# Patient Record
Sex: Male | Born: 1945 | Race: White | Hispanic: No | Marital: Married | Smoking: Former smoker
Health system: Southern US, Community
[De-identification: ages and names within clinical notes are randomized; demographics above are authoritative.]

## PROBLEM LIST (undated history)

## (undated) DIAGNOSIS — C801 Malignant (primary) neoplasm, unspecified: Secondary | ICD-10-CM

## (undated) DIAGNOSIS — G4700 Insomnia, unspecified: Secondary | ICD-10-CM

## (undated) DIAGNOSIS — F319 Bipolar disorder, unspecified: Secondary | ICD-10-CM

## (undated) DIAGNOSIS — F329 Major depressive disorder, single episode, unspecified: Secondary | ICD-10-CM

## (undated) DIAGNOSIS — R531 Weakness: Secondary | ICD-10-CM

## (undated) DIAGNOSIS — K5900 Constipation, unspecified: Secondary | ICD-10-CM

## (undated) DIAGNOSIS — K449 Diaphragmatic hernia without obstruction or gangrene: Secondary | ICD-10-CM

## (undated) DIAGNOSIS — G629 Polyneuropathy, unspecified: Secondary | ICD-10-CM

## (undated) DIAGNOSIS — R251 Tremor, unspecified: Secondary | ICD-10-CM

## (undated) DIAGNOSIS — R41841 Cognitive communication deficit: Secondary | ICD-10-CM

## (undated) DIAGNOSIS — F32A Depression, unspecified: Secondary | ICD-10-CM

## (undated) DIAGNOSIS — M75100 Unspecified rotator cuff tear or rupture of unspecified shoulder, not specified as traumatic: Secondary | ICD-10-CM

## (undated) DIAGNOSIS — J449 Chronic obstructive pulmonary disease, unspecified: Secondary | ICD-10-CM

## (undated) DIAGNOSIS — F419 Anxiety disorder, unspecified: Secondary | ICD-10-CM

## (undated) DIAGNOSIS — B1920 Unspecified viral hepatitis C without hepatic coma: Secondary | ICD-10-CM

## (undated) DIAGNOSIS — J45909 Unspecified asthma, uncomplicated: Secondary | ICD-10-CM

## (undated) DIAGNOSIS — E559 Vitamin D deficiency, unspecified: Secondary | ICD-10-CM

## (undated) DIAGNOSIS — Z8619 Personal history of other infectious and parasitic diseases: Secondary | ICD-10-CM

## (undated) DIAGNOSIS — M1990 Unspecified osteoarthritis, unspecified site: Secondary | ICD-10-CM

## (undated) DIAGNOSIS — E785 Hyperlipidemia, unspecified: Secondary | ICD-10-CM

## (undated) DIAGNOSIS — H9190 Unspecified hearing loss, unspecified ear: Secondary | ICD-10-CM

## (undated) DIAGNOSIS — I10 Essential (primary) hypertension: Secondary | ICD-10-CM

## (undated) DIAGNOSIS — E119 Type 2 diabetes mellitus without complications: Secondary | ICD-10-CM

## (undated) DIAGNOSIS — N138 Other obstructive and reflux uropathy: Secondary | ICD-10-CM

## (undated) DIAGNOSIS — N401 Enlarged prostate with lower urinary tract symptoms: Secondary | ICD-10-CM

## (undated) DIAGNOSIS — J189 Pneumonia, unspecified organism: Secondary | ICD-10-CM

## (undated) DIAGNOSIS — Z86711 Personal history of pulmonary embolism: Secondary | ICD-10-CM

## (undated) DIAGNOSIS — K219 Gastro-esophageal reflux disease without esophagitis: Secondary | ICD-10-CM

## (undated) HISTORY — PX: TONGUE SURGERY: SHX810

## (undated) HISTORY — PX: TRANSURETHRAL RESECTION OF PROSTATE: SHX73

## (undated) HISTORY — PX: ORIF FEMUR FRACTURE: SHX2119

---

## 2007-11-05 ENCOUNTER — Ambulatory Visit: Payer: Self-pay | Admitting: Cardiology

## 2011-04-16 DIAGNOSIS — L259 Unspecified contact dermatitis, unspecified cause: Secondary | ICD-10-CM | POA: Diagnosis not present

## 2011-04-16 DIAGNOSIS — J449 Chronic obstructive pulmonary disease, unspecified: Secondary | ICD-10-CM | POA: Diagnosis not present

## 2011-04-16 DIAGNOSIS — R079 Chest pain, unspecified: Secondary | ICD-10-CM | POA: Diagnosis not present

## 2011-04-25 DIAGNOSIS — R0602 Shortness of breath: Secondary | ICD-10-CM | POA: Diagnosis not present

## 2011-04-25 DIAGNOSIS — F411 Generalized anxiety disorder: Secondary | ICD-10-CM | POA: Diagnosis not present

## 2011-04-25 DIAGNOSIS — R5381 Other malaise: Secondary | ICD-10-CM | POA: Diagnosis not present

## 2011-04-27 DIAGNOSIS — R0602 Shortness of breath: Secondary | ICD-10-CM | POA: Diagnosis not present

## 2011-04-27 DIAGNOSIS — F329 Major depressive disorder, single episode, unspecified: Secondary | ICD-10-CM | POA: Diagnosis not present

## 2011-04-27 DIAGNOSIS — J189 Pneumonia, unspecified organism: Secondary | ICD-10-CM | POA: Diagnosis not present

## 2011-04-27 DIAGNOSIS — R5383 Other fatigue: Secondary | ICD-10-CM | POA: Diagnosis not present

## 2011-04-27 DIAGNOSIS — R5381 Other malaise: Secondary | ICD-10-CM | POA: Diagnosis not present

## 2011-06-18 DIAGNOSIS — E782 Mixed hyperlipidemia: Secondary | ICD-10-CM | POA: Diagnosis not present

## 2011-06-18 DIAGNOSIS — J449 Chronic obstructive pulmonary disease, unspecified: Secondary | ICD-10-CM | POA: Diagnosis not present

## 2011-06-18 DIAGNOSIS — L240 Irritant contact dermatitis due to detergents: Secondary | ICD-10-CM | POA: Diagnosis not present

## 2011-06-18 DIAGNOSIS — I824Z9 Acute embolism and thrombosis of unspecified deep veins of unspecified distal lower extremity: Secondary | ICD-10-CM | POA: Diagnosis not present

## 2011-07-07 DIAGNOSIS — L738 Other specified follicular disorders: Secondary | ICD-10-CM | POA: Diagnosis present

## 2011-07-07 DIAGNOSIS — I509 Heart failure, unspecified: Secondary | ICD-10-CM | POA: Diagnosis present

## 2011-07-07 DIAGNOSIS — Z85810 Personal history of malignant neoplasm of tongue: Secondary | ICD-10-CM | POA: Diagnosis not present

## 2011-07-07 DIAGNOSIS — Z9981 Dependence on supplemental oxygen: Secondary | ICD-10-CM | POA: Diagnosis not present

## 2011-07-07 DIAGNOSIS — E119 Type 2 diabetes mellitus without complications: Secondary | ICD-10-CM | POA: Diagnosis present

## 2011-07-07 DIAGNOSIS — N400 Enlarged prostate without lower urinary tract symptoms: Secondary | ICD-10-CM | POA: Diagnosis not present

## 2011-07-07 DIAGNOSIS — Z86711 Personal history of pulmonary embolism: Secondary | ICD-10-CM | POA: Diagnosis not present

## 2011-07-07 DIAGNOSIS — I10 Essential (primary) hypertension: Secondary | ICD-10-CM | POA: Diagnosis not present

## 2011-07-07 DIAGNOSIS — Z87891 Personal history of nicotine dependence: Secondary | ICD-10-CM | POA: Diagnosis not present

## 2011-07-07 DIAGNOSIS — E785 Hyperlipidemia, unspecified: Secondary | ICD-10-CM | POA: Diagnosis present

## 2011-07-07 DIAGNOSIS — J984 Other disorders of lung: Secondary | ICD-10-CM | POA: Diagnosis not present

## 2011-07-07 DIAGNOSIS — I2782 Chronic pulmonary embolism: Secondary | ICD-10-CM | POA: Diagnosis not present

## 2011-07-07 DIAGNOSIS — Z9119 Patient's noncompliance with other medical treatment and regimen: Secondary | ICD-10-CM | POA: Diagnosis not present

## 2011-07-07 DIAGNOSIS — K449 Diaphragmatic hernia without obstruction or gangrene: Secondary | ICD-10-CM | POA: Diagnosis present

## 2011-07-07 DIAGNOSIS — J441 Chronic obstructive pulmonary disease with (acute) exacerbation: Secondary | ICD-10-CM | POA: Diagnosis not present

## 2011-07-07 DIAGNOSIS — R0602 Shortness of breath: Secondary | ICD-10-CM | POA: Diagnosis not present

## 2011-07-07 DIAGNOSIS — B370 Candidal stomatitis: Secondary | ICD-10-CM | POA: Diagnosis not present

## 2011-07-07 DIAGNOSIS — Z79899 Other long term (current) drug therapy: Secondary | ICD-10-CM | POA: Diagnosis not present

## 2011-07-07 DIAGNOSIS — F411 Generalized anxiety disorder: Secondary | ICD-10-CM | POA: Diagnosis not present

## 2011-07-07 DIAGNOSIS — R5381 Other malaise: Secondary | ICD-10-CM | POA: Diagnosis present

## 2011-07-07 DIAGNOSIS — R918 Other nonspecific abnormal finding of lung field: Secondary | ICD-10-CM | POA: Diagnosis not present

## 2011-07-07 DIAGNOSIS — F319 Bipolar disorder, unspecified: Secondary | ICD-10-CM | POA: Diagnosis present

## 2011-07-14 DIAGNOSIS — F411 Generalized anxiety disorder: Secondary | ICD-10-CM | POA: Diagnosis not present

## 2011-07-14 DIAGNOSIS — J441 Chronic obstructive pulmonary disease with (acute) exacerbation: Secondary | ICD-10-CM | POA: Diagnosis not present

## 2011-07-14 DIAGNOSIS — J984 Other disorders of lung: Secondary | ICD-10-CM | POA: Diagnosis not present

## 2011-07-14 DIAGNOSIS — R0602 Shortness of breath: Secondary | ICD-10-CM | POA: Diagnosis not present

## 2011-07-16 DIAGNOSIS — J449 Chronic obstructive pulmonary disease, unspecified: Secondary | ICD-10-CM | POA: Diagnosis not present

## 2011-07-17 DIAGNOSIS — I10 Essential (primary) hypertension: Secondary | ICD-10-CM | POA: Diagnosis not present

## 2011-07-17 DIAGNOSIS — S51809A Unspecified open wound of unspecified forearm, initial encounter: Secondary | ICD-10-CM | POA: Diagnosis not present

## 2011-07-17 DIAGNOSIS — E119 Type 2 diabetes mellitus without complications: Secondary | ICD-10-CM | POA: Diagnosis not present

## 2011-07-17 DIAGNOSIS — I2699 Other pulmonary embolism without acute cor pulmonale: Secondary | ICD-10-CM | POA: Diagnosis not present

## 2011-07-17 DIAGNOSIS — F319 Bipolar disorder, unspecified: Secondary | ICD-10-CM | POA: Diagnosis not present

## 2011-07-17 DIAGNOSIS — J441 Chronic obstructive pulmonary disease with (acute) exacerbation: Secondary | ICD-10-CM | POA: Diagnosis not present

## 2011-07-20 DIAGNOSIS — I2699 Other pulmonary embolism without acute cor pulmonale: Secondary | ICD-10-CM | POA: Diagnosis not present

## 2011-07-20 DIAGNOSIS — F319 Bipolar disorder, unspecified: Secondary | ICD-10-CM | POA: Diagnosis not present

## 2011-07-20 DIAGNOSIS — I10 Essential (primary) hypertension: Secondary | ICD-10-CM | POA: Diagnosis not present

## 2011-07-20 DIAGNOSIS — S51809A Unspecified open wound of unspecified forearm, initial encounter: Secondary | ICD-10-CM | POA: Diagnosis not present

## 2011-07-20 DIAGNOSIS — J441 Chronic obstructive pulmonary disease with (acute) exacerbation: Secondary | ICD-10-CM | POA: Diagnosis not present

## 2011-07-20 DIAGNOSIS — E119 Type 2 diabetes mellitus without complications: Secondary | ICD-10-CM | POA: Diagnosis not present

## 2011-07-22 DIAGNOSIS — I2699 Other pulmonary embolism without acute cor pulmonale: Secondary | ICD-10-CM | POA: Diagnosis not present

## 2011-07-22 DIAGNOSIS — I10 Essential (primary) hypertension: Secondary | ICD-10-CM | POA: Diagnosis not present

## 2011-07-22 DIAGNOSIS — S51809A Unspecified open wound of unspecified forearm, initial encounter: Secondary | ICD-10-CM | POA: Diagnosis not present

## 2011-07-22 DIAGNOSIS — J441 Chronic obstructive pulmonary disease with (acute) exacerbation: Secondary | ICD-10-CM | POA: Diagnosis not present

## 2011-07-22 DIAGNOSIS — E119 Type 2 diabetes mellitus without complications: Secondary | ICD-10-CM | POA: Diagnosis not present

## 2011-07-22 DIAGNOSIS — F319 Bipolar disorder, unspecified: Secondary | ICD-10-CM | POA: Diagnosis not present

## 2011-07-25 DIAGNOSIS — I2699 Other pulmonary embolism without acute cor pulmonale: Secondary | ICD-10-CM | POA: Diagnosis not present

## 2011-07-25 DIAGNOSIS — I10 Essential (primary) hypertension: Secondary | ICD-10-CM | POA: Diagnosis not present

## 2011-07-25 DIAGNOSIS — F319 Bipolar disorder, unspecified: Secondary | ICD-10-CM | POA: Diagnosis not present

## 2011-07-25 DIAGNOSIS — S51809A Unspecified open wound of unspecified forearm, initial encounter: Secondary | ICD-10-CM | POA: Diagnosis not present

## 2011-07-25 DIAGNOSIS — J441 Chronic obstructive pulmonary disease with (acute) exacerbation: Secondary | ICD-10-CM | POA: Diagnosis not present

## 2011-07-25 DIAGNOSIS — E119 Type 2 diabetes mellitus without complications: Secondary | ICD-10-CM | POA: Diagnosis not present

## 2011-07-29 DIAGNOSIS — I2699 Other pulmonary embolism without acute cor pulmonale: Secondary | ICD-10-CM | POA: Diagnosis not present

## 2011-07-29 DIAGNOSIS — F319 Bipolar disorder, unspecified: Secondary | ICD-10-CM | POA: Diagnosis not present

## 2011-07-29 DIAGNOSIS — S51809A Unspecified open wound of unspecified forearm, initial encounter: Secondary | ICD-10-CM | POA: Diagnosis not present

## 2011-07-29 DIAGNOSIS — E119 Type 2 diabetes mellitus without complications: Secondary | ICD-10-CM | POA: Diagnosis not present

## 2011-07-29 DIAGNOSIS — J441 Chronic obstructive pulmonary disease with (acute) exacerbation: Secondary | ICD-10-CM | POA: Diagnosis not present

## 2011-07-29 DIAGNOSIS — I10 Essential (primary) hypertension: Secondary | ICD-10-CM | POA: Diagnosis not present

## 2011-08-01 DIAGNOSIS — E119 Type 2 diabetes mellitus without complications: Secondary | ICD-10-CM | POA: Diagnosis not present

## 2011-08-01 DIAGNOSIS — F319 Bipolar disorder, unspecified: Secondary | ICD-10-CM | POA: Diagnosis not present

## 2011-08-01 DIAGNOSIS — I10 Essential (primary) hypertension: Secondary | ICD-10-CM | POA: Diagnosis not present

## 2011-08-01 DIAGNOSIS — J441 Chronic obstructive pulmonary disease with (acute) exacerbation: Secondary | ICD-10-CM | POA: Diagnosis not present

## 2011-08-01 DIAGNOSIS — S51809A Unspecified open wound of unspecified forearm, initial encounter: Secondary | ICD-10-CM | POA: Diagnosis not present

## 2011-08-01 DIAGNOSIS — I2699 Other pulmonary embolism without acute cor pulmonale: Secondary | ICD-10-CM | POA: Diagnosis not present

## 2011-08-05 DIAGNOSIS — S51809A Unspecified open wound of unspecified forearm, initial encounter: Secondary | ICD-10-CM | POA: Diagnosis not present

## 2011-08-05 DIAGNOSIS — F319 Bipolar disorder, unspecified: Secondary | ICD-10-CM | POA: Diagnosis not present

## 2011-08-05 DIAGNOSIS — I2699 Other pulmonary embolism without acute cor pulmonale: Secondary | ICD-10-CM | POA: Diagnosis not present

## 2011-08-05 DIAGNOSIS — I10 Essential (primary) hypertension: Secondary | ICD-10-CM | POA: Diagnosis not present

## 2011-08-05 DIAGNOSIS — J441 Chronic obstructive pulmonary disease with (acute) exacerbation: Secondary | ICD-10-CM | POA: Diagnosis not present

## 2011-08-05 DIAGNOSIS — E119 Type 2 diabetes mellitus without complications: Secondary | ICD-10-CM | POA: Diagnosis not present

## 2011-08-07 DIAGNOSIS — F319 Bipolar disorder, unspecified: Secondary | ICD-10-CM | POA: Diagnosis not present

## 2011-08-07 DIAGNOSIS — E119 Type 2 diabetes mellitus without complications: Secondary | ICD-10-CM | POA: Diagnosis not present

## 2011-08-07 DIAGNOSIS — I10 Essential (primary) hypertension: Secondary | ICD-10-CM | POA: Diagnosis not present

## 2011-08-07 DIAGNOSIS — I2699 Other pulmonary embolism without acute cor pulmonale: Secondary | ICD-10-CM | POA: Diagnosis not present

## 2011-08-07 DIAGNOSIS — S51809A Unspecified open wound of unspecified forearm, initial encounter: Secondary | ICD-10-CM | POA: Diagnosis not present

## 2011-08-07 DIAGNOSIS — J441 Chronic obstructive pulmonary disease with (acute) exacerbation: Secondary | ICD-10-CM | POA: Diagnosis not present

## 2011-08-12 DIAGNOSIS — S51809A Unspecified open wound of unspecified forearm, initial encounter: Secondary | ICD-10-CM | POA: Diagnosis not present

## 2011-08-12 DIAGNOSIS — J441 Chronic obstructive pulmonary disease with (acute) exacerbation: Secondary | ICD-10-CM | POA: Diagnosis not present

## 2011-08-12 DIAGNOSIS — F319 Bipolar disorder, unspecified: Secondary | ICD-10-CM | POA: Diagnosis not present

## 2011-08-12 DIAGNOSIS — I2699 Other pulmonary embolism without acute cor pulmonale: Secondary | ICD-10-CM | POA: Diagnosis not present

## 2011-08-12 DIAGNOSIS — E119 Type 2 diabetes mellitus without complications: Secondary | ICD-10-CM | POA: Diagnosis not present

## 2011-08-12 DIAGNOSIS — I10 Essential (primary) hypertension: Secondary | ICD-10-CM | POA: Diagnosis not present

## 2011-08-14 DIAGNOSIS — F319 Bipolar disorder, unspecified: Secondary | ICD-10-CM | POA: Diagnosis not present

## 2011-08-14 DIAGNOSIS — I10 Essential (primary) hypertension: Secondary | ICD-10-CM | POA: Diagnosis not present

## 2011-08-14 DIAGNOSIS — E119 Type 2 diabetes mellitus without complications: Secondary | ICD-10-CM | POA: Diagnosis not present

## 2011-08-14 DIAGNOSIS — J441 Chronic obstructive pulmonary disease with (acute) exacerbation: Secondary | ICD-10-CM | POA: Diagnosis not present

## 2011-08-14 DIAGNOSIS — S51809A Unspecified open wound of unspecified forearm, initial encounter: Secondary | ICD-10-CM | POA: Diagnosis not present

## 2011-08-14 DIAGNOSIS — I2699 Other pulmonary embolism without acute cor pulmonale: Secondary | ICD-10-CM | POA: Diagnosis not present

## 2011-08-20 DIAGNOSIS — I2699 Other pulmonary embolism without acute cor pulmonale: Secondary | ICD-10-CM | POA: Diagnosis not present

## 2011-08-20 DIAGNOSIS — E119 Type 2 diabetes mellitus without complications: Secondary | ICD-10-CM | POA: Diagnosis not present

## 2011-08-20 DIAGNOSIS — F319 Bipolar disorder, unspecified: Secondary | ICD-10-CM | POA: Diagnosis not present

## 2011-08-20 DIAGNOSIS — S51809A Unspecified open wound of unspecified forearm, initial encounter: Secondary | ICD-10-CM | POA: Diagnosis not present

## 2011-08-20 DIAGNOSIS — J441 Chronic obstructive pulmonary disease with (acute) exacerbation: Secondary | ICD-10-CM | POA: Diagnosis not present

## 2011-08-20 DIAGNOSIS — I10 Essential (primary) hypertension: Secondary | ICD-10-CM | POA: Diagnosis not present

## 2011-08-21 DIAGNOSIS — F319 Bipolar disorder, unspecified: Secondary | ICD-10-CM | POA: Diagnosis not present

## 2011-08-21 DIAGNOSIS — I10 Essential (primary) hypertension: Secondary | ICD-10-CM | POA: Diagnosis not present

## 2011-08-21 DIAGNOSIS — E119 Type 2 diabetes mellitus without complications: Secondary | ICD-10-CM | POA: Diagnosis not present

## 2011-08-21 DIAGNOSIS — S51809A Unspecified open wound of unspecified forearm, initial encounter: Secondary | ICD-10-CM | POA: Diagnosis not present

## 2011-08-21 DIAGNOSIS — J441 Chronic obstructive pulmonary disease with (acute) exacerbation: Secondary | ICD-10-CM | POA: Diagnosis not present

## 2011-08-21 DIAGNOSIS — I2699 Other pulmonary embolism without acute cor pulmonale: Secondary | ICD-10-CM | POA: Diagnosis not present

## 2011-08-23 DIAGNOSIS — F319 Bipolar disorder, unspecified: Secondary | ICD-10-CM | POA: Diagnosis not present

## 2011-08-23 DIAGNOSIS — I2699 Other pulmonary embolism without acute cor pulmonale: Secondary | ICD-10-CM | POA: Diagnosis not present

## 2011-08-23 DIAGNOSIS — I10 Essential (primary) hypertension: Secondary | ICD-10-CM | POA: Diagnosis not present

## 2011-08-23 DIAGNOSIS — S51809A Unspecified open wound of unspecified forearm, initial encounter: Secondary | ICD-10-CM | POA: Diagnosis not present

## 2011-08-23 DIAGNOSIS — E119 Type 2 diabetes mellitus without complications: Secondary | ICD-10-CM | POA: Diagnosis not present

## 2011-08-23 DIAGNOSIS — J441 Chronic obstructive pulmonary disease with (acute) exacerbation: Secondary | ICD-10-CM | POA: Diagnosis not present

## 2011-08-26 DIAGNOSIS — J441 Chronic obstructive pulmonary disease with (acute) exacerbation: Secondary | ICD-10-CM | POA: Diagnosis not present

## 2011-08-26 DIAGNOSIS — F319 Bipolar disorder, unspecified: Secondary | ICD-10-CM | POA: Diagnosis not present

## 2011-08-26 DIAGNOSIS — E119 Type 2 diabetes mellitus without complications: Secondary | ICD-10-CM | POA: Diagnosis not present

## 2011-08-26 DIAGNOSIS — I10 Essential (primary) hypertension: Secondary | ICD-10-CM | POA: Diagnosis not present

## 2011-08-26 DIAGNOSIS — S51809A Unspecified open wound of unspecified forearm, initial encounter: Secondary | ICD-10-CM | POA: Diagnosis not present

## 2011-08-26 DIAGNOSIS — I2699 Other pulmonary embolism without acute cor pulmonale: Secondary | ICD-10-CM | POA: Diagnosis not present

## 2011-08-29 DIAGNOSIS — F319 Bipolar disorder, unspecified: Secondary | ICD-10-CM | POA: Diagnosis not present

## 2011-08-29 DIAGNOSIS — I2699 Other pulmonary embolism without acute cor pulmonale: Secondary | ICD-10-CM | POA: Diagnosis not present

## 2011-08-29 DIAGNOSIS — I10 Essential (primary) hypertension: Secondary | ICD-10-CM | POA: Diagnosis not present

## 2011-08-29 DIAGNOSIS — E119 Type 2 diabetes mellitus without complications: Secondary | ICD-10-CM | POA: Diagnosis not present

## 2011-08-29 DIAGNOSIS — S51809A Unspecified open wound of unspecified forearm, initial encounter: Secondary | ICD-10-CM | POA: Diagnosis not present

## 2011-08-29 DIAGNOSIS — J441 Chronic obstructive pulmonary disease with (acute) exacerbation: Secondary | ICD-10-CM | POA: Diagnosis not present

## 2011-08-31 DIAGNOSIS — K449 Diaphragmatic hernia without obstruction or gangrene: Secondary | ICD-10-CM | POA: Diagnosis not present

## 2011-08-31 DIAGNOSIS — I509 Heart failure, unspecified: Secondary | ICD-10-CM | POA: Diagnosis present

## 2011-08-31 DIAGNOSIS — I2789 Other specified pulmonary heart diseases: Secondary | ICD-10-CM | POA: Diagnosis not present

## 2011-08-31 DIAGNOSIS — R0789 Other chest pain: Secondary | ICD-10-CM | POA: Diagnosis not present

## 2011-08-31 DIAGNOSIS — Z79899 Other long term (current) drug therapy: Secondary | ICD-10-CM | POA: Diagnosis not present

## 2011-08-31 DIAGNOSIS — Z87891 Personal history of nicotine dependence: Secondary | ICD-10-CM | POA: Diagnosis not present

## 2011-08-31 DIAGNOSIS — E119 Type 2 diabetes mellitus without complications: Secondary | ICD-10-CM | POA: Diagnosis present

## 2011-08-31 DIAGNOSIS — N400 Enlarged prostate without lower urinary tract symptoms: Secondary | ICD-10-CM | POA: Diagnosis present

## 2011-08-31 DIAGNOSIS — J449 Chronic obstructive pulmonary disease, unspecified: Secondary | ICD-10-CM | POA: Diagnosis not present

## 2011-08-31 DIAGNOSIS — Z91013 Allergy to seafood: Secondary | ICD-10-CM | POA: Diagnosis not present

## 2011-08-31 DIAGNOSIS — R0602 Shortness of breath: Secondary | ICD-10-CM | POA: Diagnosis not present

## 2011-08-31 DIAGNOSIS — J441 Chronic obstructive pulmonary disease with (acute) exacerbation: Secondary | ICD-10-CM | POA: Diagnosis not present

## 2011-08-31 DIAGNOSIS — F411 Generalized anxiety disorder: Secondary | ICD-10-CM | POA: Diagnosis not present

## 2011-08-31 DIAGNOSIS — I10 Essential (primary) hypertension: Secondary | ICD-10-CM | POA: Diagnosis present

## 2011-08-31 DIAGNOSIS — L678 Other hair color and hair shaft abnormalities: Secondary | ICD-10-CM | POA: Diagnosis present

## 2011-08-31 DIAGNOSIS — B353 Tinea pedis: Secondary | ICD-10-CM | POA: Diagnosis not present

## 2011-08-31 DIAGNOSIS — Z888 Allergy status to other drugs, medicaments and biological substances status: Secondary | ICD-10-CM | POA: Diagnosis not present

## 2011-08-31 DIAGNOSIS — L738 Other specified follicular disorders: Secondary | ICD-10-CM | POA: Diagnosis not present

## 2011-08-31 DIAGNOSIS — J4489 Other specified chronic obstructive pulmonary disease: Secondary | ICD-10-CM | POA: Diagnosis not present

## 2011-08-31 DIAGNOSIS — J984 Other disorders of lung: Secondary | ICD-10-CM | POA: Diagnosis not present

## 2011-08-31 DIAGNOSIS — M79609 Pain in unspecified limb: Secondary | ICD-10-CM | POA: Diagnosis not present

## 2011-08-31 DIAGNOSIS — F319 Bipolar disorder, unspecified: Secondary | ICD-10-CM | POA: Diagnosis present

## 2011-08-31 DIAGNOSIS — Z86711 Personal history of pulmonary embolism: Secondary | ICD-10-CM | POA: Diagnosis not present

## 2011-08-31 DIAGNOSIS — E785 Hyperlipidemia, unspecified: Secondary | ICD-10-CM | POA: Diagnosis present

## 2011-09-05 DIAGNOSIS — J441 Chronic obstructive pulmonary disease with (acute) exacerbation: Secondary | ICD-10-CM | POA: Diagnosis not present

## 2011-09-05 DIAGNOSIS — I10 Essential (primary) hypertension: Secondary | ICD-10-CM | POA: Diagnosis not present

## 2011-09-05 DIAGNOSIS — S51809A Unspecified open wound of unspecified forearm, initial encounter: Secondary | ICD-10-CM | POA: Diagnosis not present

## 2011-09-05 DIAGNOSIS — I2699 Other pulmonary embolism without acute cor pulmonale: Secondary | ICD-10-CM | POA: Diagnosis not present

## 2011-09-05 DIAGNOSIS — F319 Bipolar disorder, unspecified: Secondary | ICD-10-CM | POA: Diagnosis not present

## 2011-09-05 DIAGNOSIS — E119 Type 2 diabetes mellitus without complications: Secondary | ICD-10-CM | POA: Diagnosis not present

## 2011-09-07 DIAGNOSIS — I2699 Other pulmonary embolism without acute cor pulmonale: Secondary | ICD-10-CM | POA: Diagnosis not present

## 2011-09-07 DIAGNOSIS — S51809A Unspecified open wound of unspecified forearm, initial encounter: Secondary | ICD-10-CM | POA: Diagnosis not present

## 2011-09-07 DIAGNOSIS — J441 Chronic obstructive pulmonary disease with (acute) exacerbation: Secondary | ICD-10-CM | POA: Diagnosis not present

## 2011-09-07 DIAGNOSIS — I10 Essential (primary) hypertension: Secondary | ICD-10-CM | POA: Diagnosis not present

## 2011-09-07 DIAGNOSIS — E119 Type 2 diabetes mellitus without complications: Secondary | ICD-10-CM | POA: Diagnosis not present

## 2011-09-07 DIAGNOSIS — F319 Bipolar disorder, unspecified: Secondary | ICD-10-CM | POA: Diagnosis not present

## 2011-09-11 DIAGNOSIS — E119 Type 2 diabetes mellitus without complications: Secondary | ICD-10-CM | POA: Diagnosis not present

## 2011-09-11 DIAGNOSIS — I2699 Other pulmonary embolism without acute cor pulmonale: Secondary | ICD-10-CM | POA: Diagnosis not present

## 2011-09-11 DIAGNOSIS — F319 Bipolar disorder, unspecified: Secondary | ICD-10-CM | POA: Diagnosis not present

## 2011-09-11 DIAGNOSIS — I10 Essential (primary) hypertension: Secondary | ICD-10-CM | POA: Diagnosis not present

## 2011-09-11 DIAGNOSIS — J441 Chronic obstructive pulmonary disease with (acute) exacerbation: Secondary | ICD-10-CM | POA: Diagnosis not present

## 2011-09-11 DIAGNOSIS — S51809A Unspecified open wound of unspecified forearm, initial encounter: Secondary | ICD-10-CM | POA: Diagnosis not present

## 2011-09-14 DIAGNOSIS — I2699 Other pulmonary embolism without acute cor pulmonale: Secondary | ICD-10-CM | POA: Diagnosis not present

## 2011-09-14 DIAGNOSIS — J441 Chronic obstructive pulmonary disease with (acute) exacerbation: Secondary | ICD-10-CM | POA: Diagnosis not present

## 2011-09-14 DIAGNOSIS — I10 Essential (primary) hypertension: Secondary | ICD-10-CM | POA: Diagnosis not present

## 2011-09-14 DIAGNOSIS — F319 Bipolar disorder, unspecified: Secondary | ICD-10-CM | POA: Diagnosis not present

## 2011-09-14 DIAGNOSIS — S51809A Unspecified open wound of unspecified forearm, initial encounter: Secondary | ICD-10-CM | POA: Diagnosis not present

## 2011-09-14 DIAGNOSIS — E119 Type 2 diabetes mellitus without complications: Secondary | ICD-10-CM | POA: Diagnosis not present

## 2011-09-15 DIAGNOSIS — I2699 Other pulmonary embolism without acute cor pulmonale: Secondary | ICD-10-CM | POA: Diagnosis not present

## 2011-09-15 DIAGNOSIS — J441 Chronic obstructive pulmonary disease with (acute) exacerbation: Secondary | ICD-10-CM | POA: Diagnosis not present

## 2011-09-15 DIAGNOSIS — I10 Essential (primary) hypertension: Secondary | ICD-10-CM | POA: Diagnosis not present

## 2011-09-15 DIAGNOSIS — F319 Bipolar disorder, unspecified: Secondary | ICD-10-CM | POA: Diagnosis not present

## 2011-09-15 DIAGNOSIS — E119 Type 2 diabetes mellitus without complications: Secondary | ICD-10-CM | POA: Diagnosis not present

## 2011-09-15 DIAGNOSIS — Z9981 Dependence on supplemental oxygen: Secondary | ICD-10-CM | POA: Diagnosis not present

## 2011-09-17 DIAGNOSIS — E119 Type 2 diabetes mellitus without complications: Secondary | ICD-10-CM | POA: Diagnosis not present

## 2011-09-17 DIAGNOSIS — I2699 Other pulmonary embolism without acute cor pulmonale: Secondary | ICD-10-CM | POA: Diagnosis not present

## 2011-09-17 DIAGNOSIS — I10 Essential (primary) hypertension: Secondary | ICD-10-CM | POA: Diagnosis not present

## 2011-09-17 DIAGNOSIS — F319 Bipolar disorder, unspecified: Secondary | ICD-10-CM | POA: Diagnosis not present

## 2011-09-17 DIAGNOSIS — Z9981 Dependence on supplemental oxygen: Secondary | ICD-10-CM | POA: Diagnosis not present

## 2011-09-17 DIAGNOSIS — J441 Chronic obstructive pulmonary disease with (acute) exacerbation: Secondary | ICD-10-CM | POA: Diagnosis not present

## 2011-09-19 DIAGNOSIS — J449 Chronic obstructive pulmonary disease, unspecified: Secondary | ICD-10-CM | POA: Diagnosis not present

## 2011-09-24 DIAGNOSIS — J441 Chronic obstructive pulmonary disease with (acute) exacerbation: Secondary | ICD-10-CM | POA: Diagnosis not present

## 2011-09-24 DIAGNOSIS — I2699 Other pulmonary embolism without acute cor pulmonale: Secondary | ICD-10-CM | POA: Diagnosis not present

## 2011-09-24 DIAGNOSIS — E119 Type 2 diabetes mellitus without complications: Secondary | ICD-10-CM | POA: Diagnosis not present

## 2011-09-24 DIAGNOSIS — Z9981 Dependence on supplemental oxygen: Secondary | ICD-10-CM | POA: Diagnosis not present

## 2011-09-24 DIAGNOSIS — I10 Essential (primary) hypertension: Secondary | ICD-10-CM | POA: Diagnosis not present

## 2011-09-24 DIAGNOSIS — F319 Bipolar disorder, unspecified: Secondary | ICD-10-CM | POA: Diagnosis not present

## 2011-09-26 DIAGNOSIS — I2699 Other pulmonary embolism without acute cor pulmonale: Secondary | ICD-10-CM | POA: Diagnosis not present

## 2011-09-26 DIAGNOSIS — F319 Bipolar disorder, unspecified: Secondary | ICD-10-CM | POA: Diagnosis not present

## 2011-09-26 DIAGNOSIS — J441 Chronic obstructive pulmonary disease with (acute) exacerbation: Secondary | ICD-10-CM | POA: Diagnosis not present

## 2011-09-26 DIAGNOSIS — Z9981 Dependence on supplemental oxygen: Secondary | ICD-10-CM | POA: Diagnosis not present

## 2011-09-26 DIAGNOSIS — I10 Essential (primary) hypertension: Secondary | ICD-10-CM | POA: Diagnosis not present

## 2011-09-26 DIAGNOSIS — E119 Type 2 diabetes mellitus without complications: Secondary | ICD-10-CM | POA: Diagnosis not present

## 2011-10-03 DIAGNOSIS — I2699 Other pulmonary embolism without acute cor pulmonale: Secondary | ICD-10-CM | POA: Diagnosis not present

## 2011-10-03 DIAGNOSIS — E119 Type 2 diabetes mellitus without complications: Secondary | ICD-10-CM | POA: Diagnosis not present

## 2011-10-03 DIAGNOSIS — Z9981 Dependence on supplemental oxygen: Secondary | ICD-10-CM | POA: Diagnosis not present

## 2011-10-03 DIAGNOSIS — I10 Essential (primary) hypertension: Secondary | ICD-10-CM | POA: Diagnosis not present

## 2011-10-03 DIAGNOSIS — J441 Chronic obstructive pulmonary disease with (acute) exacerbation: Secondary | ICD-10-CM | POA: Diagnosis not present

## 2011-10-03 DIAGNOSIS — F319 Bipolar disorder, unspecified: Secondary | ICD-10-CM | POA: Diagnosis not present

## 2011-10-08 DIAGNOSIS — I2699 Other pulmonary embolism without acute cor pulmonale: Secondary | ICD-10-CM | POA: Diagnosis not present

## 2011-10-08 DIAGNOSIS — J441 Chronic obstructive pulmonary disease with (acute) exacerbation: Secondary | ICD-10-CM | POA: Diagnosis not present

## 2011-10-08 DIAGNOSIS — Z9981 Dependence on supplemental oxygen: Secondary | ICD-10-CM | POA: Diagnosis not present

## 2011-10-08 DIAGNOSIS — I10 Essential (primary) hypertension: Secondary | ICD-10-CM | POA: Diagnosis not present

## 2011-10-08 DIAGNOSIS — E119 Type 2 diabetes mellitus without complications: Secondary | ICD-10-CM | POA: Diagnosis not present

## 2011-10-08 DIAGNOSIS — F319 Bipolar disorder, unspecified: Secondary | ICD-10-CM | POA: Diagnosis not present

## 2011-10-09 DIAGNOSIS — L259 Unspecified contact dermatitis, unspecified cause: Secondary | ICD-10-CM | POA: Diagnosis not present

## 2011-10-10 DIAGNOSIS — Z87891 Personal history of nicotine dependence: Secondary | ICD-10-CM | POA: Diagnosis not present

## 2011-10-10 DIAGNOSIS — E119 Type 2 diabetes mellitus without complications: Secondary | ICD-10-CM | POA: Diagnosis not present

## 2011-10-10 DIAGNOSIS — J984 Other disorders of lung: Secondary | ICD-10-CM | POA: Diagnosis not present

## 2011-10-10 DIAGNOSIS — I10 Essential (primary) hypertension: Secondary | ICD-10-CM | POA: Diagnosis not present

## 2011-10-10 DIAGNOSIS — J449 Chronic obstructive pulmonary disease, unspecified: Secondary | ICD-10-CM | POA: Diagnosis not present

## 2011-10-10 DIAGNOSIS — Z79899 Other long term (current) drug therapy: Secondary | ICD-10-CM | POA: Diagnosis not present

## 2011-10-10 DIAGNOSIS — I509 Heart failure, unspecified: Secondary | ICD-10-CM | POA: Diagnosis not present

## 2011-10-10 DIAGNOSIS — R0602 Shortness of breath: Secondary | ICD-10-CM | POA: Diagnosis not present

## 2011-10-15 DIAGNOSIS — E119 Type 2 diabetes mellitus without complications: Secondary | ICD-10-CM | POA: Diagnosis not present

## 2011-10-15 DIAGNOSIS — I2699 Other pulmonary embolism without acute cor pulmonale: Secondary | ICD-10-CM | POA: Diagnosis not present

## 2011-10-15 DIAGNOSIS — I10 Essential (primary) hypertension: Secondary | ICD-10-CM | POA: Diagnosis not present

## 2011-10-15 DIAGNOSIS — F319 Bipolar disorder, unspecified: Secondary | ICD-10-CM | POA: Diagnosis not present

## 2011-10-15 DIAGNOSIS — Z9981 Dependence on supplemental oxygen: Secondary | ICD-10-CM | POA: Diagnosis not present

## 2011-10-15 DIAGNOSIS — J441 Chronic obstructive pulmonary disease with (acute) exacerbation: Secondary | ICD-10-CM | POA: Diagnosis not present

## 2011-10-22 DIAGNOSIS — Z79899 Other long term (current) drug therapy: Secondary | ICD-10-CM | POA: Diagnosis not present

## 2011-10-22 DIAGNOSIS — E782 Mixed hyperlipidemia: Secondary | ICD-10-CM | POA: Diagnosis not present

## 2011-10-31 DIAGNOSIS — R0602 Shortness of breath: Secondary | ICD-10-CM | POA: Diagnosis not present

## 2011-10-31 DIAGNOSIS — K449 Diaphragmatic hernia without obstruction or gangrene: Secondary | ICD-10-CM | POA: Diagnosis not present

## 2011-10-31 DIAGNOSIS — J441 Chronic obstructive pulmonary disease with (acute) exacerbation: Secondary | ICD-10-CM | POA: Diagnosis not present

## 2011-10-31 DIAGNOSIS — F319 Bipolar disorder, unspecified: Secondary | ICD-10-CM | POA: Diagnosis not present

## 2011-10-31 DIAGNOSIS — J4489 Other specified chronic obstructive pulmonary disease: Secondary | ICD-10-CM | POA: Diagnosis not present

## 2011-10-31 DIAGNOSIS — R918 Other nonspecific abnormal finding of lung field: Secondary | ICD-10-CM | POA: Diagnosis not present

## 2011-10-31 DIAGNOSIS — J449 Chronic obstructive pulmonary disease, unspecified: Secondary | ICD-10-CM

## 2011-10-31 DIAGNOSIS — Z86711 Personal history of pulmonary embolism: Secondary | ICD-10-CM | POA: Diagnosis not present

## 2011-10-31 DIAGNOSIS — F29 Unspecified psychosis not due to a substance or known physiological condition: Secondary | ICD-10-CM | POA: Diagnosis not present

## 2011-10-31 DIAGNOSIS — F411 Generalized anxiety disorder: Secondary | ICD-10-CM | POA: Diagnosis not present

## 2011-11-01 DIAGNOSIS — K5900 Constipation, unspecified: Secondary | ICD-10-CM | POA: Diagnosis present

## 2011-11-01 DIAGNOSIS — J441 Chronic obstructive pulmonary disease with (acute) exacerbation: Secondary | ICD-10-CM | POA: Diagnosis present

## 2011-11-01 DIAGNOSIS — J449 Chronic obstructive pulmonary disease, unspecified: Secondary | ICD-10-CM | POA: Diagnosis not present

## 2011-11-01 DIAGNOSIS — Z87891 Personal history of nicotine dependence: Secondary | ICD-10-CM | POA: Diagnosis not present

## 2011-11-01 DIAGNOSIS — Z91013 Allergy to seafood: Secondary | ICD-10-CM | POA: Diagnosis not present

## 2011-11-01 DIAGNOSIS — R0602 Shortness of breath: Secondary | ICD-10-CM | POA: Diagnosis not present

## 2011-11-01 DIAGNOSIS — I509 Heart failure, unspecified: Secondary | ICD-10-CM | POA: Diagnosis present

## 2011-11-01 DIAGNOSIS — Z794 Long term (current) use of insulin: Secondary | ICD-10-CM | POA: Diagnosis not present

## 2011-11-01 DIAGNOSIS — Z86711 Personal history of pulmonary embolism: Secondary | ICD-10-CM | POA: Diagnosis not present

## 2011-11-01 DIAGNOSIS — Z888 Allergy status to other drugs, medicaments and biological substances status: Secondary | ICD-10-CM | POA: Diagnosis not present

## 2011-11-01 DIAGNOSIS — F319 Bipolar disorder, unspecified: Secondary | ICD-10-CM | POA: Diagnosis present

## 2011-11-01 DIAGNOSIS — E119 Type 2 diabetes mellitus without complications: Secondary | ICD-10-CM | POA: Diagnosis present

## 2011-11-01 DIAGNOSIS — R252 Cramp and spasm: Secondary | ICD-10-CM | POA: Diagnosis present

## 2011-11-01 DIAGNOSIS — R918 Other nonspecific abnormal finding of lung field: Secondary | ICD-10-CM | POA: Diagnosis not present

## 2011-11-01 DIAGNOSIS — E785 Hyperlipidemia, unspecified: Secondary | ICD-10-CM | POA: Diagnosis not present

## 2011-11-01 DIAGNOSIS — N400 Enlarged prostate without lower urinary tract symptoms: Secondary | ICD-10-CM | POA: Diagnosis present

## 2011-11-01 DIAGNOSIS — F411 Generalized anxiety disorder: Secondary | ICD-10-CM | POA: Diagnosis present

## 2011-11-01 DIAGNOSIS — Z79899 Other long term (current) drug therapy: Secondary | ICD-10-CM | POA: Diagnosis not present

## 2011-11-01 DIAGNOSIS — K449 Diaphragmatic hernia without obstruction or gangrene: Secondary | ICD-10-CM | POA: Diagnosis present

## 2011-11-07 DIAGNOSIS — J441 Chronic obstructive pulmonary disease with (acute) exacerbation: Secondary | ICD-10-CM | POA: Diagnosis not present

## 2011-11-07 DIAGNOSIS — Z9981 Dependence on supplemental oxygen: Secondary | ICD-10-CM | POA: Diagnosis not present

## 2011-11-07 DIAGNOSIS — I10 Essential (primary) hypertension: Secondary | ICD-10-CM | POA: Diagnosis not present

## 2011-11-07 DIAGNOSIS — F319 Bipolar disorder, unspecified: Secondary | ICD-10-CM | POA: Diagnosis not present

## 2011-11-07 DIAGNOSIS — I2699 Other pulmonary embolism without acute cor pulmonale: Secondary | ICD-10-CM | POA: Diagnosis not present

## 2011-11-07 DIAGNOSIS — E119 Type 2 diabetes mellitus without complications: Secondary | ICD-10-CM | POA: Diagnosis not present

## 2011-11-12 DIAGNOSIS — F319 Bipolar disorder, unspecified: Secondary | ICD-10-CM | POA: Diagnosis not present

## 2011-11-12 DIAGNOSIS — J441 Chronic obstructive pulmonary disease with (acute) exacerbation: Secondary | ICD-10-CM | POA: Diagnosis not present

## 2011-11-12 DIAGNOSIS — I2699 Other pulmonary embolism without acute cor pulmonale: Secondary | ICD-10-CM | POA: Diagnosis not present

## 2011-11-12 DIAGNOSIS — E119 Type 2 diabetes mellitus without complications: Secondary | ICD-10-CM | POA: Diagnosis not present

## 2011-11-12 DIAGNOSIS — I10 Essential (primary) hypertension: Secondary | ICD-10-CM | POA: Diagnosis not present

## 2011-11-12 DIAGNOSIS — Z9981 Dependence on supplemental oxygen: Secondary | ICD-10-CM | POA: Diagnosis not present

## 2011-11-18 DIAGNOSIS — F0630 Mood disorder due to known physiological condition, unspecified: Secondary | ICD-10-CM | POA: Diagnosis not present

## 2011-11-19 DIAGNOSIS — J449 Chronic obstructive pulmonary disease, unspecified: Secondary | ICD-10-CM | POA: Diagnosis not present

## 2011-12-04 DIAGNOSIS — L259 Unspecified contact dermatitis, unspecified cause: Secondary | ICD-10-CM | POA: Diagnosis not present

## 2011-12-04 DIAGNOSIS — L723 Sebaceous cyst: Secondary | ICD-10-CM | POA: Diagnosis not present

## 2011-12-14 DIAGNOSIS — Z7901 Long term (current) use of anticoagulants: Secondary | ICD-10-CM | POA: Diagnosis not present

## 2011-12-14 DIAGNOSIS — Z888 Allergy status to other drugs, medicaments and biological substances status: Secondary | ICD-10-CM | POA: Diagnosis not present

## 2011-12-14 DIAGNOSIS — R0789 Other chest pain: Secondary | ICD-10-CM | POA: Diagnosis not present

## 2011-12-14 DIAGNOSIS — I10 Essential (primary) hypertension: Secondary | ICD-10-CM | POA: Diagnosis not present

## 2011-12-14 DIAGNOSIS — R0602 Shortness of breath: Secondary | ICD-10-CM | POA: Diagnosis not present

## 2011-12-14 DIAGNOSIS — Z79899 Other long term (current) drug therapy: Secondary | ICD-10-CM | POA: Diagnosis not present

## 2011-12-14 DIAGNOSIS — E785 Hyperlipidemia, unspecified: Secondary | ICD-10-CM | POA: Diagnosis not present

## 2011-12-14 DIAGNOSIS — F319 Bipolar disorder, unspecified: Secondary | ICD-10-CM | POA: Diagnosis not present

## 2011-12-14 DIAGNOSIS — R002 Palpitations: Secondary | ICD-10-CM | POA: Diagnosis not present

## 2011-12-14 DIAGNOSIS — Z91013 Allergy to seafood: Secondary | ICD-10-CM | POA: Diagnosis not present

## 2011-12-14 DIAGNOSIS — N400 Enlarged prostate without lower urinary tract symptoms: Secondary | ICD-10-CM | POA: Diagnosis not present

## 2011-12-14 DIAGNOSIS — I2782 Chronic pulmonary embolism: Secondary | ICD-10-CM | POA: Diagnosis not present

## 2011-12-14 DIAGNOSIS — Z87891 Personal history of nicotine dependence: Secondary | ICD-10-CM | POA: Diagnosis not present

## 2011-12-14 DIAGNOSIS — R079 Chest pain, unspecified: Secondary | ICD-10-CM | POA: Diagnosis not present

## 2011-12-14 DIAGNOSIS — I509 Heart failure, unspecified: Secondary | ICD-10-CM | POA: Diagnosis not present

## 2011-12-14 DIAGNOSIS — K449 Diaphragmatic hernia without obstruction or gangrene: Secondary | ICD-10-CM | POA: Diagnosis not present

## 2011-12-14 DIAGNOSIS — F411 Generalized anxiety disorder: Secondary | ICD-10-CM | POA: Diagnosis not present

## 2011-12-14 DIAGNOSIS — J449 Chronic obstructive pulmonary disease, unspecified: Secondary | ICD-10-CM | POA: Diagnosis not present

## 2011-12-14 DIAGNOSIS — E119 Type 2 diabetes mellitus without complications: Secondary | ICD-10-CM | POA: Diagnosis not present

## 2011-12-15 DIAGNOSIS — K449 Diaphragmatic hernia without obstruction or gangrene: Secondary | ICD-10-CM | POA: Diagnosis not present

## 2011-12-15 DIAGNOSIS — F411 Generalized anxiety disorder: Secondary | ICD-10-CM | POA: Diagnosis not present

## 2011-12-15 DIAGNOSIS — R079 Chest pain, unspecified: Secondary | ICD-10-CM | POA: Diagnosis not present

## 2011-12-15 DIAGNOSIS — F319 Bipolar disorder, unspecified: Secondary | ICD-10-CM | POA: Diagnosis not present

## 2011-12-15 DIAGNOSIS — J449 Chronic obstructive pulmonary disease, unspecified: Secondary | ICD-10-CM | POA: Diagnosis not present

## 2011-12-15 DIAGNOSIS — R002 Palpitations: Secondary | ICD-10-CM | POA: Diagnosis not present

## 2011-12-16 DIAGNOSIS — R079 Chest pain, unspecified: Secondary | ICD-10-CM

## 2011-12-16 DIAGNOSIS — K449 Diaphragmatic hernia without obstruction or gangrene: Secondary | ICD-10-CM | POA: Diagnosis not present

## 2011-12-16 DIAGNOSIS — F411 Generalized anxiety disorder: Secondary | ICD-10-CM | POA: Diagnosis not present

## 2011-12-16 DIAGNOSIS — F319 Bipolar disorder, unspecified: Secondary | ICD-10-CM | POA: Diagnosis not present

## 2011-12-16 DIAGNOSIS — J449 Chronic obstructive pulmonary disease, unspecified: Secondary | ICD-10-CM | POA: Diagnosis not present

## 2011-12-18 DIAGNOSIS — L259 Unspecified contact dermatitis, unspecified cause: Secondary | ICD-10-CM | POA: Diagnosis not present

## 2011-12-18 DIAGNOSIS — Z1159 Encounter for screening for other viral diseases: Secondary | ICD-10-CM | POA: Diagnosis not present

## 2011-12-18 DIAGNOSIS — L408 Other psoriasis: Secondary | ICD-10-CM | POA: Diagnosis not present

## 2011-12-19 DIAGNOSIS — R079 Chest pain, unspecified: Secondary | ICD-10-CM | POA: Diagnosis not present

## 2011-12-23 DIAGNOSIS — R079 Chest pain, unspecified: Secondary | ICD-10-CM

## 2011-12-24 DIAGNOSIS — I209 Angina pectoris, unspecified: Secondary | ICD-10-CM | POA: Diagnosis not present

## 2012-01-03 DIAGNOSIS — K449 Diaphragmatic hernia without obstruction or gangrene: Secondary | ICD-10-CM | POA: Diagnosis not present

## 2012-01-03 DIAGNOSIS — N400 Enlarged prostate without lower urinary tract symptoms: Secondary | ICD-10-CM | POA: Diagnosis not present

## 2012-01-03 DIAGNOSIS — F341 Dysthymic disorder: Secondary | ICD-10-CM | POA: Diagnosis not present

## 2012-01-03 DIAGNOSIS — B1920 Unspecified viral hepatitis C without hepatic coma: Secondary | ICD-10-CM | POA: Diagnosis not present

## 2012-01-03 DIAGNOSIS — J441 Chronic obstructive pulmonary disease with (acute) exacerbation: Secondary | ICD-10-CM | POA: Diagnosis not present

## 2012-01-03 DIAGNOSIS — Z888 Allergy status to other drugs, medicaments and biological substances status: Secondary | ICD-10-CM | POA: Diagnosis not present

## 2012-01-03 DIAGNOSIS — Z87891 Personal history of nicotine dependence: Secondary | ICD-10-CM | POA: Diagnosis not present

## 2012-01-03 DIAGNOSIS — I2782 Chronic pulmonary embolism: Secondary | ICD-10-CM | POA: Diagnosis not present

## 2012-01-03 DIAGNOSIS — Z7901 Long term (current) use of anticoagulants: Secondary | ICD-10-CM | POA: Diagnosis not present

## 2012-01-03 DIAGNOSIS — E119 Type 2 diabetes mellitus without complications: Secondary | ICD-10-CM | POA: Diagnosis not present

## 2012-01-03 DIAGNOSIS — Z91013 Allergy to seafood: Secondary | ICD-10-CM | POA: Diagnosis not present

## 2012-01-03 DIAGNOSIS — Z23 Encounter for immunization: Secondary | ICD-10-CM | POA: Diagnosis not present

## 2012-01-03 DIAGNOSIS — Z79899 Other long term (current) drug therapy: Secondary | ICD-10-CM | POA: Diagnosis not present

## 2012-01-03 DIAGNOSIS — E785 Hyperlipidemia, unspecified: Secondary | ICD-10-CM | POA: Diagnosis not present

## 2012-01-03 DIAGNOSIS — R0602 Shortness of breath: Secondary | ICD-10-CM

## 2012-01-03 DIAGNOSIS — R918 Other nonspecific abnormal finding of lung field: Secondary | ICD-10-CM | POA: Diagnosis not present

## 2012-01-04 DIAGNOSIS — R0602 Shortness of breath: Secondary | ICD-10-CM | POA: Diagnosis not present

## 2012-01-04 DIAGNOSIS — J441 Chronic obstructive pulmonary disease with (acute) exacerbation: Secondary | ICD-10-CM | POA: Diagnosis not present

## 2012-01-04 DIAGNOSIS — Z23 Encounter for immunization: Secondary | ICD-10-CM | POA: Diagnosis not present

## 2012-01-04 DIAGNOSIS — B1920 Unspecified viral hepatitis C without hepatic coma: Secondary | ICD-10-CM | POA: Diagnosis not present

## 2012-01-05 DIAGNOSIS — B1920 Unspecified viral hepatitis C without hepatic coma: Secondary | ICD-10-CM | POA: Diagnosis not present

## 2012-01-05 DIAGNOSIS — Z23 Encounter for immunization: Secondary | ICD-10-CM | POA: Diagnosis not present

## 2012-01-05 DIAGNOSIS — J441 Chronic obstructive pulmonary disease with (acute) exacerbation: Secondary | ICD-10-CM | POA: Diagnosis not present

## 2012-01-05 DIAGNOSIS — R0602 Shortness of breath: Secondary | ICD-10-CM | POA: Diagnosis not present

## 2012-01-14 DIAGNOSIS — Z23 Encounter for immunization: Secondary | ICD-10-CM | POA: Diagnosis not present

## 2012-01-16 DIAGNOSIS — N400 Enlarged prostate without lower urinary tract symptoms: Secondary | ICD-10-CM | POA: Diagnosis not present

## 2012-01-21 DIAGNOSIS — J449 Chronic obstructive pulmonary disease, unspecified: Secondary | ICD-10-CM | POA: Diagnosis not present

## 2012-01-22 DIAGNOSIS — N400 Enlarged prostate without lower urinary tract symptoms: Secondary | ICD-10-CM | POA: Diagnosis not present

## 2012-02-11 DIAGNOSIS — F0630 Mood disorder due to known physiological condition, unspecified: Secondary | ICD-10-CM | POA: Diagnosis not present

## 2012-02-11 DIAGNOSIS — F4320 Adjustment disorder, unspecified: Secondary | ICD-10-CM | POA: Diagnosis not present

## 2012-02-25 DIAGNOSIS — B182 Chronic viral hepatitis C: Secondary | ICD-10-CM | POA: Diagnosis not present

## 2012-03-05 DIAGNOSIS — K2990 Gastroduodenitis, unspecified, without bleeding: Secondary | ICD-10-CM | POA: Diagnosis not present

## 2012-03-05 DIAGNOSIS — K2970 Gastritis, unspecified, without bleeding: Secondary | ICD-10-CM | POA: Diagnosis not present

## 2012-03-05 DIAGNOSIS — R110 Nausea: Secondary | ICD-10-CM | POA: Diagnosis not present

## 2012-03-15 DIAGNOSIS — Z86711 Personal history of pulmonary embolism: Secondary | ICD-10-CM | POA: Diagnosis not present

## 2012-03-15 DIAGNOSIS — J438 Other emphysema: Secondary | ICD-10-CM | POA: Diagnosis not present

## 2012-03-15 DIAGNOSIS — R0602 Shortness of breath: Secondary | ICD-10-CM | POA: Diagnosis not present

## 2012-03-15 DIAGNOSIS — J441 Chronic obstructive pulmonary disease with (acute) exacerbation: Secondary | ICD-10-CM | POA: Diagnosis not present

## 2012-03-15 DIAGNOSIS — F411 Generalized anxiety disorder: Secondary | ICD-10-CM | POA: Diagnosis not present

## 2012-03-15 DIAGNOSIS — Z7901 Long term (current) use of anticoagulants: Secondary | ICD-10-CM | POA: Diagnosis not present

## 2012-03-15 DIAGNOSIS — I10 Essential (primary) hypertension: Secondary | ICD-10-CM | POA: Diagnosis not present

## 2012-03-15 DIAGNOSIS — Z79899 Other long term (current) drug therapy: Secondary | ICD-10-CM | POA: Diagnosis not present

## 2012-03-15 DIAGNOSIS — E119 Type 2 diabetes mellitus without complications: Secondary | ICD-10-CM | POA: Diagnosis not present

## 2012-04-14 DIAGNOSIS — E782 Mixed hyperlipidemia: Secondary | ICD-10-CM | POA: Diagnosis not present

## 2012-04-14 DIAGNOSIS — J449 Chronic obstructive pulmonary disease, unspecified: Secondary | ICD-10-CM | POA: Diagnosis not present

## 2012-04-19 DIAGNOSIS — E119 Type 2 diabetes mellitus without complications: Secondary | ICD-10-CM | POA: Diagnosis not present

## 2012-04-19 DIAGNOSIS — J441 Chronic obstructive pulmonary disease with (acute) exacerbation: Secondary | ICD-10-CM | POA: Diagnosis not present

## 2012-04-19 DIAGNOSIS — J45901 Unspecified asthma with (acute) exacerbation: Secondary | ICD-10-CM | POA: Diagnosis not present

## 2012-04-19 DIAGNOSIS — N400 Enlarged prostate without lower urinary tract symptoms: Secondary | ICD-10-CM | POA: Diagnosis not present

## 2012-04-19 DIAGNOSIS — B1920 Unspecified viral hepatitis C without hepatic coma: Secondary | ICD-10-CM | POA: Diagnosis not present

## 2012-04-19 DIAGNOSIS — Z79899 Other long term (current) drug therapy: Secondary | ICD-10-CM | POA: Diagnosis not present

## 2012-04-19 DIAGNOSIS — L089 Local infection of the skin and subcutaneous tissue, unspecified: Secondary | ICD-10-CM | POA: Diagnosis not present

## 2012-04-19 DIAGNOSIS — I10 Essential (primary) hypertension: Secondary | ICD-10-CM | POA: Diagnosis not present

## 2012-04-19 DIAGNOSIS — K449 Diaphragmatic hernia without obstruction or gangrene: Secondary | ICD-10-CM | POA: Diagnosis not present

## 2012-04-19 DIAGNOSIS — B958 Unspecified staphylococcus as the cause of diseases classified elsewhere: Secondary | ICD-10-CM | POA: Diagnosis not present

## 2012-04-19 DIAGNOSIS — F319 Bipolar disorder, unspecified: Secondary | ICD-10-CM | POA: Diagnosis not present

## 2012-04-19 DIAGNOSIS — E785 Hyperlipidemia, unspecified: Secondary | ICD-10-CM | POA: Diagnosis not present

## 2012-04-19 DIAGNOSIS — J449 Chronic obstructive pulmonary disease, unspecified: Secondary | ICD-10-CM | POA: Diagnosis not present

## 2012-04-19 DIAGNOSIS — Z87891 Personal history of nicotine dependence: Secondary | ICD-10-CM | POA: Diagnosis not present

## 2012-04-19 DIAGNOSIS — R0602 Shortness of breath: Secondary | ICD-10-CM | POA: Diagnosis not present

## 2012-04-19 DIAGNOSIS — F411 Generalized anxiety disorder: Secondary | ICD-10-CM | POA: Diagnosis not present

## 2012-04-19 DIAGNOSIS — Z86711 Personal history of pulmonary embolism: Secondary | ICD-10-CM | POA: Diagnosis not present

## 2012-04-19 DIAGNOSIS — Z91013 Allergy to seafood: Secondary | ICD-10-CM | POA: Diagnosis not present

## 2012-04-19 DIAGNOSIS — Z888 Allergy status to other drugs, medicaments and biological substances status: Secondary | ICD-10-CM | POA: Diagnosis not present

## 2012-04-20 DIAGNOSIS — F411 Generalized anxiety disorder: Secondary | ICD-10-CM | POA: Diagnosis not present

## 2012-04-20 DIAGNOSIS — J441 Chronic obstructive pulmonary disease with (acute) exacerbation: Secondary | ICD-10-CM | POA: Diagnosis not present

## 2012-04-20 DIAGNOSIS — Z86711 Personal history of pulmonary embolism: Secondary | ICD-10-CM | POA: Diagnosis not present

## 2012-04-20 DIAGNOSIS — E119 Type 2 diabetes mellitus without complications: Secondary | ICD-10-CM | POA: Diagnosis not present

## 2012-04-20 DIAGNOSIS — R0602 Shortness of breath: Secondary | ICD-10-CM | POA: Diagnosis not present

## 2012-04-21 DIAGNOSIS — R0602 Shortness of breath: Secondary | ICD-10-CM | POA: Diagnosis not present

## 2012-04-21 DIAGNOSIS — Z86711 Personal history of pulmonary embolism: Secondary | ICD-10-CM | POA: Diagnosis not present

## 2012-04-21 DIAGNOSIS — E119 Type 2 diabetes mellitus without complications: Secondary | ICD-10-CM | POA: Diagnosis not present

## 2012-04-21 DIAGNOSIS — F411 Generalized anxiety disorder: Secondary | ICD-10-CM | POA: Diagnosis not present

## 2012-04-21 DIAGNOSIS — J441 Chronic obstructive pulmonary disease with (acute) exacerbation: Secondary | ICD-10-CM | POA: Diagnosis not present

## 2012-04-22 DIAGNOSIS — F411 Generalized anxiety disorder: Secondary | ICD-10-CM | POA: Diagnosis not present

## 2012-04-22 DIAGNOSIS — Z86711 Personal history of pulmonary embolism: Secondary | ICD-10-CM | POA: Diagnosis not present

## 2012-04-22 DIAGNOSIS — J441 Chronic obstructive pulmonary disease with (acute) exacerbation: Secondary | ICD-10-CM | POA: Diagnosis not present

## 2012-04-22 DIAGNOSIS — E119 Type 2 diabetes mellitus without complications: Secondary | ICD-10-CM | POA: Diagnosis not present

## 2012-04-22 DIAGNOSIS — R0602 Shortness of breath: Secondary | ICD-10-CM | POA: Diagnosis not present

## 2012-04-25 DIAGNOSIS — J449 Chronic obstructive pulmonary disease, unspecified: Secondary | ICD-10-CM | POA: Diagnosis not present

## 2012-04-25 DIAGNOSIS — E119 Type 2 diabetes mellitus without complications: Secondary | ICD-10-CM | POA: Diagnosis not present

## 2012-04-25 DIAGNOSIS — Z87891 Personal history of nicotine dependence: Secondary | ICD-10-CM | POA: Diagnosis not present

## 2012-04-25 DIAGNOSIS — R05 Cough: Secondary | ICD-10-CM | POA: Diagnosis not present

## 2012-04-25 DIAGNOSIS — Z79899 Other long term (current) drug therapy: Secondary | ICD-10-CM | POA: Diagnosis not present

## 2012-04-25 DIAGNOSIS — I10 Essential (primary) hypertension: Secondary | ICD-10-CM | POA: Diagnosis not present

## 2012-04-25 DIAGNOSIS — J189 Pneumonia, unspecified organism: Secondary | ICD-10-CM | POA: Diagnosis not present

## 2012-04-25 DIAGNOSIS — Z86711 Personal history of pulmonary embolism: Secondary | ICD-10-CM | POA: Diagnosis not present

## 2012-04-25 DIAGNOSIS — R0602 Shortness of breath: Secondary | ICD-10-CM | POA: Diagnosis not present

## 2012-05-12 DIAGNOSIS — J449 Chronic obstructive pulmonary disease, unspecified: Secondary | ICD-10-CM | POA: Diagnosis not present

## 2012-06-01 DIAGNOSIS — F0630 Mood disorder due to known physiological condition, unspecified: Secondary | ICD-10-CM | POA: Diagnosis not present

## 2012-06-02 DIAGNOSIS — R39198 Other difficulties with micturition: Secondary | ICD-10-CM | POA: Diagnosis not present

## 2012-06-02 DIAGNOSIS — R3911 Hesitancy of micturition: Secondary | ICD-10-CM | POA: Diagnosis not present

## 2012-06-02 DIAGNOSIS — R3915 Urgency of urination: Secondary | ICD-10-CM | POA: Diagnosis not present

## 2012-06-02 DIAGNOSIS — R351 Nocturia: Secondary | ICD-10-CM | POA: Diagnosis not present

## 2012-06-04 DIAGNOSIS — R3919 Other difficulties with micturition: Secondary | ICD-10-CM | POA: Diagnosis not present

## 2012-06-04 DIAGNOSIS — E7800 Pure hypercholesterolemia, unspecified: Secondary | ICD-10-CM | POA: Diagnosis not present

## 2012-06-04 DIAGNOSIS — R3915 Urgency of urination: Secondary | ICD-10-CM | POA: Diagnosis not present

## 2012-06-04 DIAGNOSIS — I10 Essential (primary) hypertension: Secondary | ICD-10-CM | POA: Diagnosis not present

## 2012-06-04 DIAGNOSIS — IMO0002 Reserved for concepts with insufficient information to code with codable children: Secondary | ICD-10-CM | POA: Diagnosis not present

## 2012-06-04 DIAGNOSIS — R3911 Hesitancy of micturition: Secondary | ICD-10-CM | POA: Diagnosis not present

## 2012-06-04 DIAGNOSIS — Z9079 Acquired absence of other genital organ(s): Secondary | ICD-10-CM | POA: Diagnosis not present

## 2012-06-04 DIAGNOSIS — R32 Unspecified urinary incontinence: Secondary | ICD-10-CM | POA: Diagnosis not present

## 2012-06-04 DIAGNOSIS — R351 Nocturia: Secondary | ICD-10-CM | POA: Diagnosis not present

## 2012-06-04 DIAGNOSIS — Z79899 Other long term (current) drug therapy: Secondary | ICD-10-CM | POA: Diagnosis not present

## 2012-06-04 DIAGNOSIS — K219 Gastro-esophageal reflux disease without esophagitis: Secondary | ICD-10-CM | POA: Diagnosis not present

## 2012-06-04 DIAGNOSIS — N320 Bladder-neck obstruction: Secondary | ICD-10-CM | POA: Diagnosis not present

## 2012-06-10 DIAGNOSIS — Z87891 Personal history of nicotine dependence: Secondary | ICD-10-CM | POA: Diagnosis not present

## 2012-06-10 DIAGNOSIS — J449 Chronic obstructive pulmonary disease, unspecified: Secondary | ICD-10-CM | POA: Diagnosis not present

## 2012-06-10 DIAGNOSIS — M5030 Other cervical disc degeneration, unspecified cervical region: Secondary | ICD-10-CM | POA: Diagnosis not present

## 2012-06-10 DIAGNOSIS — K2970 Gastritis, unspecified, without bleeding: Secondary | ICD-10-CM | POA: Diagnosis not present

## 2012-06-10 DIAGNOSIS — I509 Heart failure, unspecified: Secondary | ICD-10-CM | POA: Diagnosis not present

## 2012-06-10 DIAGNOSIS — M47812 Spondylosis without myelopathy or radiculopathy, cervical region: Secondary | ICD-10-CM | POA: Diagnosis not present

## 2012-06-10 DIAGNOSIS — R10819 Abdominal tenderness, unspecified site: Secondary | ICD-10-CM | POA: Diagnosis not present

## 2012-06-10 DIAGNOSIS — K5900 Constipation, unspecified: Secondary | ICD-10-CM | POA: Diagnosis not present

## 2012-06-10 DIAGNOSIS — Z79899 Other long term (current) drug therapy: Secondary | ICD-10-CM | POA: Diagnosis not present

## 2012-06-10 DIAGNOSIS — M5090 Cervical disc disorder, unspecified, unspecified cervical region: Secondary | ICD-10-CM | POA: Diagnosis not present

## 2012-06-10 DIAGNOSIS — E119 Type 2 diabetes mellitus without complications: Secondary | ICD-10-CM | POA: Diagnosis not present

## 2012-06-10 DIAGNOSIS — I10 Essential (primary) hypertension: Secondary | ICD-10-CM | POA: Diagnosis not present

## 2012-06-15 DIAGNOSIS — R3915 Urgency of urination: Secondary | ICD-10-CM | POA: Diagnosis not present

## 2012-06-15 DIAGNOSIS — B029 Zoster without complications: Secondary | ICD-10-CM | POA: Diagnosis not present

## 2012-06-15 DIAGNOSIS — N320 Bladder-neck obstruction: Secondary | ICD-10-CM | POA: Diagnosis not present

## 2012-06-15 DIAGNOSIS — R3911 Hesitancy of micturition: Secondary | ICD-10-CM | POA: Diagnosis not present

## 2012-06-15 DIAGNOSIS — R351 Nocturia: Secondary | ICD-10-CM | POA: Diagnosis not present

## 2012-07-12 DIAGNOSIS — I252 Old myocardial infarction: Secondary | ICD-10-CM | POA: Diagnosis not present

## 2012-07-12 DIAGNOSIS — J209 Acute bronchitis, unspecified: Secondary | ICD-10-CM | POA: Diagnosis not present

## 2012-07-12 DIAGNOSIS — I10 Essential (primary) hypertension: Secondary | ICD-10-CM | POA: Diagnosis not present

## 2012-07-12 DIAGNOSIS — E119 Type 2 diabetes mellitus without complications: Secondary | ICD-10-CM | POA: Diagnosis not present

## 2012-07-12 DIAGNOSIS — Z87891 Personal history of nicotine dependence: Secondary | ICD-10-CM | POA: Diagnosis not present

## 2012-07-12 DIAGNOSIS — G8929 Other chronic pain: Secondary | ICD-10-CM | POA: Diagnosis not present

## 2012-07-12 DIAGNOSIS — Z86711 Personal history of pulmonary embolism: Secondary | ICD-10-CM | POA: Diagnosis not present

## 2012-07-12 DIAGNOSIS — M549 Dorsalgia, unspecified: Secondary | ICD-10-CM | POA: Diagnosis not present

## 2012-07-12 DIAGNOSIS — Z79899 Other long term (current) drug therapy: Secondary | ICD-10-CM | POA: Diagnosis not present

## 2012-07-12 DIAGNOSIS — I509 Heart failure, unspecified: Secondary | ICD-10-CM | POA: Diagnosis not present

## 2012-07-12 DIAGNOSIS — B029 Zoster without complications: Secondary | ICD-10-CM | POA: Diagnosis not present

## 2012-07-12 DIAGNOSIS — J449 Chronic obstructive pulmonary disease, unspecified: Secondary | ICD-10-CM | POA: Diagnosis not present

## 2012-07-12 DIAGNOSIS — R0602 Shortness of breath: Secondary | ICD-10-CM | POA: Diagnosis not present

## 2012-07-15 DIAGNOSIS — Z8249 Family history of ischemic heart disease and other diseases of the circulatory system: Secondary | ICD-10-CM | POA: Diagnosis not present

## 2012-07-15 DIAGNOSIS — E871 Hypo-osmolality and hyponatremia: Secondary | ICD-10-CM | POA: Diagnosis not present

## 2012-07-15 DIAGNOSIS — I498 Other specified cardiac arrhythmias: Secondary | ICD-10-CM | POA: Diagnosis not present

## 2012-07-15 DIAGNOSIS — R0602 Shortness of breath: Secondary | ICD-10-CM | POA: Diagnosis not present

## 2012-07-15 DIAGNOSIS — K449 Diaphragmatic hernia without obstruction or gangrene: Secondary | ICD-10-CM | POA: Diagnosis not present

## 2012-07-15 DIAGNOSIS — M129 Arthropathy, unspecified: Secondary | ICD-10-CM | POA: Diagnosis not present

## 2012-07-15 DIAGNOSIS — J189 Pneumonia, unspecified organism: Secondary | ICD-10-CM | POA: Diagnosis not present

## 2012-07-15 DIAGNOSIS — Z91013 Allergy to seafood: Secondary | ICD-10-CM | POA: Diagnosis not present

## 2012-07-15 DIAGNOSIS — R0789 Other chest pain: Secondary | ICD-10-CM | POA: Diagnosis not present

## 2012-07-15 DIAGNOSIS — Z85810 Personal history of malignant neoplasm of tongue: Secondary | ICD-10-CM | POA: Diagnosis not present

## 2012-07-15 DIAGNOSIS — B182 Chronic viral hepatitis C: Secondary | ICD-10-CM | POA: Diagnosis not present

## 2012-07-15 DIAGNOSIS — L089 Local infection of the skin and subcutaneous tissue, unspecified: Secondary | ICD-10-CM | POA: Diagnosis not present

## 2012-07-15 DIAGNOSIS — F3130 Bipolar disorder, current episode depressed, mild or moderate severity, unspecified: Secondary | ICD-10-CM | POA: Diagnosis not present

## 2012-07-15 DIAGNOSIS — R079 Chest pain, unspecified: Secondary | ICD-10-CM | POA: Diagnosis not present

## 2012-07-15 DIAGNOSIS — I2782 Chronic pulmonary embolism: Secondary | ICD-10-CM | POA: Diagnosis not present

## 2012-07-15 DIAGNOSIS — R6889 Other general symptoms and signs: Secondary | ICD-10-CM | POA: Diagnosis not present

## 2012-07-15 DIAGNOSIS — E785 Hyperlipidemia, unspecified: Secondary | ICD-10-CM | POA: Diagnosis not present

## 2012-07-15 DIAGNOSIS — J45901 Unspecified asthma with (acute) exacerbation: Secondary | ICD-10-CM | POA: Diagnosis not present

## 2012-07-15 DIAGNOSIS — M79609 Pain in unspecified limb: Secondary | ICD-10-CM | POA: Diagnosis not present

## 2012-07-15 DIAGNOSIS — B958 Unspecified staphylococcus as the cause of diseases classified elsewhere: Secondary | ICD-10-CM | POA: Diagnosis not present

## 2012-07-15 DIAGNOSIS — Z888 Allergy status to other drugs, medicaments and biological substances status: Secondary | ICD-10-CM | POA: Diagnosis not present

## 2012-07-15 DIAGNOSIS — E119 Type 2 diabetes mellitus without complications: Secondary | ICD-10-CM | POA: Diagnosis not present

## 2012-07-15 DIAGNOSIS — I509 Heart failure, unspecified: Secondary | ICD-10-CM | POA: Diagnosis not present

## 2012-07-15 DIAGNOSIS — I10 Essential (primary) hypertension: Secondary | ICD-10-CM | POA: Diagnosis not present

## 2012-07-15 DIAGNOSIS — F411 Generalized anxiety disorder: Secondary | ICD-10-CM | POA: Diagnosis not present

## 2012-07-15 DIAGNOSIS — N400 Enlarged prostate without lower urinary tract symptoms: Secondary | ICD-10-CM | POA: Diagnosis not present

## 2012-07-15 DIAGNOSIS — Z7901 Long term (current) use of anticoagulants: Secondary | ICD-10-CM | POA: Diagnosis not present

## 2012-07-15 DIAGNOSIS — Z79899 Other long term (current) drug therapy: Secondary | ICD-10-CM | POA: Diagnosis not present

## 2012-07-16 DIAGNOSIS — R0602 Shortness of breath: Secondary | ICD-10-CM | POA: Diagnosis not present

## 2012-07-16 DIAGNOSIS — J45901 Unspecified asthma with (acute) exacerbation: Secondary | ICD-10-CM | POA: Diagnosis not present

## 2012-07-16 DIAGNOSIS — R0789 Other chest pain: Secondary | ICD-10-CM | POA: Diagnosis not present

## 2012-07-16 DIAGNOSIS — M79609 Pain in unspecified limb: Secondary | ICD-10-CM | POA: Diagnosis not present

## 2012-07-16 DIAGNOSIS — F411 Generalized anxiety disorder: Secondary | ICD-10-CM | POA: Diagnosis not present

## 2012-07-17 DIAGNOSIS — R0602 Shortness of breath: Secondary | ICD-10-CM | POA: Diagnosis not present

## 2012-07-17 DIAGNOSIS — J441 Chronic obstructive pulmonary disease with (acute) exacerbation: Secondary | ICD-10-CM | POA: Diagnosis not present

## 2012-07-17 DIAGNOSIS — F411 Generalized anxiety disorder: Secondary | ICD-10-CM | POA: Diagnosis not present

## 2012-07-17 DIAGNOSIS — R0789 Other chest pain: Secondary | ICD-10-CM | POA: Diagnosis not present

## 2012-07-17 DIAGNOSIS — M79609 Pain in unspecified limb: Secondary | ICD-10-CM | POA: Diagnosis not present

## 2012-07-18 DIAGNOSIS — I252 Old myocardial infarction: Secondary | ICD-10-CM | POA: Diagnosis not present

## 2012-07-18 DIAGNOSIS — Z87891 Personal history of nicotine dependence: Secondary | ICD-10-CM | POA: Diagnosis not present

## 2012-07-18 DIAGNOSIS — R109 Unspecified abdominal pain: Secondary | ICD-10-CM | POA: Diagnosis not present

## 2012-07-18 DIAGNOSIS — R05 Cough: Secondary | ICD-10-CM | POA: Diagnosis not present

## 2012-07-18 DIAGNOSIS — Z79899 Other long term (current) drug therapy: Secondary | ICD-10-CM | POA: Diagnosis not present

## 2012-07-18 DIAGNOSIS — B1920 Unspecified viral hepatitis C without hepatic coma: Secondary | ICD-10-CM | POA: Diagnosis not present

## 2012-07-18 DIAGNOSIS — I10 Essential (primary) hypertension: Secondary | ICD-10-CM | POA: Diagnosis not present

## 2012-07-18 DIAGNOSIS — R279 Unspecified lack of coordination: Secondary | ICD-10-CM | POA: Diagnosis not present

## 2012-07-18 DIAGNOSIS — J449 Chronic obstructive pulmonary disease, unspecified: Secondary | ICD-10-CM | POA: Diagnosis not present

## 2012-07-18 DIAGNOSIS — F411 Generalized anxiety disorder: Secondary | ICD-10-CM | POA: Diagnosis not present

## 2012-07-18 DIAGNOSIS — E119 Type 2 diabetes mellitus without complications: Secondary | ICD-10-CM | POA: Diagnosis not present

## 2012-07-18 DIAGNOSIS — I509 Heart failure, unspecified: Secondary | ICD-10-CM | POA: Diagnosis not present

## 2012-08-13 DIAGNOSIS — M47812 Spondylosis without myelopathy or radiculopathy, cervical region: Secondary | ICD-10-CM | POA: Diagnosis not present

## 2012-08-17 DIAGNOSIS — H60399 Other infective otitis externa, unspecified ear: Secondary | ICD-10-CM | POA: Diagnosis not present

## 2012-08-17 DIAGNOSIS — J0100 Acute maxillary sinusitis, unspecified: Secondary | ICD-10-CM | POA: Diagnosis not present

## 2012-09-28 DIAGNOSIS — F0630 Mood disorder due to known physiological condition, unspecified: Secondary | ICD-10-CM | POA: Diagnosis not present

## 2012-09-29 DIAGNOSIS — M5030 Other cervical disc degeneration, unspecified cervical region: Secondary | ICD-10-CM | POA: Diagnosis not present

## 2012-09-29 DIAGNOSIS — M47812 Spondylosis without myelopathy or radiculopathy, cervical region: Secondary | ICD-10-CM | POA: Diagnosis not present

## 2012-10-01 DIAGNOSIS — I509 Heart failure, unspecified: Secondary | ICD-10-CM | POA: Diagnosis not present

## 2012-10-01 DIAGNOSIS — Z87891 Personal history of nicotine dependence: Secondary | ICD-10-CM | POA: Diagnosis not present

## 2012-10-01 DIAGNOSIS — F411 Generalized anxiety disorder: Secondary | ICD-10-CM | POA: Diagnosis not present

## 2012-10-01 DIAGNOSIS — F329 Major depressive disorder, single episode, unspecified: Secondary | ICD-10-CM | POA: Diagnosis not present

## 2012-10-01 DIAGNOSIS — Z79899 Other long term (current) drug therapy: Secondary | ICD-10-CM | POA: Diagnosis not present

## 2012-10-01 DIAGNOSIS — M5030 Other cervical disc degeneration, unspecified cervical region: Secondary | ICD-10-CM | POA: Diagnosis not present

## 2012-10-01 DIAGNOSIS — I10 Essential (primary) hypertension: Secondary | ICD-10-CM | POA: Diagnosis not present

## 2012-10-01 DIAGNOSIS — E119 Type 2 diabetes mellitus without complications: Secondary | ICD-10-CM | POA: Diagnosis not present

## 2012-10-01 DIAGNOSIS — Z85810 Personal history of malignant neoplasm of tongue: Secondary | ICD-10-CM | POA: Diagnosis not present

## 2012-10-01 DIAGNOSIS — Z888 Allergy status to other drugs, medicaments and biological substances status: Secondary | ICD-10-CM | POA: Diagnosis not present

## 2012-10-01 DIAGNOSIS — Z91013 Allergy to seafood: Secondary | ICD-10-CM | POA: Diagnosis not present

## 2012-10-01 DIAGNOSIS — M47812 Spondylosis without myelopathy or radiculopathy, cervical region: Secondary | ICD-10-CM | POA: Diagnosis not present

## 2012-10-01 DIAGNOSIS — J449 Chronic obstructive pulmonary disease, unspecified: Secondary | ICD-10-CM | POA: Diagnosis not present

## 2012-10-01 DIAGNOSIS — E785 Hyperlipidemia, unspecified: Secondary | ICD-10-CM | POA: Diagnosis not present

## 2012-10-16 DIAGNOSIS — H60399 Other infective otitis externa, unspecified ear: Secondary | ICD-10-CM | POA: Diagnosis not present

## 2012-10-22 DIAGNOSIS — M47812 Spondylosis without myelopathy or radiculopathy, cervical region: Secondary | ICD-10-CM | POA: Diagnosis not present

## 2012-10-22 DIAGNOSIS — M5030 Other cervical disc degeneration, unspecified cervical region: Secondary | ICD-10-CM | POA: Diagnosis not present

## 2012-10-30 DIAGNOSIS — Z888 Allergy status to other drugs, medicaments and biological substances status: Secondary | ICD-10-CM | POA: Diagnosis not present

## 2012-10-30 DIAGNOSIS — E119 Type 2 diabetes mellitus without complications: Secondary | ICD-10-CM | POA: Diagnosis not present

## 2012-10-30 DIAGNOSIS — I509 Heart failure, unspecified: Secondary | ICD-10-CM | POA: Diagnosis not present

## 2012-10-30 DIAGNOSIS — Z79899 Other long term (current) drug therapy: Secondary | ICD-10-CM | POA: Diagnosis not present

## 2012-10-30 DIAGNOSIS — E785 Hyperlipidemia, unspecified: Secondary | ICD-10-CM | POA: Diagnosis not present

## 2012-10-30 DIAGNOSIS — M47812 Spondylosis without myelopathy or radiculopathy, cervical region: Secondary | ICD-10-CM | POA: Diagnosis not present

## 2012-10-30 DIAGNOSIS — J449 Chronic obstructive pulmonary disease, unspecified: Secondary | ICD-10-CM | POA: Diagnosis not present

## 2012-10-30 DIAGNOSIS — F329 Major depressive disorder, single episode, unspecified: Secondary | ICD-10-CM | POA: Diagnosis not present

## 2012-10-30 DIAGNOSIS — F411 Generalized anxiety disorder: Secondary | ICD-10-CM | POA: Diagnosis not present

## 2012-10-30 DIAGNOSIS — Z91013 Allergy to seafood: Secondary | ICD-10-CM | POA: Diagnosis not present

## 2012-10-30 DIAGNOSIS — J309 Allergic rhinitis, unspecified: Secondary | ICD-10-CM | POA: Diagnosis not present

## 2012-10-30 DIAGNOSIS — Z85810 Personal history of malignant neoplasm of tongue: Secondary | ICD-10-CM | POA: Diagnosis not present

## 2012-10-30 DIAGNOSIS — I10 Essential (primary) hypertension: Secondary | ICD-10-CM | POA: Diagnosis not present

## 2012-10-30 DIAGNOSIS — M5030 Other cervical disc degeneration, unspecified cervical region: Secondary | ICD-10-CM | POA: Diagnosis not present

## 2012-10-30 DIAGNOSIS — Z87891 Personal history of nicotine dependence: Secondary | ICD-10-CM | POA: Diagnosis not present

## 2012-11-26 DIAGNOSIS — IMO0001 Reserved for inherently not codable concepts without codable children: Secondary | ICD-10-CM | POA: Diagnosis not present

## 2012-11-26 DIAGNOSIS — M5030 Other cervical disc degeneration, unspecified cervical region: Secondary | ICD-10-CM | POA: Diagnosis not present

## 2012-11-26 DIAGNOSIS — M47812 Spondylosis without myelopathy or radiculopathy, cervical region: Secondary | ICD-10-CM | POA: Diagnosis not present

## 2012-12-17 DIAGNOSIS — H60399 Other infective otitis externa, unspecified ear: Secondary | ICD-10-CM | POA: Diagnosis not present

## 2012-12-17 DIAGNOSIS — E782 Mixed hyperlipidemia: Secondary | ICD-10-CM | POA: Diagnosis not present

## 2013-01-18 DIAGNOSIS — F064 Anxiety disorder due to known physiological condition: Secondary | ICD-10-CM | POA: Diagnosis not present

## 2013-01-19 DIAGNOSIS — Z85810 Personal history of malignant neoplasm of tongue: Secondary | ICD-10-CM | POA: Diagnosis not present

## 2013-01-19 DIAGNOSIS — Z87891 Personal history of nicotine dependence: Secondary | ICD-10-CM | POA: Diagnosis not present

## 2013-01-19 DIAGNOSIS — Z888 Allergy status to other drugs, medicaments and biological substances status: Secondary | ICD-10-CM | POA: Diagnosis not present

## 2013-01-19 DIAGNOSIS — Z9981 Dependence on supplemental oxygen: Secondary | ICD-10-CM | POA: Diagnosis not present

## 2013-01-19 DIAGNOSIS — R197 Diarrhea, unspecified: Secondary | ICD-10-CM | POA: Diagnosis not present

## 2013-01-19 DIAGNOSIS — R42 Dizziness and giddiness: Secondary | ICD-10-CM | POA: Diagnosis not present

## 2013-01-19 DIAGNOSIS — R110 Nausea: Secondary | ICD-10-CM | POA: Diagnosis not present

## 2013-01-19 DIAGNOSIS — Z79899 Other long term (current) drug therapy: Secondary | ICD-10-CM | POA: Diagnosis not present

## 2013-01-19 DIAGNOSIS — F411 Generalized anxiety disorder: Secondary | ICD-10-CM | POA: Diagnosis not present

## 2013-01-19 DIAGNOSIS — I252 Old myocardial infarction: Secondary | ICD-10-CM | POA: Diagnosis not present

## 2013-01-19 DIAGNOSIS — IMO0002 Reserved for concepts with insufficient information to code with codable children: Secondary | ICD-10-CM | POA: Diagnosis not present

## 2013-01-19 DIAGNOSIS — R6889 Other general symptoms and signs: Secondary | ICD-10-CM | POA: Diagnosis not present

## 2013-01-19 DIAGNOSIS — Z86711 Personal history of pulmonary embolism: Secondary | ICD-10-CM | POA: Diagnosis not present

## 2013-01-19 DIAGNOSIS — J329 Chronic sinusitis, unspecified: Secondary | ICD-10-CM | POA: Diagnosis not present

## 2013-01-19 DIAGNOSIS — J984 Other disorders of lung: Secondary | ICD-10-CM | POA: Diagnosis not present

## 2013-01-19 DIAGNOSIS — J441 Chronic obstructive pulmonary disease with (acute) exacerbation: Secondary | ICD-10-CM | POA: Diagnosis not present

## 2013-01-19 DIAGNOSIS — E119 Type 2 diabetes mellitus without complications: Secondary | ICD-10-CM | POA: Diagnosis not present

## 2013-01-19 DIAGNOSIS — I509 Heart failure, unspecified: Secondary | ICD-10-CM | POA: Diagnosis not present

## 2013-01-19 DIAGNOSIS — R0602 Shortness of breath: Secondary | ICD-10-CM | POA: Diagnosis not present

## 2013-01-20 DIAGNOSIS — R0602 Shortness of breath: Secondary | ICD-10-CM | POA: Diagnosis not present

## 2013-01-23 DIAGNOSIS — R079 Chest pain, unspecified: Secondary | ICD-10-CM | POA: Diagnosis not present

## 2013-01-23 DIAGNOSIS — B1920 Unspecified viral hepatitis C without hepatic coma: Secondary | ICD-10-CM | POA: Diagnosis not present

## 2013-01-23 DIAGNOSIS — IMO0002 Reserved for concepts with insufficient information to code with codable children: Secondary | ICD-10-CM | POA: Diagnosis not present

## 2013-01-23 DIAGNOSIS — Z85810 Personal history of malignant neoplasm of tongue: Secondary | ICD-10-CM | POA: Diagnosis not present

## 2013-01-23 DIAGNOSIS — R1013 Epigastric pain: Secondary | ICD-10-CM | POA: Diagnosis not present

## 2013-01-23 DIAGNOSIS — E119 Type 2 diabetes mellitus without complications: Secondary | ICD-10-CM | POA: Diagnosis not present

## 2013-01-23 DIAGNOSIS — Z79899 Other long term (current) drug therapy: Secondary | ICD-10-CM | POA: Diagnosis not present

## 2013-01-23 DIAGNOSIS — I4891 Unspecified atrial fibrillation: Secondary | ICD-10-CM | POA: Diagnosis not present

## 2013-01-23 DIAGNOSIS — R109 Unspecified abdominal pain: Secondary | ICD-10-CM | POA: Diagnosis not present

## 2013-01-23 DIAGNOSIS — K5730 Diverticulosis of large intestine without perforation or abscess without bleeding: Secondary | ICD-10-CM | POA: Diagnosis not present

## 2013-01-23 DIAGNOSIS — I10 Essential (primary) hypertension: Secondary | ICD-10-CM | POA: Diagnosis not present

## 2013-01-23 DIAGNOSIS — Z86711 Personal history of pulmonary embolism: Secondary | ICD-10-CM | POA: Diagnosis not present

## 2013-01-23 DIAGNOSIS — J449 Chronic obstructive pulmonary disease, unspecified: Secondary | ICD-10-CM | POA: Diagnosis not present

## 2013-01-23 DIAGNOSIS — I252 Old myocardial infarction: Secondary | ICD-10-CM | POA: Diagnosis not present

## 2013-01-23 DIAGNOSIS — I509 Heart failure, unspecified: Secondary | ICD-10-CM | POA: Diagnosis not present

## 2013-01-23 DIAGNOSIS — Z888 Allergy status to other drugs, medicaments and biological substances status: Secondary | ICD-10-CM | POA: Diagnosis not present

## 2013-01-25 DIAGNOSIS — R079 Chest pain, unspecified: Secondary | ICD-10-CM | POA: Diagnosis not present

## 2013-01-29 DIAGNOSIS — J449 Chronic obstructive pulmonary disease, unspecified: Secondary | ICD-10-CM | POA: Diagnosis not present

## 2013-01-29 DIAGNOSIS — H60399 Other infective otitis externa, unspecified ear: Secondary | ICD-10-CM | POA: Diagnosis not present

## 2013-03-24 DIAGNOSIS — R351 Nocturia: Secondary | ICD-10-CM | POA: Diagnosis not present

## 2013-03-24 DIAGNOSIS — R3915 Urgency of urination: Secondary | ICD-10-CM | POA: Diagnosis not present

## 2013-03-24 DIAGNOSIS — Z23 Encounter for immunization: Secondary | ICD-10-CM | POA: Diagnosis not present

## 2013-04-12 DIAGNOSIS — F064 Anxiety disorder due to known physiological condition: Secondary | ICD-10-CM | POA: Diagnosis not present

## 2013-04-22 DIAGNOSIS — Z79899 Other long term (current) drug therapy: Secondary | ICD-10-CM | POA: Diagnosis not present

## 2013-04-22 DIAGNOSIS — R3915 Urgency of urination: Secondary | ICD-10-CM | POA: Diagnosis not present

## 2013-04-22 DIAGNOSIS — N320 Bladder-neck obstruction: Secondary | ICD-10-CM | POA: Diagnosis not present

## 2013-04-22 DIAGNOSIS — J449 Chronic obstructive pulmonary disease, unspecified: Secondary | ICD-10-CM | POA: Diagnosis not present

## 2013-04-22 DIAGNOSIS — R3911 Hesitancy of micturition: Secondary | ICD-10-CM | POA: Diagnosis not present

## 2013-04-22 DIAGNOSIS — R351 Nocturia: Secondary | ICD-10-CM | POA: Diagnosis not present

## 2013-04-22 DIAGNOSIS — IMO0002 Reserved for concepts with insufficient information to code with codable children: Secondary | ICD-10-CM | POA: Diagnosis not present

## 2013-04-22 DIAGNOSIS — E7800 Pure hypercholesterolemia, unspecified: Secondary | ICD-10-CM | POA: Diagnosis not present

## 2013-04-22 DIAGNOSIS — Z86711 Personal history of pulmonary embolism: Secondary | ICD-10-CM | POA: Diagnosis not present

## 2013-04-22 DIAGNOSIS — K219 Gastro-esophageal reflux disease without esophagitis: Secondary | ICD-10-CM | POA: Diagnosis not present

## 2013-04-22 DIAGNOSIS — N368 Other specified disorders of urethra: Secondary | ICD-10-CM | POA: Diagnosis not present

## 2013-04-22 DIAGNOSIS — E119 Type 2 diabetes mellitus without complications: Secondary | ICD-10-CM | POA: Diagnosis not present

## 2013-04-22 DIAGNOSIS — I10 Essential (primary) hypertension: Secondary | ICD-10-CM | POA: Diagnosis not present

## 2013-04-22 DIAGNOSIS — F411 Generalized anxiety disorder: Secondary | ICD-10-CM | POA: Diagnosis not present

## 2013-04-22 DIAGNOSIS — R39198 Other difficulties with micturition: Secondary | ICD-10-CM | POA: Diagnosis not present

## 2013-05-11 DIAGNOSIS — K730 Chronic persistent hepatitis, not elsewhere classified: Secondary | ICD-10-CM | POA: Diagnosis not present

## 2013-05-12 DIAGNOSIS — E782 Mixed hyperlipidemia: Secondary | ICD-10-CM | POA: Diagnosis not present

## 2013-05-12 DIAGNOSIS — IMO0001 Reserved for inherently not codable concepts without codable children: Secondary | ICD-10-CM | POA: Diagnosis not present

## 2013-05-17 DIAGNOSIS — K759 Inflammatory liver disease, unspecified: Secondary | ICD-10-CM | POA: Diagnosis not present

## 2013-05-17 DIAGNOSIS — B1920 Unspecified viral hepatitis C without hepatic coma: Secondary | ICD-10-CM | POA: Diagnosis not present

## 2013-05-18 DIAGNOSIS — R3915 Urgency of urination: Secondary | ICD-10-CM | POA: Diagnosis not present

## 2013-05-18 DIAGNOSIS — R39198 Other difficulties with micturition: Secondary | ICD-10-CM | POA: Diagnosis not present

## 2013-05-18 DIAGNOSIS — N320 Bladder-neck obstruction: Secondary | ICD-10-CM | POA: Diagnosis not present

## 2013-05-18 DIAGNOSIS — R3911 Hesitancy of micturition: Secondary | ICD-10-CM | POA: Diagnosis not present

## 2013-05-20 DIAGNOSIS — K5900 Constipation, unspecified: Secondary | ICD-10-CM | POA: Diagnosis not present

## 2013-05-20 DIAGNOSIS — B1920 Unspecified viral hepatitis C without hepatic coma: Secondary | ICD-10-CM | POA: Diagnosis not present

## 2013-05-20 DIAGNOSIS — Z01818 Encounter for other preprocedural examination: Secondary | ICD-10-CM | POA: Diagnosis not present

## 2013-05-20 DIAGNOSIS — R109 Unspecified abdominal pain: Secondary | ICD-10-CM | POA: Diagnosis not present

## 2013-07-12 DIAGNOSIS — J449 Chronic obstructive pulmonary disease, unspecified: Secondary | ICD-10-CM | POA: Diagnosis not present

## 2013-08-03 DIAGNOSIS — F064 Anxiety disorder due to known physiological condition: Secondary | ICD-10-CM | POA: Diagnosis not present

## 2013-10-05 DIAGNOSIS — K449 Diaphragmatic hernia without obstruction or gangrene: Secondary | ICD-10-CM | POA: Diagnosis not present

## 2013-10-05 DIAGNOSIS — Z86711 Personal history of pulmonary embolism: Secondary | ICD-10-CM | POA: Diagnosis not present

## 2013-10-05 DIAGNOSIS — R0902 Hypoxemia: Secondary | ICD-10-CM | POA: Diagnosis not present

## 2013-10-05 DIAGNOSIS — E119 Type 2 diabetes mellitus without complications: Secondary | ICD-10-CM | POA: Diagnosis not present

## 2013-10-05 DIAGNOSIS — Z87891 Personal history of nicotine dependence: Secondary | ICD-10-CM | POA: Diagnosis not present

## 2013-10-05 DIAGNOSIS — Z888 Allergy status to other drugs, medicaments and biological substances status: Secondary | ICD-10-CM | POA: Diagnosis not present

## 2013-10-05 DIAGNOSIS — E785 Hyperlipidemia, unspecified: Secondary | ICD-10-CM | POA: Diagnosis not present

## 2013-10-05 DIAGNOSIS — N400 Enlarged prostate without lower urinary tract symptoms: Secondary | ICD-10-CM | POA: Diagnosis not present

## 2013-10-05 DIAGNOSIS — R062 Wheezing: Secondary | ICD-10-CM | POA: Diagnosis not present

## 2013-10-05 DIAGNOSIS — I10 Essential (primary) hypertension: Secondary | ICD-10-CM | POA: Diagnosis not present

## 2013-10-05 DIAGNOSIS — B182 Chronic viral hepatitis C: Secondary | ICD-10-CM | POA: Diagnosis not present

## 2013-10-05 DIAGNOSIS — Z79899 Other long term (current) drug therapy: Secondary | ICD-10-CM | POA: Diagnosis not present

## 2013-10-05 DIAGNOSIS — J441 Chronic obstructive pulmonary disease with (acute) exacerbation: Secondary | ICD-10-CM | POA: Diagnosis not present

## 2013-10-05 DIAGNOSIS — Z91013 Allergy to seafood: Secondary | ICD-10-CM | POA: Diagnosis not present

## 2013-10-05 DIAGNOSIS — I498 Other specified cardiac arrhythmias: Secondary | ICD-10-CM | POA: Diagnosis not present

## 2013-10-06 DIAGNOSIS — N400 Enlarged prostate without lower urinary tract symptoms: Secondary | ICD-10-CM | POA: Diagnosis not present

## 2013-10-06 DIAGNOSIS — J441 Chronic obstructive pulmonary disease with (acute) exacerbation: Secondary | ICD-10-CM | POA: Diagnosis not present

## 2013-10-06 DIAGNOSIS — Z86711 Personal history of pulmonary embolism: Secondary | ICD-10-CM | POA: Diagnosis not present

## 2013-10-06 DIAGNOSIS — R062 Wheezing: Secondary | ICD-10-CM | POA: Diagnosis not present

## 2013-10-06 DIAGNOSIS — B182 Chronic viral hepatitis C: Secondary | ICD-10-CM | POA: Diagnosis not present

## 2013-10-06 DIAGNOSIS — K449 Diaphragmatic hernia without obstruction or gangrene: Secondary | ICD-10-CM | POA: Diagnosis not present

## 2013-10-06 DIAGNOSIS — E119 Type 2 diabetes mellitus without complications: Secondary | ICD-10-CM | POA: Diagnosis not present

## 2013-10-06 DIAGNOSIS — J449 Chronic obstructive pulmonary disease, unspecified: Secondary | ICD-10-CM | POA: Diagnosis not present

## 2013-10-07 DIAGNOSIS — N400 Enlarged prostate without lower urinary tract symptoms: Secondary | ICD-10-CM | POA: Diagnosis not present

## 2013-10-07 DIAGNOSIS — B182 Chronic viral hepatitis C: Secondary | ICD-10-CM | POA: Diagnosis not present

## 2013-10-07 DIAGNOSIS — K449 Diaphragmatic hernia without obstruction or gangrene: Secondary | ICD-10-CM | POA: Diagnosis not present

## 2013-10-07 DIAGNOSIS — J441 Chronic obstructive pulmonary disease with (acute) exacerbation: Secondary | ICD-10-CM | POA: Diagnosis not present

## 2013-10-07 DIAGNOSIS — Z86711 Personal history of pulmonary embolism: Secondary | ICD-10-CM | POA: Diagnosis not present

## 2013-10-07 DIAGNOSIS — I219 Acute myocardial infarction, unspecified: Secondary | ICD-10-CM | POA: Diagnosis not present

## 2013-10-07 DIAGNOSIS — E119 Type 2 diabetes mellitus without complications: Secondary | ICD-10-CM | POA: Diagnosis not present

## 2013-10-08 DIAGNOSIS — N400 Enlarged prostate without lower urinary tract symptoms: Secondary | ICD-10-CM | POA: Diagnosis not present

## 2013-10-08 DIAGNOSIS — J441 Chronic obstructive pulmonary disease with (acute) exacerbation: Secondary | ICD-10-CM | POA: Diagnosis not present

## 2013-10-08 DIAGNOSIS — R062 Wheezing: Secondary | ICD-10-CM | POA: Diagnosis not present

## 2013-10-08 DIAGNOSIS — B182 Chronic viral hepatitis C: Secondary | ICD-10-CM | POA: Diagnosis not present

## 2013-10-08 DIAGNOSIS — K449 Diaphragmatic hernia without obstruction or gangrene: Secondary | ICD-10-CM | POA: Diagnosis not present

## 2013-10-08 DIAGNOSIS — Z86711 Personal history of pulmonary embolism: Secondary | ICD-10-CM | POA: Diagnosis not present

## 2013-10-08 DIAGNOSIS — E119 Type 2 diabetes mellitus without complications: Secondary | ICD-10-CM | POA: Diagnosis not present

## 2013-11-03 DIAGNOSIS — F064 Anxiety disorder due to known physiological condition: Secondary | ICD-10-CM | POA: Diagnosis not present

## 2013-11-05 DIAGNOSIS — J441 Chronic obstructive pulmonary disease with (acute) exacerbation: Secondary | ICD-10-CM | POA: Diagnosis not present

## 2013-11-05 DIAGNOSIS — E119 Type 2 diabetes mellitus without complications: Secondary | ICD-10-CM | POA: Diagnosis not present

## 2013-11-05 DIAGNOSIS — Z8489 Family history of other specified conditions: Secondary | ICD-10-CM | POA: Diagnosis not present

## 2013-11-05 DIAGNOSIS — F341 Dysthymic disorder: Secondary | ICD-10-CM | POA: Diagnosis not present

## 2013-11-05 DIAGNOSIS — I10 Essential (primary) hypertension: Secondary | ICD-10-CM | POA: Diagnosis not present

## 2013-11-05 DIAGNOSIS — K449 Diaphragmatic hernia without obstruction or gangrene: Secondary | ICD-10-CM | POA: Diagnosis not present

## 2013-11-05 DIAGNOSIS — N400 Enlarged prostate without lower urinary tract symptoms: Secondary | ICD-10-CM | POA: Diagnosis not present

## 2013-11-05 DIAGNOSIS — R0602 Shortness of breath: Secondary | ICD-10-CM | POA: Diagnosis not present

## 2013-11-05 DIAGNOSIS — Z79899 Other long term (current) drug therapy: Secondary | ICD-10-CM | POA: Diagnosis not present

## 2013-11-05 DIAGNOSIS — Z86711 Personal history of pulmonary embolism: Secondary | ICD-10-CM | POA: Diagnosis not present

## 2013-11-05 DIAGNOSIS — B182 Chronic viral hepatitis C: Secondary | ICD-10-CM | POA: Diagnosis not present

## 2013-11-05 DIAGNOSIS — Z9109 Other allergy status, other than to drugs and biological substances: Secondary | ICD-10-CM | POA: Diagnosis not present

## 2013-11-05 DIAGNOSIS — IMO0002 Reserved for concepts with insufficient information to code with codable children: Secondary | ICD-10-CM | POA: Diagnosis not present

## 2013-11-05 DIAGNOSIS — E785 Hyperlipidemia, unspecified: Secondary | ICD-10-CM | POA: Diagnosis not present

## 2013-11-05 DIAGNOSIS — J438 Other emphysema: Secondary | ICD-10-CM | POA: Diagnosis not present

## 2013-11-05 DIAGNOSIS — Z8249 Family history of ischemic heart disease and other diseases of the circulatory system: Secondary | ICD-10-CM | POA: Diagnosis not present

## 2013-11-05 DIAGNOSIS — Z87891 Personal history of nicotine dependence: Secondary | ICD-10-CM | POA: Diagnosis not present

## 2013-11-06 DIAGNOSIS — Z86711 Personal history of pulmonary embolism: Secondary | ICD-10-CM | POA: Diagnosis not present

## 2013-11-06 DIAGNOSIS — R0602 Shortness of breath: Secondary | ICD-10-CM | POA: Diagnosis not present

## 2013-11-06 DIAGNOSIS — E119 Type 2 diabetes mellitus without complications: Secondary | ICD-10-CM | POA: Diagnosis not present

## 2013-11-06 DIAGNOSIS — J441 Chronic obstructive pulmonary disease with (acute) exacerbation: Secondary | ICD-10-CM | POA: Diagnosis not present

## 2013-11-06 DIAGNOSIS — K449 Diaphragmatic hernia without obstruction or gangrene: Secondary | ICD-10-CM | POA: Diagnosis not present

## 2013-12-15 DIAGNOSIS — L0231 Cutaneous abscess of buttock: Secondary | ICD-10-CM | POA: Diagnosis not present

## 2013-12-15 DIAGNOSIS — L03317 Cellulitis of buttock: Secondary | ICD-10-CM | POA: Diagnosis not present

## 2013-12-16 DIAGNOSIS — R0609 Other forms of dyspnea: Secondary | ICD-10-CM | POA: Diagnosis not present

## 2013-12-16 DIAGNOSIS — R6889 Other general symptoms and signs: Secondary | ICD-10-CM | POA: Diagnosis not present

## 2013-12-16 DIAGNOSIS — E7800 Pure hypercholesterolemia, unspecified: Secondary | ICD-10-CM | POA: Diagnosis present

## 2013-12-16 DIAGNOSIS — N289 Disorder of kidney and ureter, unspecified: Secondary | ICD-10-CM | POA: Diagnosis not present

## 2013-12-16 DIAGNOSIS — L0231 Cutaneous abscess of buttock: Secondary | ICD-10-CM | POA: Diagnosis not present

## 2013-12-16 DIAGNOSIS — I10 Essential (primary) hypertension: Secondary | ICD-10-CM | POA: Diagnosis present

## 2013-12-16 DIAGNOSIS — F319 Bipolar disorder, unspecified: Secondary | ICD-10-CM | POA: Diagnosis present

## 2013-12-16 DIAGNOSIS — N400 Enlarged prostate without lower urinary tract symptoms: Secondary | ICD-10-CM | POA: Diagnosis present

## 2013-12-16 DIAGNOSIS — G8929 Other chronic pain: Secondary | ICD-10-CM | POA: Diagnosis present

## 2013-12-16 DIAGNOSIS — B1920 Unspecified viral hepatitis C without hepatic coma: Secondary | ICD-10-CM | POA: Diagnosis present

## 2013-12-16 DIAGNOSIS — J9600 Acute respiratory failure, unspecified whether with hypoxia or hypercapnia: Secondary | ICD-10-CM | POA: Diagnosis not present

## 2013-12-16 DIAGNOSIS — A419 Sepsis, unspecified organism: Secondary | ICD-10-CM | POA: Diagnosis not present

## 2013-12-16 DIAGNOSIS — I2510 Atherosclerotic heart disease of native coronary artery without angina pectoris: Secondary | ICD-10-CM | POA: Diagnosis present

## 2013-12-16 DIAGNOSIS — Z87891 Personal history of nicotine dependence: Secondary | ICD-10-CM | POA: Diagnosis not present

## 2013-12-16 DIAGNOSIS — E43 Unspecified severe protein-calorie malnutrition: Secondary | ICD-10-CM | POA: Diagnosis present

## 2013-12-16 DIAGNOSIS — K449 Diaphragmatic hernia without obstruction or gangrene: Secondary | ICD-10-CM | POA: Diagnosis present

## 2013-12-16 DIAGNOSIS — A498 Other bacterial infections of unspecified site: Secondary | ICD-10-CM | POA: Diagnosis present

## 2013-12-16 DIAGNOSIS — E871 Hypo-osmolality and hyponatremia: Secondary | ICD-10-CM | POA: Diagnosis present

## 2013-12-16 DIAGNOSIS — R0602 Shortness of breath: Secondary | ICD-10-CM | POA: Diagnosis not present

## 2013-12-16 DIAGNOSIS — M545 Low back pain, unspecified: Secondary | ICD-10-CM | POA: Diagnosis present

## 2013-12-16 DIAGNOSIS — A429 Actinomycosis, unspecified: Secondary | ICD-10-CM | POA: Diagnosis not present

## 2013-12-16 DIAGNOSIS — J441 Chronic obstructive pulmonary disease with (acute) exacerbation: Secondary | ICD-10-CM | POA: Diagnosis not present

## 2013-12-16 DIAGNOSIS — I509 Heart failure, unspecified: Secondary | ICD-10-CM | POA: Diagnosis present

## 2013-12-16 DIAGNOSIS — J90 Pleural effusion, not elsewhere classified: Secondary | ICD-10-CM | POA: Diagnosis not present

## 2013-12-16 DIAGNOSIS — E785 Hyperlipidemia, unspecified: Secondary | ICD-10-CM | POA: Diagnosis present

## 2013-12-16 DIAGNOSIS — I4891 Unspecified atrial fibrillation: Secondary | ICD-10-CM | POA: Diagnosis present

## 2013-12-16 DIAGNOSIS — IMO0002 Reserved for concepts with insufficient information to code with codable children: Secondary | ICD-10-CM | POA: Diagnosis not present

## 2013-12-16 DIAGNOSIS — A4102 Sepsis due to Methicillin resistant Staphylococcus aureus: Secondary | ICD-10-CM | POA: Diagnosis not present

## 2013-12-16 DIAGNOSIS — E119 Type 2 diabetes mellitus without complications: Secondary | ICD-10-CM | POA: Diagnosis present

## 2013-12-16 DIAGNOSIS — L03317 Cellulitis of buttock: Secondary | ICD-10-CM | POA: Diagnosis present

## 2013-12-16 DIAGNOSIS — N390 Urinary tract infection, site not specified: Secondary | ICD-10-CM | POA: Diagnosis present

## 2013-12-16 DIAGNOSIS — E41 Nutritional marasmus: Secondary | ICD-10-CM | POA: Diagnosis not present

## 2013-12-17 DIAGNOSIS — E41 Nutritional marasmus: Secondary | ICD-10-CM | POA: Diagnosis not present

## 2013-12-17 DIAGNOSIS — A4102 Sepsis due to Methicillin resistant Staphylococcus aureus: Secondary | ICD-10-CM | POA: Diagnosis not present

## 2013-12-17 DIAGNOSIS — A429 Actinomycosis, unspecified: Secondary | ICD-10-CM | POA: Diagnosis not present

## 2013-12-17 DIAGNOSIS — L0231 Cutaneous abscess of buttock: Secondary | ICD-10-CM | POA: Diagnosis not present

## 2013-12-17 DIAGNOSIS — J9600 Acute respiratory failure, unspecified whether with hypoxia or hypercapnia: Secondary | ICD-10-CM | POA: Diagnosis not present

## 2013-12-20 DIAGNOSIS — L0231 Cutaneous abscess of buttock: Secondary | ICD-10-CM | POA: Diagnosis not present

## 2013-12-22 DIAGNOSIS — J90 Pleural effusion, not elsewhere classified: Secondary | ICD-10-CM | POA: Diagnosis not present

## 2013-12-23 DIAGNOSIS — E41 Nutritional marasmus: Secondary | ICD-10-CM | POA: Diagnosis not present

## 2013-12-23 DIAGNOSIS — A429 Actinomycosis, unspecified: Secondary | ICD-10-CM | POA: Diagnosis not present

## 2013-12-23 DIAGNOSIS — J9600 Acute respiratory failure, unspecified whether with hypoxia or hypercapnia: Secondary | ICD-10-CM | POA: Diagnosis not present

## 2013-12-23 DIAGNOSIS — L0231 Cutaneous abscess of buttock: Secondary | ICD-10-CM | POA: Diagnosis not present

## 2013-12-23 DIAGNOSIS — A4102 Sepsis due to Methicillin resistant Staphylococcus aureus: Secondary | ICD-10-CM | POA: Diagnosis not present

## 2013-12-24 DIAGNOSIS — E43 Unspecified severe protein-calorie malnutrition: Secondary | ICD-10-CM | POA: Diagnosis not present

## 2013-12-24 DIAGNOSIS — A498 Other bacterial infections of unspecified site: Secondary | ICD-10-CM | POA: Diagnosis not present

## 2013-12-24 DIAGNOSIS — A4902 Methicillin resistant Staphylococcus aureus infection, unspecified site: Secondary | ICD-10-CM | POA: Diagnosis not present

## 2013-12-24 DIAGNOSIS — F319 Bipolar disorder, unspecified: Secondary | ICD-10-CM | POA: Diagnosis not present

## 2013-12-24 DIAGNOSIS — I2510 Atherosclerotic heart disease of native coronary artery without angina pectoris: Secondary | ICD-10-CM | POA: Diagnosis not present

## 2013-12-24 DIAGNOSIS — I4891 Unspecified atrial fibrillation: Secondary | ICD-10-CM | POA: Diagnosis not present

## 2013-12-24 DIAGNOSIS — B9562 Methicillin resistant Staphylococcus aureus infection as the cause of diseases classified elsewhere: Secondary | ICD-10-CM | POA: Diagnosis not present

## 2013-12-24 DIAGNOSIS — E119 Type 2 diabetes mellitus without complications: Secondary | ICD-10-CM | POA: Diagnosis not present

## 2013-12-24 DIAGNOSIS — J449 Chronic obstructive pulmonary disease, unspecified: Secondary | ICD-10-CM | POA: Diagnosis not present

## 2013-12-24 DIAGNOSIS — L0231 Cutaneous abscess of buttock: Secondary | ICD-10-CM | POA: Diagnosis not present

## 2013-12-24 DIAGNOSIS — N390 Urinary tract infection, site not specified: Secondary | ICD-10-CM | POA: Diagnosis not present

## 2013-12-24 DIAGNOSIS — B9620 Unspecified Escherichia coli [E. coli] as the cause of diseases classified elsewhere: Secondary | ICD-10-CM | POA: Diagnosis not present

## 2013-12-24 DIAGNOSIS — L03317 Cellulitis of buttock: Secondary | ICD-10-CM | POA: Diagnosis not present

## 2013-12-25 DIAGNOSIS — L0231 Cutaneous abscess of buttock: Secondary | ICD-10-CM | POA: Diagnosis not present

## 2013-12-25 DIAGNOSIS — B9562 Methicillin resistant Staphylococcus aureus infection as the cause of diseases classified elsewhere: Secondary | ICD-10-CM | POA: Diagnosis not present

## 2013-12-25 DIAGNOSIS — N390 Urinary tract infection, site not specified: Secondary | ICD-10-CM | POA: Diagnosis not present

## 2013-12-25 DIAGNOSIS — I4891 Unspecified atrial fibrillation: Secondary | ICD-10-CM | POA: Diagnosis not present

## 2013-12-25 DIAGNOSIS — E119 Type 2 diabetes mellitus without complications: Secondary | ICD-10-CM | POA: Diagnosis not present

## 2013-12-25 DIAGNOSIS — B9620 Unspecified Escherichia coli [E. coli] as the cause of diseases classified elsewhere: Secondary | ICD-10-CM | POA: Diagnosis not present

## 2013-12-27 DIAGNOSIS — N390 Urinary tract infection, site not specified: Secondary | ICD-10-CM | POA: Diagnosis not present

## 2013-12-27 DIAGNOSIS — B9562 Methicillin resistant Staphylococcus aureus infection as the cause of diseases classified elsewhere: Secondary | ICD-10-CM | POA: Diagnosis not present

## 2013-12-27 DIAGNOSIS — E119 Type 2 diabetes mellitus without complications: Secondary | ICD-10-CM | POA: Diagnosis not present

## 2013-12-27 DIAGNOSIS — L0231 Cutaneous abscess of buttock: Secondary | ICD-10-CM | POA: Diagnosis not present

## 2013-12-27 DIAGNOSIS — B9620 Unspecified Escherichia coli [E. coli] as the cause of diseases classified elsewhere: Secondary | ICD-10-CM | POA: Diagnosis not present

## 2013-12-27 DIAGNOSIS — I4891 Unspecified atrial fibrillation: Secondary | ICD-10-CM | POA: Diagnosis not present

## 2013-12-28 DIAGNOSIS — I4891 Unspecified atrial fibrillation: Secondary | ICD-10-CM | POA: Diagnosis not present

## 2013-12-28 DIAGNOSIS — B9562 Methicillin resistant Staphylococcus aureus infection as the cause of diseases classified elsewhere: Secondary | ICD-10-CM | POA: Diagnosis not present

## 2013-12-28 DIAGNOSIS — E119 Type 2 diabetes mellitus without complications: Secondary | ICD-10-CM | POA: Diagnosis not present

## 2013-12-28 DIAGNOSIS — B9620 Unspecified Escherichia coli [E. coli] as the cause of diseases classified elsewhere: Secondary | ICD-10-CM | POA: Diagnosis not present

## 2013-12-28 DIAGNOSIS — N390 Urinary tract infection, site not specified: Secondary | ICD-10-CM | POA: Diagnosis not present

## 2013-12-28 DIAGNOSIS — L0231 Cutaneous abscess of buttock: Secondary | ICD-10-CM | POA: Diagnosis not present

## 2013-12-29 DIAGNOSIS — E119 Type 2 diabetes mellitus without complications: Secondary | ICD-10-CM | POA: Diagnosis not present

## 2013-12-29 DIAGNOSIS — L0231 Cutaneous abscess of buttock: Secondary | ICD-10-CM | POA: Diagnosis not present

## 2013-12-29 DIAGNOSIS — B9620 Unspecified Escherichia coli [E. coli] as the cause of diseases classified elsewhere: Secondary | ICD-10-CM | POA: Diagnosis not present

## 2013-12-29 DIAGNOSIS — I4891 Unspecified atrial fibrillation: Secondary | ICD-10-CM | POA: Diagnosis not present

## 2013-12-29 DIAGNOSIS — N390 Urinary tract infection, site not specified: Secondary | ICD-10-CM | POA: Diagnosis not present

## 2013-12-29 DIAGNOSIS — B9562 Methicillin resistant Staphylococcus aureus infection as the cause of diseases classified elsewhere: Secondary | ICD-10-CM | POA: Diagnosis not present

## 2013-12-30 DIAGNOSIS — N390 Urinary tract infection, site not specified: Secondary | ICD-10-CM | POA: Diagnosis not present

## 2013-12-30 DIAGNOSIS — E119 Type 2 diabetes mellitus without complications: Secondary | ICD-10-CM | POA: Diagnosis not present

## 2013-12-30 DIAGNOSIS — I4891 Unspecified atrial fibrillation: Secondary | ICD-10-CM | POA: Diagnosis not present

## 2013-12-30 DIAGNOSIS — L0231 Cutaneous abscess of buttock: Secondary | ICD-10-CM | POA: Diagnosis not present

## 2013-12-30 DIAGNOSIS — B9620 Unspecified Escherichia coli [E. coli] as the cause of diseases classified elsewhere: Secondary | ICD-10-CM | POA: Diagnosis not present

## 2013-12-30 DIAGNOSIS — L03317 Cellulitis of buttock: Secondary | ICD-10-CM | POA: Diagnosis not present

## 2013-12-30 DIAGNOSIS — R197 Diarrhea, unspecified: Secondary | ICD-10-CM | POA: Diagnosis not present

## 2013-12-30 DIAGNOSIS — B9562 Methicillin resistant Staphylococcus aureus infection as the cause of diseases classified elsewhere: Secondary | ICD-10-CM | POA: Diagnosis not present

## 2013-12-31 DIAGNOSIS — B9562 Methicillin resistant Staphylococcus aureus infection as the cause of diseases classified elsewhere: Secondary | ICD-10-CM | POA: Diagnosis not present

## 2013-12-31 DIAGNOSIS — I4891 Unspecified atrial fibrillation: Secondary | ICD-10-CM | POA: Diagnosis not present

## 2013-12-31 DIAGNOSIS — L0231 Cutaneous abscess of buttock: Secondary | ICD-10-CM | POA: Diagnosis not present

## 2013-12-31 DIAGNOSIS — E119 Type 2 diabetes mellitus without complications: Secondary | ICD-10-CM | POA: Diagnosis not present

## 2013-12-31 DIAGNOSIS — N390 Urinary tract infection, site not specified: Secondary | ICD-10-CM | POA: Diagnosis not present

## 2013-12-31 DIAGNOSIS — B9620 Unspecified Escherichia coli [E. coli] as the cause of diseases classified elsewhere: Secondary | ICD-10-CM | POA: Diagnosis not present

## 2014-01-04 DIAGNOSIS — B9620 Unspecified Escherichia coli [E. coli] as the cause of diseases classified elsewhere: Secondary | ICD-10-CM | POA: Diagnosis not present

## 2014-01-04 DIAGNOSIS — E119 Type 2 diabetes mellitus without complications: Secondary | ICD-10-CM | POA: Diagnosis not present

## 2014-01-04 DIAGNOSIS — N390 Urinary tract infection, site not specified: Secondary | ICD-10-CM | POA: Diagnosis not present

## 2014-01-04 DIAGNOSIS — L0231 Cutaneous abscess of buttock: Secondary | ICD-10-CM | POA: Diagnosis not present

## 2014-01-04 DIAGNOSIS — I4891 Unspecified atrial fibrillation: Secondary | ICD-10-CM | POA: Diagnosis not present

## 2014-01-04 DIAGNOSIS — B9562 Methicillin resistant Staphylococcus aureus infection as the cause of diseases classified elsewhere: Secondary | ICD-10-CM | POA: Diagnosis not present

## 2014-01-05 DIAGNOSIS — N390 Urinary tract infection, site not specified: Secondary | ICD-10-CM | POA: Diagnosis not present

## 2014-01-05 DIAGNOSIS — E43 Unspecified severe protein-calorie malnutrition: Secondary | ICD-10-CM | POA: Diagnosis not present

## 2014-01-05 DIAGNOSIS — I4891 Unspecified atrial fibrillation: Secondary | ICD-10-CM | POA: Diagnosis not present

## 2014-01-05 DIAGNOSIS — J449 Chronic obstructive pulmonary disease, unspecified: Secondary | ICD-10-CM | POA: Diagnosis not present

## 2014-01-05 DIAGNOSIS — E119 Type 2 diabetes mellitus without complications: Secondary | ICD-10-CM | POA: Diagnosis not present

## 2014-01-05 DIAGNOSIS — B9620 Unspecified Escherichia coli [E. coli] as the cause of diseases classified elsewhere: Secondary | ICD-10-CM | POA: Diagnosis not present

## 2014-01-05 DIAGNOSIS — B9562 Methicillin resistant Staphylococcus aureus infection as the cause of diseases classified elsewhere: Secondary | ICD-10-CM | POA: Diagnosis not present

## 2014-01-05 DIAGNOSIS — L0231 Cutaneous abscess of buttock: Secondary | ICD-10-CM | POA: Diagnosis not present

## 2014-01-07 DIAGNOSIS — N390 Urinary tract infection, site not specified: Secondary | ICD-10-CM | POA: Diagnosis not present

## 2014-01-07 DIAGNOSIS — E119 Type 2 diabetes mellitus without complications: Secondary | ICD-10-CM | POA: Diagnosis not present

## 2014-01-07 DIAGNOSIS — B9620 Unspecified Escherichia coli [E. coli] as the cause of diseases classified elsewhere: Secondary | ICD-10-CM | POA: Diagnosis not present

## 2014-01-07 DIAGNOSIS — B9562 Methicillin resistant Staphylococcus aureus infection as the cause of diseases classified elsewhere: Secondary | ICD-10-CM | POA: Diagnosis not present

## 2014-01-07 DIAGNOSIS — L0231 Cutaneous abscess of buttock: Secondary | ICD-10-CM | POA: Diagnosis not present

## 2014-01-07 DIAGNOSIS — I4891 Unspecified atrial fibrillation: Secondary | ICD-10-CM | POA: Diagnosis not present

## 2014-01-10 DIAGNOSIS — L0231 Cutaneous abscess of buttock: Secondary | ICD-10-CM | POA: Diagnosis not present

## 2014-01-12 DIAGNOSIS — N390 Urinary tract infection, site not specified: Secondary | ICD-10-CM | POA: Diagnosis not present

## 2014-01-12 DIAGNOSIS — E119 Type 2 diabetes mellitus without complications: Secondary | ICD-10-CM | POA: Diagnosis not present

## 2014-01-12 DIAGNOSIS — I4891 Unspecified atrial fibrillation: Secondary | ICD-10-CM | POA: Diagnosis not present

## 2014-01-12 DIAGNOSIS — B9562 Methicillin resistant Staphylococcus aureus infection as the cause of diseases classified elsewhere: Secondary | ICD-10-CM | POA: Diagnosis not present

## 2014-01-12 DIAGNOSIS — L0231 Cutaneous abscess of buttock: Secondary | ICD-10-CM | POA: Diagnosis not present

## 2014-01-12 DIAGNOSIS — B9620 Unspecified Escherichia coli [E. coli] as the cause of diseases classified elsewhere: Secondary | ICD-10-CM | POA: Diagnosis not present

## 2014-01-14 DIAGNOSIS — I4891 Unspecified atrial fibrillation: Secondary | ICD-10-CM | POA: Diagnosis not present

## 2014-01-14 DIAGNOSIS — E119 Type 2 diabetes mellitus without complications: Secondary | ICD-10-CM | POA: Diagnosis not present

## 2014-01-14 DIAGNOSIS — N390 Urinary tract infection, site not specified: Secondary | ICD-10-CM | POA: Diagnosis not present

## 2014-01-14 DIAGNOSIS — B9620 Unspecified Escherichia coli [E. coli] as the cause of diseases classified elsewhere: Secondary | ICD-10-CM | POA: Diagnosis not present

## 2014-01-14 DIAGNOSIS — B9562 Methicillin resistant Staphylococcus aureus infection as the cause of diseases classified elsewhere: Secondary | ICD-10-CM | POA: Diagnosis not present

## 2014-01-14 DIAGNOSIS — L0231 Cutaneous abscess of buttock: Secondary | ICD-10-CM | POA: Diagnosis not present

## 2014-01-17 DIAGNOSIS — I4891 Unspecified atrial fibrillation: Secondary | ICD-10-CM | POA: Diagnosis not present

## 2014-01-17 DIAGNOSIS — L0231 Cutaneous abscess of buttock: Secondary | ICD-10-CM | POA: Diagnosis not present

## 2014-01-17 DIAGNOSIS — B9620 Unspecified Escherichia coli [E. coli] as the cause of diseases classified elsewhere: Secondary | ICD-10-CM | POA: Diagnosis not present

## 2014-01-17 DIAGNOSIS — B9562 Methicillin resistant Staphylococcus aureus infection as the cause of diseases classified elsewhere: Secondary | ICD-10-CM | POA: Diagnosis not present

## 2014-01-17 DIAGNOSIS — N390 Urinary tract infection, site not specified: Secondary | ICD-10-CM | POA: Diagnosis not present

## 2014-01-17 DIAGNOSIS — E119 Type 2 diabetes mellitus without complications: Secondary | ICD-10-CM | POA: Diagnosis not present

## 2014-01-18 DIAGNOSIS — F064 Anxiety disorder due to known physiological condition: Secondary | ICD-10-CM | POA: Diagnosis not present

## 2014-01-19 DIAGNOSIS — B9562 Methicillin resistant Staphylococcus aureus infection as the cause of diseases classified elsewhere: Secondary | ICD-10-CM | POA: Diagnosis not present

## 2014-01-19 DIAGNOSIS — L0231 Cutaneous abscess of buttock: Secondary | ICD-10-CM | POA: Diagnosis not present

## 2014-01-19 DIAGNOSIS — I4891 Unspecified atrial fibrillation: Secondary | ICD-10-CM | POA: Diagnosis not present

## 2014-01-19 DIAGNOSIS — B9620 Unspecified Escherichia coli [E. coli] as the cause of diseases classified elsewhere: Secondary | ICD-10-CM | POA: Diagnosis not present

## 2014-01-19 DIAGNOSIS — N390 Urinary tract infection, site not specified: Secondary | ICD-10-CM | POA: Diagnosis not present

## 2014-01-19 DIAGNOSIS — E119 Type 2 diabetes mellitus without complications: Secondary | ICD-10-CM | POA: Diagnosis not present

## 2014-01-21 DIAGNOSIS — N390 Urinary tract infection, site not specified: Secondary | ICD-10-CM | POA: Diagnosis not present

## 2014-01-21 DIAGNOSIS — B9562 Methicillin resistant Staphylococcus aureus infection as the cause of diseases classified elsewhere: Secondary | ICD-10-CM | POA: Diagnosis not present

## 2014-01-21 DIAGNOSIS — I4891 Unspecified atrial fibrillation: Secondary | ICD-10-CM | POA: Diagnosis not present

## 2014-01-21 DIAGNOSIS — L0231 Cutaneous abscess of buttock: Secondary | ICD-10-CM | POA: Diagnosis not present

## 2014-01-21 DIAGNOSIS — E119 Type 2 diabetes mellitus without complications: Secondary | ICD-10-CM | POA: Diagnosis not present

## 2014-01-21 DIAGNOSIS — B9620 Unspecified Escherichia coli [E. coli] as the cause of diseases classified elsewhere: Secondary | ICD-10-CM | POA: Diagnosis not present

## 2014-01-24 DIAGNOSIS — B9620 Unspecified Escherichia coli [E. coli] as the cause of diseases classified elsewhere: Secondary | ICD-10-CM | POA: Diagnosis not present

## 2014-01-24 DIAGNOSIS — N390 Urinary tract infection, site not specified: Secondary | ICD-10-CM | POA: Diagnosis not present

## 2014-01-24 DIAGNOSIS — B9562 Methicillin resistant Staphylococcus aureus infection as the cause of diseases classified elsewhere: Secondary | ICD-10-CM | POA: Diagnosis not present

## 2014-01-24 DIAGNOSIS — I4891 Unspecified atrial fibrillation: Secondary | ICD-10-CM | POA: Diagnosis not present

## 2014-01-24 DIAGNOSIS — L0231 Cutaneous abscess of buttock: Secondary | ICD-10-CM | POA: Diagnosis not present

## 2014-01-24 DIAGNOSIS — E119 Type 2 diabetes mellitus without complications: Secondary | ICD-10-CM | POA: Diagnosis not present

## 2014-01-26 DIAGNOSIS — E119 Type 2 diabetes mellitus without complications: Secondary | ICD-10-CM | POA: Diagnosis not present

## 2014-01-26 DIAGNOSIS — N390 Urinary tract infection, site not specified: Secondary | ICD-10-CM | POA: Diagnosis not present

## 2014-01-26 DIAGNOSIS — I4891 Unspecified atrial fibrillation: Secondary | ICD-10-CM | POA: Diagnosis not present

## 2014-01-26 DIAGNOSIS — B9620 Unspecified Escherichia coli [E. coli] as the cause of diseases classified elsewhere: Secondary | ICD-10-CM | POA: Diagnosis not present

## 2014-01-26 DIAGNOSIS — L0231 Cutaneous abscess of buttock: Secondary | ICD-10-CM | POA: Diagnosis not present

## 2014-01-26 DIAGNOSIS — B9562 Methicillin resistant Staphylococcus aureus infection as the cause of diseases classified elsewhere: Secondary | ICD-10-CM | POA: Diagnosis not present

## 2014-01-28 DIAGNOSIS — B9620 Unspecified Escherichia coli [E. coli] as the cause of diseases classified elsewhere: Secondary | ICD-10-CM | POA: Diagnosis not present

## 2014-01-28 DIAGNOSIS — L0231 Cutaneous abscess of buttock: Secondary | ICD-10-CM | POA: Diagnosis not present

## 2014-01-28 DIAGNOSIS — I4891 Unspecified atrial fibrillation: Secondary | ICD-10-CM | POA: Diagnosis not present

## 2014-01-28 DIAGNOSIS — E119 Type 2 diabetes mellitus without complications: Secondary | ICD-10-CM | POA: Diagnosis not present

## 2014-01-28 DIAGNOSIS — B9562 Methicillin resistant Staphylococcus aureus infection as the cause of diseases classified elsewhere: Secondary | ICD-10-CM | POA: Diagnosis not present

## 2014-01-28 DIAGNOSIS — N390 Urinary tract infection, site not specified: Secondary | ICD-10-CM | POA: Diagnosis not present

## 2014-01-31 DIAGNOSIS — E119 Type 2 diabetes mellitus without complications: Secondary | ICD-10-CM | POA: Diagnosis not present

## 2014-01-31 DIAGNOSIS — B9562 Methicillin resistant Staphylococcus aureus infection as the cause of diseases classified elsewhere: Secondary | ICD-10-CM | POA: Diagnosis not present

## 2014-01-31 DIAGNOSIS — L0231 Cutaneous abscess of buttock: Secondary | ICD-10-CM | POA: Diagnosis not present

## 2014-01-31 DIAGNOSIS — B9620 Unspecified Escherichia coli [E. coli] as the cause of diseases classified elsewhere: Secondary | ICD-10-CM | POA: Diagnosis not present

## 2014-01-31 DIAGNOSIS — N390 Urinary tract infection, site not specified: Secondary | ICD-10-CM | POA: Diagnosis not present

## 2014-01-31 DIAGNOSIS — I4891 Unspecified atrial fibrillation: Secondary | ICD-10-CM | POA: Diagnosis not present

## 2014-02-02 DIAGNOSIS — L0231 Cutaneous abscess of buttock: Secondary | ICD-10-CM | POA: Diagnosis not present

## 2014-02-02 DIAGNOSIS — N390 Urinary tract infection, site not specified: Secondary | ICD-10-CM | POA: Diagnosis not present

## 2014-02-02 DIAGNOSIS — I4891 Unspecified atrial fibrillation: Secondary | ICD-10-CM | POA: Diagnosis not present

## 2014-02-02 DIAGNOSIS — E119 Type 2 diabetes mellitus without complications: Secondary | ICD-10-CM | POA: Diagnosis not present

## 2014-02-02 DIAGNOSIS — B9562 Methicillin resistant Staphylococcus aureus infection as the cause of diseases classified elsewhere: Secondary | ICD-10-CM | POA: Diagnosis not present

## 2014-02-02 DIAGNOSIS — B9620 Unspecified Escherichia coli [E. coli] as the cause of diseases classified elsewhere: Secondary | ICD-10-CM | POA: Diagnosis not present

## 2014-02-04 DIAGNOSIS — B9562 Methicillin resistant Staphylococcus aureus infection as the cause of diseases classified elsewhere: Secondary | ICD-10-CM | POA: Diagnosis not present

## 2014-02-04 DIAGNOSIS — N390 Urinary tract infection, site not specified: Secondary | ICD-10-CM | POA: Diagnosis not present

## 2014-02-04 DIAGNOSIS — B9620 Unspecified Escherichia coli [E. coli] as the cause of diseases classified elsewhere: Secondary | ICD-10-CM | POA: Diagnosis not present

## 2014-02-04 DIAGNOSIS — E119 Type 2 diabetes mellitus without complications: Secondary | ICD-10-CM | POA: Diagnosis not present

## 2014-02-04 DIAGNOSIS — L0231 Cutaneous abscess of buttock: Secondary | ICD-10-CM | POA: Diagnosis not present

## 2014-02-04 DIAGNOSIS — I4891 Unspecified atrial fibrillation: Secondary | ICD-10-CM | POA: Diagnosis not present

## 2014-02-07 DIAGNOSIS — L0231 Cutaneous abscess of buttock: Secondary | ICD-10-CM | POA: Diagnosis not present

## 2014-02-07 DIAGNOSIS — I4891 Unspecified atrial fibrillation: Secondary | ICD-10-CM | POA: Diagnosis not present

## 2014-02-07 DIAGNOSIS — B9562 Methicillin resistant Staphylococcus aureus infection as the cause of diseases classified elsewhere: Secondary | ICD-10-CM | POA: Diagnosis not present

## 2014-02-07 DIAGNOSIS — E119 Type 2 diabetes mellitus without complications: Secondary | ICD-10-CM | POA: Diagnosis not present

## 2014-02-07 DIAGNOSIS — B9620 Unspecified Escherichia coli [E. coli] as the cause of diseases classified elsewhere: Secondary | ICD-10-CM | POA: Diagnosis not present

## 2014-02-07 DIAGNOSIS — N390 Urinary tract infection, site not specified: Secondary | ICD-10-CM | POA: Diagnosis not present

## 2014-02-09 DIAGNOSIS — N390 Urinary tract infection, site not specified: Secondary | ICD-10-CM | POA: Diagnosis not present

## 2014-02-09 DIAGNOSIS — B9562 Methicillin resistant Staphylococcus aureus infection as the cause of diseases classified elsewhere: Secondary | ICD-10-CM | POA: Diagnosis not present

## 2014-02-09 DIAGNOSIS — E119 Type 2 diabetes mellitus without complications: Secondary | ICD-10-CM | POA: Diagnosis not present

## 2014-02-09 DIAGNOSIS — L0231 Cutaneous abscess of buttock: Secondary | ICD-10-CM | POA: Diagnosis not present

## 2014-02-09 DIAGNOSIS — I4891 Unspecified atrial fibrillation: Secondary | ICD-10-CM | POA: Diagnosis not present

## 2014-02-09 DIAGNOSIS — B9620 Unspecified Escherichia coli [E. coli] as the cause of diseases classified elsewhere: Secondary | ICD-10-CM | POA: Diagnosis not present

## 2014-02-11 DIAGNOSIS — E119 Type 2 diabetes mellitus without complications: Secondary | ICD-10-CM | POA: Diagnosis not present

## 2014-02-11 DIAGNOSIS — L0231 Cutaneous abscess of buttock: Secondary | ICD-10-CM | POA: Diagnosis not present

## 2014-02-11 DIAGNOSIS — B9620 Unspecified Escherichia coli [E. coli] as the cause of diseases classified elsewhere: Secondary | ICD-10-CM | POA: Diagnosis not present

## 2014-02-11 DIAGNOSIS — B9562 Methicillin resistant Staphylococcus aureus infection as the cause of diseases classified elsewhere: Secondary | ICD-10-CM | POA: Diagnosis not present

## 2014-02-11 DIAGNOSIS — N390 Urinary tract infection, site not specified: Secondary | ICD-10-CM | POA: Diagnosis not present

## 2014-02-11 DIAGNOSIS — I4891 Unspecified atrial fibrillation: Secondary | ICD-10-CM | POA: Diagnosis not present

## 2014-02-14 DIAGNOSIS — I4891 Unspecified atrial fibrillation: Secondary | ICD-10-CM | POA: Diagnosis not present

## 2014-02-14 DIAGNOSIS — L0231 Cutaneous abscess of buttock: Secondary | ICD-10-CM | POA: Diagnosis not present

## 2014-02-14 DIAGNOSIS — B9562 Methicillin resistant Staphylococcus aureus infection as the cause of diseases classified elsewhere: Secondary | ICD-10-CM | POA: Diagnosis not present

## 2014-02-14 DIAGNOSIS — E119 Type 2 diabetes mellitus without complications: Secondary | ICD-10-CM | POA: Diagnosis not present

## 2014-02-14 DIAGNOSIS — N390 Urinary tract infection, site not specified: Secondary | ICD-10-CM | POA: Diagnosis not present

## 2014-02-14 DIAGNOSIS — B9620 Unspecified Escherichia coli [E. coli] as the cause of diseases classified elsewhere: Secondary | ICD-10-CM | POA: Diagnosis not present

## 2014-02-16 DIAGNOSIS — N390 Urinary tract infection, site not specified: Secondary | ICD-10-CM | POA: Diagnosis not present

## 2014-02-16 DIAGNOSIS — B9620 Unspecified Escherichia coli [E. coli] as the cause of diseases classified elsewhere: Secondary | ICD-10-CM | POA: Diagnosis not present

## 2014-02-16 DIAGNOSIS — I4891 Unspecified atrial fibrillation: Secondary | ICD-10-CM | POA: Diagnosis not present

## 2014-02-16 DIAGNOSIS — B9562 Methicillin resistant Staphylococcus aureus infection as the cause of diseases classified elsewhere: Secondary | ICD-10-CM | POA: Diagnosis not present

## 2014-02-16 DIAGNOSIS — E119 Type 2 diabetes mellitus without complications: Secondary | ICD-10-CM | POA: Diagnosis not present

## 2014-02-16 DIAGNOSIS — L0231 Cutaneous abscess of buttock: Secondary | ICD-10-CM | POA: Diagnosis not present

## 2014-02-18 DIAGNOSIS — B9562 Methicillin resistant Staphylococcus aureus infection as the cause of diseases classified elsewhere: Secondary | ICD-10-CM | POA: Diagnosis not present

## 2014-02-18 DIAGNOSIS — B9620 Unspecified Escherichia coli [E. coli] as the cause of diseases classified elsewhere: Secondary | ICD-10-CM | POA: Diagnosis not present

## 2014-02-18 DIAGNOSIS — E119 Type 2 diabetes mellitus without complications: Secondary | ICD-10-CM | POA: Diagnosis not present

## 2014-02-18 DIAGNOSIS — I4891 Unspecified atrial fibrillation: Secondary | ICD-10-CM | POA: Diagnosis not present

## 2014-02-18 DIAGNOSIS — L0231 Cutaneous abscess of buttock: Secondary | ICD-10-CM | POA: Diagnosis not present

## 2014-02-18 DIAGNOSIS — N390 Urinary tract infection, site not specified: Secondary | ICD-10-CM | POA: Diagnosis not present

## 2014-03-01 DIAGNOSIS — I10 Essential (primary) hypertension: Secondary | ICD-10-CM | POA: Diagnosis not present

## 2014-03-01 DIAGNOSIS — E782 Mixed hyperlipidemia: Secondary | ICD-10-CM | POA: Diagnosis not present

## 2014-03-01 DIAGNOSIS — E119 Type 2 diabetes mellitus without complications: Secondary | ICD-10-CM | POA: Diagnosis not present

## 2014-03-01 DIAGNOSIS — J449 Chronic obstructive pulmonary disease, unspecified: Secondary | ICD-10-CM | POA: Diagnosis not present

## 2014-05-02 DIAGNOSIS — J449 Chronic obstructive pulmonary disease, unspecified: Secondary | ICD-10-CM | POA: Diagnosis not present

## 2014-05-03 DIAGNOSIS — F064 Anxiety disorder due to known physiological condition: Secondary | ICD-10-CM | POA: Diagnosis not present

## 2014-06-30 DIAGNOSIS — I10 Essential (primary) hypertension: Secondary | ICD-10-CM | POA: Diagnosis not present

## 2014-06-30 DIAGNOSIS — Z125 Encounter for screening for malignant neoplasm of prostate: Secondary | ICD-10-CM | POA: Diagnosis not present

## 2014-06-30 DIAGNOSIS — J449 Chronic obstructive pulmonary disease, unspecified: Secondary | ICD-10-CM | POA: Diagnosis not present

## 2014-06-30 DIAGNOSIS — Z0000 Encounter for general adult medical examination without abnormal findings: Secondary | ICD-10-CM | POA: Diagnosis not present

## 2014-06-30 DIAGNOSIS — E119 Type 2 diabetes mellitus without complications: Secondary | ICD-10-CM | POA: Diagnosis not present

## 2014-07-25 DIAGNOSIS — F064 Anxiety disorder due to known physiological condition: Secondary | ICD-10-CM | POA: Diagnosis not present

## 2014-08-08 DIAGNOSIS — Z794 Long term (current) use of insulin: Secondary | ICD-10-CM | POA: Diagnosis not present

## 2014-08-08 DIAGNOSIS — Z713 Dietary counseling and surveillance: Secondary | ICD-10-CM | POA: Diagnosis present

## 2014-08-08 DIAGNOSIS — Z79899 Other long term (current) drug therapy: Secondary | ICD-10-CM | POA: Diagnosis not present

## 2014-08-08 DIAGNOSIS — Z888 Allergy status to other drugs, medicaments and biological substances status: Secondary | ICD-10-CM | POA: Diagnosis not present

## 2014-08-08 DIAGNOSIS — Z889 Allergy status to unspecified drugs, medicaments and biological substances status: Secondary | ICD-10-CM | POA: Diagnosis not present

## 2014-08-08 DIAGNOSIS — Z91013 Allergy to seafood: Secondary | ICD-10-CM | POA: Diagnosis not present

## 2014-08-08 DIAGNOSIS — J441 Chronic obstructive pulmonary disease with (acute) exacerbation: Secondary | ICD-10-CM | POA: Diagnosis not present

## 2014-08-08 DIAGNOSIS — G8929 Other chronic pain: Secondary | ICD-10-CM | POA: Diagnosis present

## 2014-08-08 DIAGNOSIS — Z7951 Long term (current) use of inhaled steroids: Secondary | ICD-10-CM | POA: Diagnosis not present

## 2014-08-08 DIAGNOSIS — J9602 Acute respiratory failure with hypercapnia: Secondary | ICD-10-CM | POA: Diagnosis not present

## 2014-08-08 DIAGNOSIS — Z7901 Long term (current) use of anticoagulants: Secondary | ICD-10-CM | POA: Diagnosis not present

## 2014-08-08 DIAGNOSIS — B1920 Unspecified viral hepatitis C without hepatic coma: Secondary | ICD-10-CM | POA: Diagnosis present

## 2014-08-08 DIAGNOSIS — J449 Chronic obstructive pulmonary disease, unspecified: Secondary | ICD-10-CM | POA: Diagnosis not present

## 2014-08-08 DIAGNOSIS — F17201 Nicotine dependence, unspecified, in remission: Secondary | ICD-10-CM | POA: Diagnosis present

## 2014-08-08 DIAGNOSIS — Z91041 Radiographic dye allergy status: Secondary | ICD-10-CM | POA: Diagnosis not present

## 2014-08-08 DIAGNOSIS — F319 Bipolar disorder, unspecified: Secondary | ICD-10-CM | POA: Diagnosis present

## 2014-08-08 DIAGNOSIS — Z8489 Family history of other specified conditions: Secondary | ICD-10-CM | POA: Diagnosis not present

## 2014-08-08 DIAGNOSIS — R0602 Shortness of breath: Secondary | ICD-10-CM | POA: Diagnosis not present

## 2014-08-08 DIAGNOSIS — I10 Essential (primary) hypertension: Secondary | ICD-10-CM | POA: Diagnosis present

## 2014-08-08 DIAGNOSIS — N400 Enlarged prostate without lower urinary tract symptoms: Secondary | ICD-10-CM | POA: Diagnosis present

## 2014-08-08 DIAGNOSIS — Z681 Body mass index (BMI) 19 or less, adult: Secondary | ICD-10-CM | POA: Diagnosis not present

## 2014-08-08 DIAGNOSIS — K449 Diaphragmatic hernia without obstruction or gangrene: Secondary | ICD-10-CM | POA: Diagnosis present

## 2014-08-08 DIAGNOSIS — E119 Type 2 diabetes mellitus without complications: Secondary | ICD-10-CM | POA: Diagnosis present

## 2014-08-08 DIAGNOSIS — E785 Hyperlipidemia, unspecified: Secondary | ICD-10-CM | POA: Diagnosis present

## 2014-08-08 DIAGNOSIS — M4854XA Collapsed vertebra, not elsewhere classified, thoracic region, initial encounter for fracture: Secondary | ICD-10-CM | POA: Diagnosis not present

## 2014-08-08 DIAGNOSIS — E43 Unspecified severe protein-calorie malnutrition: Secondary | ICD-10-CM | POA: Diagnosis not present

## 2014-08-08 DIAGNOSIS — Z8249 Family history of ischemic heart disease and other diseases of the circulatory system: Secondary | ICD-10-CM | POA: Diagnosis not present

## 2014-08-19 DIAGNOSIS — J449 Chronic obstructive pulmonary disease, unspecified: Secondary | ICD-10-CM | POA: Diagnosis not present

## 2014-09-13 DIAGNOSIS — H524 Presbyopia: Secondary | ICD-10-CM | POA: Diagnosis not present

## 2014-09-13 DIAGNOSIS — E119 Type 2 diabetes mellitus without complications: Secondary | ICD-10-CM | POA: Diagnosis not present

## 2014-09-13 DIAGNOSIS — H5213 Myopia, bilateral: Secondary | ICD-10-CM | POA: Diagnosis not present

## 2014-09-13 DIAGNOSIS — H52223 Regular astigmatism, bilateral: Secondary | ICD-10-CM | POA: Diagnosis not present

## 2014-09-29 DIAGNOSIS — J449 Chronic obstructive pulmonary disease, unspecified: Secondary | ICD-10-CM | POA: Diagnosis not present

## 2014-09-29 DIAGNOSIS — E1143 Type 2 diabetes mellitus with diabetic autonomic (poly)neuropathy: Secondary | ICD-10-CM | POA: Diagnosis not present

## 2014-10-17 DIAGNOSIS — F064 Anxiety disorder due to known physiological condition: Secondary | ICD-10-CM | POA: Diagnosis not present

## 2014-11-29 DIAGNOSIS — S43421A Sprain of right rotator cuff capsule, initial encounter: Secondary | ICD-10-CM | POA: Diagnosis not present

## 2014-11-29 DIAGNOSIS — J449 Chronic obstructive pulmonary disease, unspecified: Secondary | ICD-10-CM | POA: Diagnosis not present

## 2014-11-29 DIAGNOSIS — E1143 Type 2 diabetes mellitus with diabetic autonomic (poly)neuropathy: Secondary | ICD-10-CM | POA: Diagnosis not present

## 2014-12-05 DIAGNOSIS — E119 Type 2 diabetes mellitus without complications: Secondary | ICD-10-CM | POA: Diagnosis not present

## 2014-12-05 DIAGNOSIS — H25812 Combined forms of age-related cataract, left eye: Secondary | ICD-10-CM | POA: Diagnosis not present

## 2014-12-05 DIAGNOSIS — H25813 Combined forms of age-related cataract, bilateral: Secondary | ICD-10-CM | POA: Diagnosis not present

## 2014-12-14 NOTE — Patient Instructions (Addendum)
Kashawn Dirr Poffenberger  12/14/2014     @PREFPERIOPPHARMACY @   Your procedure is scheduled on 12/19/2014  Report to [facility] at 8:30 A.M.  Call this number if you have problems the morning of surgery:  [phone]   Remember:  Do not eat food or drink liquids after midnight.  Take these medicines the morning of surgery with A SIP OF WATER; lorapepam,valsartan,carvedilol,tramdol,abilify, isosorbide, prilosec   Do not wear jewelry, make-up or nail polish.  Do not wear lotions, powders, or perfumes.  You may wear deodorant.  Do not shave 48 hours prior to surgery.  Men may shave face and neck.  Do not bring valuables to the hospital.  [facility] is not responsible for any belongings or valuables.  Contacts, dentures or bridgework may not be worn into surgery.  Leave your suitcase in the car.  After surgery it may be brought to your room.  For patients admitted to the hospital, discharge time will be determined by your treatment team.  Patients discharged the day of surgery will not be allowed to drive home.    Please read over the following fact sheets that you were given. Anesthesia Post-op Instructions     PATIENT INSTRUCTIONS POST-ANESTHESIA  IMMEDIATELY FOLLOWING SURGERY:  Do not drive or operate machinery for the first twenty four hours after surgery.  Do not make any important decisions for twenty four hours after surgery or while taking narcotic pain medications or sedatives.  If you develop intractable nausea and vomiting or a severe headache please notify your doctor immediately.  FOLLOW-UP:  Please make an appointment with your surgeon as instructed. You do not need to follow up with anesthesia unless specifically instructed to do so.  WOUND CARE INSTRUCTIONS (if applicable):  Keep a dry clean dressing on the anesthesia/puncture wound site if there is drainage.  Once the wound has quit draining you may leave it open to air.  Generally you should leave the bandage intact  for twenty four hours unless there is drainage.  If the epidural site drains for more than 36-48 hours please call the anesthesia department.  QUESTIONS?:  Please feel free to call your physician or the hospital operator if you have any questions, and they will be happy to assist you.      Cataract Surgery  A cataract is a clouding of the lens of the eye. When a lens becomes cloudy, vision is reduced based on the degree and nature of the clouding. Surgery may be needed to improve vision. Surgery removes the cloudy lens and usually replaces it with a substitute lens (intraocular lens, IOL). LET YOUR EYE DOCTOR KNOW ABOUT:  Allergies to food or medicine.  Medicines taken including herbs, eye drops, over-the-counter medicines, and creams.  Use of steroids (by mouth or creams).  Previous problems with anesthetics or numbing medicine.  History of bleeding problems or blood clots.  Previous surgery.  Other health problems, including diabetes and kidney problems.  Possibility of pregnancy, if this applies. RISKS AND COMPLICATIONS  Infection.  Inflammation of the eyeball (endophthalmitis) that can spread to both eyes (sympathetic ophthalmia).  Poor wound healing.  If an IOL is inserted, it can later fall out of proper position. This is very uncommon.  Clouding of the part of your eye that holds an IOL in place. This is called an "after-cataract." These are uncommon but easily treated. BEFORE THE PROCEDURE  Do not eat or drink anything except small amounts of water for 8 to 12  before your surgery, or as directed by your caregiver.  Unless you are told otherwise, continue any eye drops you have been prescribed.  Talk to your primary caregiver about all other medicines that you take (both prescription and nonprescription). In some cases, you may need to stop or change medicines near the time of your surgery. This is most important if you are taking blood-thinning medicine.Do not stop  medicines unless you are told to do so.  Arrange for someone to drive you to and from the procedure.  Do not put contact lenses in either eye on the day of your surgery. PROCEDURE There is more than one method for safely removing a cataract. Your doctor can explain the differences and help determine which is best for you. Phacoemulsification surgery is the most common form of cataract surgery.  An injection is given behind the eye or eye drops are given to make this a painless procedure.  A small cut (incision) is made on the edge of the clear, dome-shaped surface that covers the front of the eye (cornea).  A tiny probe is painlessly inserted into the eye. This device gives off ultrasound waves that soften and break up the cloudy center of the lens. This makes it easier for the cloudy lens to be removed by suction.  An IOL may be implanted.  The normal lens of the eye is covered by a clear capsule. Part of that capsule is intentionally left in the eye to support the IOL.  Your surgeon may or may not use stitches to close the incision. There are other forms of cataract surgery that require a larger incision and stitches to close the eye. This approach is taken in cases where the doctor feels that the cataract cannot be easily removed using phacoemulsification. AFTER THE PROCEDURE  When an IOL is implanted, it does not need care. It becomes a permanent part of your eye and cannot be seen or felt.  Your doctor will schedule follow-up exams to check on your progress.  Review your other medicines with your doctor to see which can be resumed after surgery.  Use eye drops or take medicine as prescribed by your doctor. Document Released: 03/14/2011 Document Revised: 08/09/2013 Document Reviewed: 03/14/2011 [facility] Patient Information 2015 [place], [place]. This information is not intended to replace advice given to you by your health care provider. Make sure you discuss any questions you have  with your health care provider.

## 2014-12-15 ENCOUNTER — Other Ambulatory Visit (HOSPITAL_COMMUNITY): Payer: Medicare Other

## 2014-12-15 ENCOUNTER — Encounter (HOSPITAL_COMMUNITY)
Admission: RE | Admit: 2014-12-15 | Discharge: 2014-12-15 | Disposition: A | Discharge status: Home / Self Care | Payer: Medicare Other | Source: Ambulatory Visit | Attending: Ophthalmology | Admitting: Ophthalmology

## 2014-12-15 ENCOUNTER — Encounter (HOSPITAL_COMMUNITY): Payer: Self-pay

## 2014-12-15 DIAGNOSIS — H269 Unspecified cataract: Secondary | ICD-10-CM | POA: Insufficient documentation

## 2014-12-15 DIAGNOSIS — Z01818 Encounter for other preprocedural examination: Secondary | ICD-10-CM | POA: Insufficient documentation

## 2014-12-15 DIAGNOSIS — Z01812 Encounter for preprocedural laboratory examination: Secondary | ICD-10-CM | POA: Diagnosis present

## 2014-12-15 HISTORY — DX: Malignant (primary) neoplasm, unspecified: C80.1

## 2014-12-15 HISTORY — DX: Essential (primary) hypertension: I10

## 2014-12-15 HISTORY — DX: Unspecified rotator cuff tear or rupture of unspecified shoulder, not specified as traumatic: M75.100

## 2014-12-15 HISTORY — DX: Unspecified hearing loss, unspecified ear: H91.90

## 2014-12-15 HISTORY — DX: Type 2 diabetes mellitus without complications: E11.9

## 2014-12-15 HISTORY — DX: Gastro-esophageal reflux disease without esophagitis: K21.9

## 2014-12-15 HISTORY — DX: Unspecified viral hepatitis C without hepatic coma: B19.20

## 2014-12-15 HISTORY — DX: Chronic obstructive pulmonary disease, unspecified: J44.9

## 2014-12-15 HISTORY — DX: Unspecified asthma, uncomplicated: J45.909

## 2014-12-15 HISTORY — DX: Unspecified osteoarthritis, unspecified site: M19.90

## 2014-12-15 HISTORY — DX: Major depressive disorder, single episode, unspecified: F32.9

## 2014-12-15 HISTORY — DX: Personal history of other infectious and parasitic diseases: Z86.19

## 2014-12-15 HISTORY — DX: Depression, unspecified: F32.A

## 2014-12-15 HISTORY — DX: Personal history of pulmonary embolism: Z86.711

## 2014-12-15 HISTORY — DX: Anxiety disorder, unspecified: F41.9

## 2014-12-15 HISTORY — DX: Bipolar disorder, unspecified: F31.9

## 2014-12-15 HISTORY — DX: Tremor, unspecified: R25.1

## 2014-12-15 LAB — BASIC METABOLIC PANEL WITH GFR
Anion gap: 7 (ref 5–15)
BUN: 29 mg/dL — ABNORMAL HIGH (ref 6–20)
CO2: 27 mmol/L (ref 22–32)
Calcium: 9.2 mg/dL (ref 8.9–10.3)
Chloride: 104 mmol/L (ref 101–111)
Creatinine, Ser: 1.32 mg/dL — ABNORMAL HIGH (ref 0.61–1.24)
GFR calc Af Amer: >60 mL/min
GFR calc non Af Amer: 54 mL/min — ABNORMAL LOW
Glucose, Bld: 89 mg/dL (ref 65–99)
Potassium: 4.4 mmol/L (ref 3.5–5.1)
Sodium: 138 mmol/L (ref 135–145)

## 2014-12-15 LAB — CBC
HCT: 31.6 % — ABNORMAL LOW (ref 39.0–52.0)
Hemoglobin: 10.2 g/dL — ABNORMAL LOW (ref 13.0–17.0)
MCH: 29.1 pg (ref 26.0–34.0)
MCHC: 32.3 g/dL (ref 30.0–36.0)
MCV: 90.3 fL (ref 78.0–100.0)
Platelets: 286 10*3/uL (ref 150–400)
RBC: 3.5 MIL/uL — ABNORMAL LOW (ref 4.22–5.81)
RDW: 14.2 % (ref 11.5–15.5)
WBC: 8.9 10*3/uL (ref 4.0–10.5)

## 2014-12-15 NOTE — Pre-Procedure Instructions (Signed)
Coordinator given information to sign up for my chart at home.

## 2014-12-19 ENCOUNTER — Encounter (HOSPITAL_COMMUNITY)
Admission: RE | Disposition: A | Discharge status: Home / Self Care | Payer: Self-pay | Source: Ambulatory Visit | Attending: Ophthalmology

## 2014-12-19 ENCOUNTER — Ambulatory Visit (HOSPITAL_COMMUNITY): Payer: Medicare Other | Admitting: Anesthesiology

## 2014-12-19 ENCOUNTER — Ambulatory Visit (HOSPITAL_COMMUNITY)
Admission: RE | Admit: 2014-12-19 | Discharge: 2014-12-19 | Disposition: A | Discharge status: Home / Self Care | Payer: Medicare Other | Source: Ambulatory Visit | Attending: Ophthalmology | Admitting: Ophthalmology

## 2014-12-19 ENCOUNTER — Encounter (HOSPITAL_COMMUNITY): Payer: Self-pay | Admitting: Anesthesiology

## 2014-12-19 DIAGNOSIS — Z87891 Personal history of nicotine dependence: Secondary | ICD-10-CM | POA: Diagnosis not present

## 2014-12-19 DIAGNOSIS — K219 Gastro-esophageal reflux disease without esophagitis: Secondary | ICD-10-CM | POA: Diagnosis not present

## 2014-12-19 DIAGNOSIS — F418 Other specified anxiety disorders: Secondary | ICD-10-CM | POA: Insufficient documentation

## 2014-12-19 DIAGNOSIS — J449 Chronic obstructive pulmonary disease, unspecified: Secondary | ICD-10-CM | POA: Insufficient documentation

## 2014-12-19 DIAGNOSIS — Z79899 Other long term (current) drug therapy: Secondary | ICD-10-CM | POA: Diagnosis not present

## 2014-12-19 DIAGNOSIS — I10 Essential (primary) hypertension: Secondary | ICD-10-CM | POA: Diagnosis not present

## 2014-12-19 DIAGNOSIS — Z7901 Long term (current) use of anticoagulants: Secondary | ICD-10-CM | POA: Insufficient documentation

## 2014-12-19 DIAGNOSIS — H269 Unspecified cataract: Secondary | ICD-10-CM | POA: Diagnosis not present

## 2014-12-19 DIAGNOSIS — H25812 Combined forms of age-related cataract, left eye: Secondary | ICD-10-CM | POA: Insufficient documentation

## 2014-12-19 DIAGNOSIS — Z86711 Personal history of pulmonary embolism: Secondary | ICD-10-CM | POA: Diagnosis not present

## 2014-12-19 DIAGNOSIS — F319 Bipolar disorder, unspecified: Secondary | ICD-10-CM | POA: Diagnosis not present

## 2014-12-19 DIAGNOSIS — Z9981 Dependence on supplemental oxygen: Secondary | ICD-10-CM | POA: Diagnosis not present

## 2014-12-19 DIAGNOSIS — M1990 Unspecified osteoarthritis, unspecified site: Secondary | ICD-10-CM | POA: Insufficient documentation

## 2014-12-19 HISTORY — PX: CATARACT EXTRACTION W/PHACO: SHX586

## 2014-12-19 LAB — GLUCOSE, CAPILLARY: Glucose-Capillary: 102 mg/dL — ABNORMAL HIGH (ref 65–99)

## 2014-12-19 SURGERY — PHACOEMULSIFICATION, CATARACT, WITH IOL INSERTION
Anesthesia: Monitor Anesthesia Care | Site: Eye | Laterality: Left

## 2014-12-19 MED ORDER — PROVISC 10 MG/ML IO SOLN
INTRAOCULAR | Status: DC | PRN
  Administered 2014-12-19: 0.85 mL via INTRAOCULAR

## 2014-12-19 MED ORDER — NEOMYCIN-POLYMYXIN-DEXAMETH 3.5-10000-0.1 OP SUSP
OPHTHALMIC | Status: DC | PRN
  Administered 2014-12-19: 1 [drp] via OPHTHALMIC

## 2014-12-19 MED ORDER — LIDOCAINE 3.5 % OP GEL OPTIME - NO CHARGE
OPHTHALMIC | Status: DC | PRN
  Administered 2014-12-19: 1 [drp] via OPHTHALMIC

## 2014-12-19 MED ORDER — TETRACAINE HCL 0.5 % OP SOLN
1.0000 [drp] | OPHTHALMIC | Status: AC
  Administered 2014-12-19 (×3): 1 [drp] via OPHTHALMIC

## 2014-12-19 MED ORDER — MIDAZOLAM HCL 2 MG/2ML IJ SOLN
1.0000 mg | INTRAMUSCULAR | Status: DC | PRN
  Administered 2014-12-19 (×2): 1 mg via INTRAVENOUS
  Filled 2014-12-19: qty 2

## 2014-12-19 MED ORDER — LIDOCAINE HCL (PF) 1 % IJ SOLN
INTRAOCULAR | Status: DC | PRN
  Administered 2014-12-19: .9 mL via OPHTHALMIC

## 2014-12-19 MED ORDER — BSS IO SOLN
INTRAOCULAR | Status: DC | PRN
  Administered 2014-12-19: 15 mL

## 2014-12-19 MED ORDER — EPINEPHRINE HCL 1 MG/ML IJ SOLN
INTRAMUSCULAR | Status: AC
  Filled 2014-12-19: qty 1

## 2014-12-19 MED ORDER — FENTANYL CITRATE (PF) 100 MCG/2ML IJ SOLN
25.0000 ug | INTRAMUSCULAR | Status: AC
  Administered 2014-12-19 (×2): 25 ug via INTRAVENOUS
  Filled 2014-12-19: qty 2

## 2014-12-19 MED ORDER — BSS IO SOLN
INTRAOCULAR | Status: DC | PRN
  Administered 2014-12-19: 500 mL

## 2014-12-19 MED ORDER — PHENYLEPHRINE HCL 2.5 % OP SOLN
1.0000 [drp] | OPHTHALMIC | Status: AC
  Administered 2014-12-19 (×3): 1 [drp] via OPHTHALMIC

## 2014-12-19 MED ORDER — EPINEPHRINE HCL 1 MG/ML IJ SOLN
INTRAMUSCULAR | Status: AC
Start: 2014-12-19 — End: 2014-12-19
  Filled 2014-12-19: qty 1

## 2014-12-19 MED ORDER — LIDOCAINE HCL 3.5 % OP GEL
1.0000 "application " | Freq: Once | OPHTHALMIC | Status: AC
  Administered 2014-12-19: 1 via OPHTHALMIC

## 2014-12-19 MED ORDER — LACTATED RINGERS IV SOLN
INTRAVENOUS | Status: DC
  Administered 2014-12-19: 10:00:00 via INTRAVENOUS

## 2014-12-19 MED ORDER — CYCLOPENTOLATE-PHENYLEPHRINE 0.2-1 % OP SOLN
1.0000 [drp] | OPHTHALMIC | Status: AC
  Administered 2014-12-19 (×3): 1 [drp] via OPHTHALMIC

## 2014-12-19 SURGICAL SUPPLY — 11 items
CLOTH BEACON ORANGE TIMEOUT ST (SAFETY) ×3 IMPLANT
EYE SHIELD UNIVERSAL CLEAR (GAUZE/BANDAGES/DRESSINGS) ×3 IMPLANT
GLOVE BIOGEL PI IND STRL 6.5 (GLOVE) ×1 IMPLANT
GLOVE BIOGEL PI INDICATOR 6.5 (GLOVE) ×2
GLOVE ECLIPSE 8.0 STRL XLNG CF (GLOVE) ×3 IMPLANT
PAD ARMBOARD 7.5X6 YLW CONV (MISCELLANEOUS) ×3 IMPLANT
SIGHTPATH CAT PROC W REG LENS (Ophthalmic Related) ×3 IMPLANT
SYRINGE LUER LOK 1CC (MISCELLANEOUS) ×3 IMPLANT
TAPE SURG TRANSPORE 1 IN (GAUZE/BANDAGES/DRESSINGS) ×1 IMPLANT
TAPE SURGICAL TRANSPORE 1 IN (GAUZE/BANDAGES/DRESSINGS) ×2
WATER STERILE IRR 250ML POUR (IV SOLUTION) ×3 IMPLANT

## 2014-12-19 NOTE — H&P (Signed)
I have reviewed the H&P, the patient was re-examined, and I have identified no interval changes in medical condition and plan of care since the history and physical of record  

## 2014-12-19 NOTE — Op Note (Signed)
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  Assistants: None  Anesthesia: Topical with MAC  Indications: Painless, progressive loss of vision with compromise of daily activities.  Surgery: Cataract Extraction with Intraocular lens Implant Left Eye  Discription: The patient had dilating drops and viscous lidocaine placed into the Left eye in the pre-op holding area. After transfer to the operating room, a time out was performed. The patient was then prepped and draped. Beginning with a 45 degree blade a paracentesis port was made at the surgeon's 2 o'clock position. The anterior chamber was then filled with 1% non-preserved lidocaine. This was followed by filling the anterior chamber with Provisc.  A 2.63mm keratome blade was used to create a clear corneal incision at the temporal limbus.  A bent cystatome needle was used to start a continuous tear capsulotomy  Which was completed with Utratta forceps. The lens zonules were noted to be lax. Hydrodissection was performed with balanced salt solution on a Fine canula. The lens nucleus was then removed using the phacoemulsification handpiece. Residual cortex was removed with the I&A handpiece. The capsular bag appeared floppy. The anterior chamber and capsular bag were refilled with Provisc.  Several small lens fragments were seen in the anterior vitreous which would have passed through the zonules. A posterior chamber intraocular lens was placed into the capsular bag with it's injector. The implant was positioned with the Kuglan hook. The Provisc was then removed from the anterior chamber and capsular bag with the I&A handpiece. Stromal hydration of the main incision and paracentesis port was performed with BSS on a Fine canula. The wounds were tested for leak which was negative. The patient tolerated the procedure  well. There were no operative complications. The patient was then transferred to the recovery room in stable condition.  Complications: None  Specimen: None  EBL: None  Prosthetic device: Hoya iSert 250, power 17.0 D, SN [id number].

## 2014-12-19 NOTE — Anesthesia Postprocedure Evaluation (Signed)
  Anesthesia Post-op Note  Patient: Garrett Spencer  Procedure(s) Performed: Procedure(s) with comments: CATARACT EXTRACTION PHACO AND INTRAOCULAR LENS PLACEMENT (IOC) (Left) - CDE: 19.97  Patient Location: Short Stay  Anesthesia Type:MAC  Level of Consciousness: awake, alert , oriented and patient cooperative  Airway and Oxygen Therapy: Patient Spontanous Breathing and Patient connected to nasal cannula oxygen  Post-op Pain: none  Post-op Assessment: Post-op Vital signs reviewed, Patient's Cardiovascular Status Stable, Respiratory Function Stable, Patent Airway, No signs of Nausea or vomiting and Pain level controlled              Post-op Vital Signs: Reviewed and stable  Last Vitals:  Filed Vitals:   12/19/14 1000  BP: 104/71  Resp: 33    Complications: No apparent anesthesia complications

## 2014-12-19 NOTE — Anesthesia Procedure Notes (Signed)
Procedure Name: MAC Date/Time: 12/19/2014 10:03 AM Performed by: Andree Elk, AMY A Pre-anesthesia Checklist: Patient identified, Timeout performed, Emergency Drugs available, Patient being monitored and Suction available

## 2014-12-19 NOTE — Transfer of Care (Signed)
Immediate Anesthesia Transfer of Care Note  Patient: Garrett Spencer  Procedure(s) Performed: Procedure(s) with comments: CATARACT EXTRACTION PHACO AND INTRAOCULAR LENS PLACEMENT (IOC) (Left) - CDE: 19.97  Patient Location: Short Stay  Anesthesia Type:MAC  Level of Consciousness: awake, alert , oriented and patient cooperative  Airway & Oxygen Therapy: Patient Spontanous Breathing and Patient connected to nasal cannula oxygen  Post-op Assessment: Report given to RN and Post -op Vital signs reviewed and stable  Post vital signs: Reviewed and stable  Last Vitals:  Filed Vitals:   12/19/14 1000  BP: 104/71  Resp: 33    Complications: No apparent anesthesia complications

## 2014-12-19 NOTE — Discharge Instructions (Signed)

## 2014-12-19 NOTE — Anesthesia Preprocedure Evaluation (Signed)
Anesthesia Evaluation  Patient identified by MRN, date of birth, ID band Patient awake    Reviewed: Allergy & Precautions, NPO status , Patient's Chart, lab work & pertinent test results  Airway Mallampati: III  TM Distance: >3 FB     Dental  (+) Edentulous Upper, Partial Lower   Pulmonary asthma , COPD,  COPD inhaler and oxygen dependent, former smoker, PE   breath sounds clear to auscultation       Cardiovascular hypertension, Pt. on medications  Rhythm:Regular Rate:Normal     Neuro/Psych PSYCHIATRIC DISORDERS Anxiety Depression Bipolar Disorder    GI/Hepatic GERD  Medicated and Controlled,(+) Hepatitis -, C  Endo/Other  diabetes, Type 2  Renal/GU      Musculoskeletal  (+) Arthritis ,   Abdominal   Peds  Hematology   Anesthesia Other Findings   Reproductive/Obstetrics                             Anesthesia Physical Anesthesia Plan  ASA: IV  Anesthesia Plan: MAC   Post-op Pain Management:    Induction: Intravenous  Airway Management Planned: Nasal Cannula  Additional Equipment:   Intra-op Plan:   Post-operative Plan:   Informed Consent: I have reviewed the patients History and Physical, chart, labs and discussed the procedure including the risks, benefits and alternatives for the proposed anesthesia with the patient or authorized representative who has indicated his/her understanding and acceptance.     Plan Discussed with:   Anesthesia Plan Comments:         Anesthesia Quick Evaluation

## 2014-12-20 ENCOUNTER — Encounter (HOSPITAL_COMMUNITY): Payer: Self-pay | Admitting: Ophthalmology

## 2015-01-02 DIAGNOSIS — H25811 Combined forms of age-related cataract, right eye: Secondary | ICD-10-CM | POA: Diagnosis not present

## 2015-01-02 DIAGNOSIS — Z961 Presence of intraocular lens: Secondary | ICD-10-CM | POA: Diagnosis not present

## 2015-01-10 NOTE — Pre-Procedure Instructions (Signed)
Spoke with Caretaker - Ocie Cornfield, who states that she will be unavailable to care for patient after this surgery, therefore will need to reschedule.  Office notified.

## 2015-01-11 ENCOUNTER — Encounter (HOSPITAL_COMMUNITY)
Admission: RE | Admit: 2015-01-11 | Discharge: 2015-01-11 | Disposition: A | Discharge status: Home / Self Care | Payer: Medicare Other | Source: Ambulatory Visit | Attending: Ophthalmology | Admitting: Ophthalmology

## 2015-02-06 DIAGNOSIS — F064 Anxiety disorder due to known physiological condition: Secondary | ICD-10-CM | POA: Diagnosis not present

## 2015-02-15 ENCOUNTER — Encounter (HOSPITAL_COMMUNITY)
Admission: RE | Admit: 2015-02-15 | Discharge: 2015-02-15 | Disposition: A | Discharge status: Home / Self Care | Payer: Medicare Other | Source: Ambulatory Visit | Attending: Ophthalmology | Admitting: Ophthalmology

## 2015-02-20 ENCOUNTER — Encounter (HOSPITAL_COMMUNITY)
Admission: RE | Disposition: A | Discharge status: Home / Self Care | Payer: Self-pay | Source: Ambulatory Visit | Attending: Ophthalmology

## 2015-02-20 ENCOUNTER — Encounter (HOSPITAL_COMMUNITY): Payer: Self-pay | Admitting: *Deleted

## 2015-02-20 ENCOUNTER — Ambulatory Visit (HOSPITAL_COMMUNITY): Payer: Medicare Other | Admitting: Anesthesiology

## 2015-02-20 ENCOUNTER — Ambulatory Visit (HOSPITAL_COMMUNITY)
Admission: RE | Admit: 2015-02-20 | Discharge: 2015-02-20 | Disposition: A | Discharge status: Home / Self Care | Payer: Medicare Other | Source: Ambulatory Visit | Attending: Ophthalmology | Admitting: Ophthalmology

## 2015-02-20 DIAGNOSIS — Z7984 Long term (current) use of oral hypoglycemic drugs: Secondary | ICD-10-CM | POA: Diagnosis not present

## 2015-02-20 DIAGNOSIS — M1991 Primary osteoarthritis, unspecified site: Secondary | ICD-10-CM | POA: Insufficient documentation

## 2015-02-20 DIAGNOSIS — H25811 Combined forms of age-related cataract, right eye: Secondary | ICD-10-CM | POA: Insufficient documentation

## 2015-02-20 DIAGNOSIS — Z79899 Other long term (current) drug therapy: Secondary | ICD-10-CM | POA: Insufficient documentation

## 2015-02-20 DIAGNOSIS — H269 Unspecified cataract: Secondary | ICD-10-CM | POA: Diagnosis not present

## 2015-02-20 DIAGNOSIS — E119 Type 2 diabetes mellitus without complications: Secondary | ICD-10-CM | POA: Insufficient documentation

## 2015-02-20 DIAGNOSIS — K219 Gastro-esophageal reflux disease without esophagitis: Secondary | ICD-10-CM | POA: Diagnosis not present

## 2015-02-20 DIAGNOSIS — Z86711 Personal history of pulmonary embolism: Secondary | ICD-10-CM | POA: Diagnosis not present

## 2015-02-20 DIAGNOSIS — Z9981 Dependence on supplemental oxygen: Secondary | ICD-10-CM | POA: Diagnosis not present

## 2015-02-20 DIAGNOSIS — I10 Essential (primary) hypertension: Secondary | ICD-10-CM | POA: Insufficient documentation

## 2015-02-20 DIAGNOSIS — J449 Chronic obstructive pulmonary disease, unspecified: Secondary | ICD-10-CM | POA: Diagnosis not present

## 2015-02-20 DIAGNOSIS — F418 Other specified anxiety disorders: Secondary | ICD-10-CM | POA: Diagnosis not present

## 2015-02-20 DIAGNOSIS — Z7901 Long term (current) use of anticoagulants: Secondary | ICD-10-CM | POA: Diagnosis not present

## 2015-02-20 HISTORY — PX: CATARACT EXTRACTION W/PHACO: SHX586

## 2015-02-20 LAB — GLUCOSE, CAPILLARY
GLUCOSE-CAPILLARY: 89 mg/dL (ref 65–99)
Glucose-Capillary: 82 mg/dL (ref 65–99)

## 2015-02-20 SURGERY — PHACOEMULSIFICATION, CATARACT, WITH IOL INSERTION
Anesthesia: Monitor Anesthesia Care | Site: Eye | Laterality: Right

## 2015-02-20 MED ORDER — LACTATED RINGERS IV SOLN
INTRAVENOUS | Status: DC
  Administered 2015-02-20: 12:00:00 via INTRAVENOUS

## 2015-02-20 MED ORDER — LIDOCAINE 3.5 % OP GEL OPTIME - NO CHARGE
OPHTHALMIC | Status: DC | PRN
  Administered 2015-02-20: 2 [drp] via OPHTHALMIC

## 2015-02-20 MED ORDER — BSS IO SOLN
INTRAOCULAR | Status: DC | PRN
  Administered 2015-02-20: 15 mL via INTRAOCULAR

## 2015-02-20 MED ORDER — MIDAZOLAM HCL 2 MG/2ML IJ SOLN
INTRAMUSCULAR | Status: AC
  Filled 2015-02-20: qty 4

## 2015-02-20 MED ORDER — MIDAZOLAM HCL 2 MG/2ML IJ SOLN
INTRAMUSCULAR | Status: AC
  Filled 2015-02-20: qty 2

## 2015-02-20 MED ORDER — CYCLOPENTOLATE-PHENYLEPHRINE 0.2-1 % OP SOLN
1.0000 [drp] | OPHTHALMIC | Status: AC
  Administered 2015-02-20 (×3): 1 [drp] via OPHTHALMIC

## 2015-02-20 MED ORDER — PHENYLEPHRINE HCL 2.5 % OP SOLN
1.0000 [drp] | OPHTHALMIC | Status: AC
  Administered 2015-02-20 (×3): 1 [drp] via OPHTHALMIC

## 2015-02-20 MED ORDER — EPINEPHRINE HCL 1 MG/ML IJ SOLN
INTRAMUSCULAR | Status: AC
  Filled 2015-02-20: qty 1

## 2015-02-20 MED ORDER — PHENYLEPHRINE-KETOROLAC 1-0.3 % IO SOLN
INTRAOCULAR | Status: AC
  Filled 2015-02-20: qty 4

## 2015-02-20 MED ORDER — LIDOCAINE HCL 3.5 % OP GEL: 1.0000 "application " | Freq: Once | OPHTHALMIC | Status: DC

## 2015-02-20 MED ORDER — PROVISC 10 MG/ML IO SOLN
INTRAOCULAR | Status: DC | PRN
  Administered 2015-02-20: 0.85 mL via INTRAOCULAR

## 2015-02-20 MED ORDER — TETRACAINE HCL 0.5 % OP SOLN
1.0000 [drp] | OPHTHALMIC | Status: AC | PRN
Start: 2015-02-20 — End: 2015-02-20
  Administered 2015-02-20 (×3): 1 [drp] via OPHTHALMIC

## 2015-02-20 MED ORDER — POVIDONE-IODINE 5 % OP SOLN: OPHTHALMIC | Status: DC | PRN

## 2015-02-20 MED ORDER — LIDOCAINE HCL (PF) 1 % IJ SOLN
INTRAOCULAR | Status: DC | PRN
  Administered 2015-02-20: 13:00:00 via OPHTHALMIC

## 2015-02-20 MED ORDER — MIDAZOLAM HCL 2 MG/2ML IJ SOLN
1.0000 mg | INTRAMUSCULAR | Status: DC | PRN
  Administered 2015-02-20: 2 mg via INTRAVENOUS

## 2015-02-20 MED ORDER — BSS IO SOLN
INTRAOCULAR | Status: DC | PRN
  Administered 2015-02-20: 500 mL via OPHTHALMIC

## 2015-02-20 MED ORDER — MIDAZOLAM HCL 5 MG/5ML IJ SOLN
INTRAMUSCULAR | Status: DC | PRN
  Administered 2015-02-20: 2 mg via INTRAVENOUS

## 2015-02-20 SURGICAL SUPPLY — 34 items
CAPSULAR TENSION RING-AMO (OPHTHALMIC RELATED) IMPLANT
CLOTH BEACON ORANGE TIMEOUT ST (SAFETY) ×3 IMPLANT
EYE SHIELD UNIVERSAL CLEAR (GAUZE/BANDAGES/DRESSINGS) ×3 IMPLANT
GLOVE BIO SURGEON STRL SZ 6.5 (GLOVE) IMPLANT
GLOVE BIO SURGEONS STRL SZ 6.5 (GLOVE)
GLOVE BIOGEL PI IND STRL 6.5 (GLOVE) IMPLANT
GLOVE BIOGEL PI IND STRL 7.0 (GLOVE) ×3 IMPLANT
GLOVE BIOGEL PI IND STRL 7.5 (GLOVE) IMPLANT
GLOVE BIOGEL PI INDICATOR 6.5 (GLOVE)
GLOVE BIOGEL PI INDICATOR 7.0 (GLOVE) ×6
GLOVE BIOGEL PI INDICATOR 7.5 (GLOVE)
GLOVE ECLIPSE 6.5 STRL STRAW (GLOVE) IMPLANT
GLOVE ECLIPSE 7.0 STRL STRAW (GLOVE) IMPLANT
GLOVE ECLIPSE 7.5 STRL STRAW (GLOVE) IMPLANT
GLOVE EXAM NITRILE LRG STRL (GLOVE) IMPLANT
GLOVE EXAM NITRILE MD LF STRL (GLOVE) IMPLANT
GLOVE SKINSENSE NS SZ6.5 (GLOVE)
GLOVE SKINSENSE NS SZ7.0 (GLOVE)
GLOVE SKINSENSE STRL SZ6.5 (GLOVE) IMPLANT
GLOVE SKINSENSE STRL SZ7.0 (GLOVE) IMPLANT
KIT VITRECTOMY (OPHTHALMIC RELATED) IMPLANT
PAD ARMBOARD 7.5X6 YLW CONV (MISCELLANEOUS) ×3 IMPLANT
PROC W NO LENS (INTRAOCULAR LENS)
PROC W SPEC LENS (INTRAOCULAR LENS)
PROCESS W NO LENS (INTRAOCULAR LENS) IMPLANT
PROCESS W SPEC LENS (INTRAOCULAR LENS) IMPLANT
RETRACTOR IRIS SIGHTPATH (OPHTHALMIC RELATED) IMPLANT
RING MALYGIN (MISCELLANEOUS) IMPLANT
SIGHTPATH CAT PROC W REG LENS (Ophthalmic Related) ×3 IMPLANT
SYRINGE LUER LOK 1CC (MISCELLANEOUS) ×3 IMPLANT
TAPE SURG TRANSPARENT 2IN (GAUZE/BANDAGES/DRESSINGS) ×1 IMPLANT
TAPE TRANSPARENT 2IN (GAUZE/BANDAGES/DRESSINGS) ×2
VISCOELASTIC ADDITIONAL (OPHTHALMIC RELATED) IMPLANT
WATER STERILE IRR 250ML POUR (IV SOLUTION) ×3 IMPLANT

## 2015-02-20 NOTE — H&P (Signed)
I have reviewed the H&P, the patient was re-examined, and I have identified no interval changes in medical condition and plan of care since the history and physical of record  

## 2015-02-20 NOTE — Anesthesia Postprocedure Evaluation (Signed)
  Anesthesia Post-op Note  Patient: Garrett Spencer  Procedure(s) Performed: Procedure(s) with comments: CATARACT EXTRACTION PHACO AND INTRAOCULAR LENS PLACEMENT RIGHT EYE (Right) - 16.59  Patient Location: Short Stay  Anesthesia Type:MAC  Level of Consciousness: awake, alert , oriented and patient cooperative  Airway and Oxygen Therapy: Patient Spontanous Breathing  Post-op Pain: none  Post-op Assessment: Post-op Vital signs reviewed, Patient's Cardiovascular Status Stable, Respiratory Function Stable, Patent Airway, No signs of Nausea or vomiting and Pain level controlled              Post-op Vital Signs: Reviewed and stable  Last Vitals:  Filed Vitals:   02/20/15 1240  BP: 105/77  Pulse:   Temp:   Resp: 35    Complications: No apparent anesthesia complications

## 2015-02-20 NOTE — Op Note (Signed)
Date of Admission: 02/20/2015  Date of Surgery: 02/20/2015   Pre-Op Dx: Cataract Right Eye  Post-Op Dx: Senile Combined Cataract Right  Eye,  Dx Code [id number]  Surgeon: Tonny Branch, M.D.  Assistants: None  Anesthesia: Topical with MAC  Indications: Painless, progressive loss of vision with compromise of daily activities.  Surgery: Cataract Extraction with Intraocular lens Implant Right Eye  Discription: The patient had dilating drops and viscous lidocaine placed into the Right eye in the pre-op holding area. After transfer to the operating room, a time out was performed. The patient takes Flomax so Omidria was added to the infusion bag. The patient was then prepped and draped. Beginning with a 60 degree blade a paracentesis port was made at the surgeon's 2 o'clock position. The anterior chamber was then filled with 1% non-preserved lidocaine with epinepherine. This was followed by filling the anterior chamber with Provisc.  A 2.71mm keratome blade was used to make a clear corneal incision at the temporal limbus.  A bent cystatome needle was used to create a continuous tear capsulotomy. Diffuse zonular weakness was noted. Hydrodissection was performed with balanced salt solution on a Fine canula. The lens nucleus was then removed using the phacoemulsification handpiece. Residual cortex was removed with the I&A handpiece. The anterior chamber and capsular bag were refilled with Provisc. A posterior chamber intraocular lens was placed into the capsular bag with it's injector. The implant was positioned with the Kuglan hook. The Provisc was then removed from the anterior chamber and capsular bag with the I&A handpiece. Stromal hydration of the main incision and paracentesis port was performed with BSS on a Fine canula. The wounds were tested for leak which was negative. The patient tolerated the procedure well. There were no operative complications. The patient was then transferred to the recovery room in  stable condition.  Complications: None  Specimen: None  EBL: None  Prosthetic device: Hoya iSert 250, power 18.0 D, SN [id number].

## 2015-02-20 NOTE — Transfer of Care (Signed)
Immediate Anesthesia Transfer of Care Note  Patient: Garrett Spencer  Procedure(s) Performed: Procedure(s) with comments: CATARACT EXTRACTION PHACO AND INTRAOCULAR LENS PLACEMENT RIGHT EYE (Right) - 16.59  Patient Location: Short Stay  Anesthesia Type:MAC  Level of Consciousness: awake, alert , oriented and patient cooperative  Airway & Oxygen Therapy: Patient Spontanous Breathing  Post-op Assessment: Report given to RN and Post -op Vital signs reviewed and stable  Post vital signs: Reviewed and stable  Last Vitals:  Filed Vitals:   02/20/15 1240  BP: 105/77  Pulse:   Temp:   Resp: 35    Complications: No apparent anesthesia complications

## 2015-02-20 NOTE — Anesthesia Preprocedure Evaluation (Addendum)
Anesthesia Evaluation  Patient identified by MRN, date of birth, ID band Patient awake    Reviewed: Allergy & Precautions, NPO status , Patient's Chart, lab work & pertinent test results, reviewed documented beta blocker date and time   Airway Mallampati: III  TM Distance: >3 FB     Dental  (+) Missing, Poor Dentition   Pulmonary shortness of breath and at rest, asthma , COPD,  COPD inhaler and oxygen dependent, former smoker, PE    + decreased breath sounds      Cardiovascular Exercise Tolerance: Poor hypertension, Pt. on medications Normal cardiovascular exam     Neuro/Psych Anxiety Depression Bipolar Disorder    GI/Hepatic GERD  Medicated and Controlled,(+) Hepatitis -, C  Endo/Other  diabetes, Type 2, Oral Hypoglycemic Agents  Renal/GU      Musculoskeletal  (+) Arthritis , Osteoarthritis,    Abdominal Normal abdominal exam  (+)   Peds  Hematology   Anesthesia Other Findings   Reproductive/Obstetrics                            Anesthesia Physical Anesthesia Plan  ASA: IV  Anesthesia Plan: MAC   Post-op Pain Management:    Induction: Intravenous  Airway Management Planned: Nasal Cannula  Additional Equipment:   Intra-op Plan:   Post-operative Plan:   Informed Consent: I have reviewed the patients History and Physical, chart, labs and discussed the procedure including the risks, benefits and alternatives for the proposed anesthesia with the patient or authorized representative who has indicated his/her understanding and acceptance.   Dental advisory given  Plan Discussed with: CRNA  Anesthesia Plan Comments:         Anesthesia Quick Evaluation

## 2015-02-21 ENCOUNTER — Encounter (HOSPITAL_COMMUNITY): Payer: Self-pay | Admitting: Ophthalmology

## 2015-02-21 MED ORDER — NEOMYCIN-POLYMYXIN-DEXAMETH 3.5-10000-0.1 OP SUSP
OPHTHALMIC | Status: DC | PRN
  Administered 2015-02-21: 2 [drp] via OPHTHALMIC

## 2015-02-27 DIAGNOSIS — J441 Chronic obstructive pulmonary disease with (acute) exacerbation: Secondary | ICD-10-CM | POA: Diagnosis not present

## 2015-02-27 DIAGNOSIS — E1143 Type 2 diabetes mellitus with diabetic autonomic (poly)neuropathy: Secondary | ICD-10-CM | POA: Diagnosis not present

## 2015-02-27 DIAGNOSIS — L84 Corns and callosities: Secondary | ICD-10-CM | POA: Diagnosis not present

## 2015-02-27 DIAGNOSIS — B182 Chronic viral hepatitis C: Secondary | ICD-10-CM | POA: Diagnosis not present

## 2015-05-29 DIAGNOSIS — F064 Anxiety disorder due to known physiological condition: Secondary | ICD-10-CM | POA: Diagnosis not present

## 2015-05-30 DIAGNOSIS — E1143 Type 2 diabetes mellitus with diabetic autonomic (poly)neuropathy: Secondary | ICD-10-CM | POA: Diagnosis not present

## 2015-05-30 DIAGNOSIS — J441 Chronic obstructive pulmonary disease with (acute) exacerbation: Secondary | ICD-10-CM | POA: Diagnosis not present

## 2015-05-30 DIAGNOSIS — Z79899 Other long term (current) drug therapy: Secondary | ICD-10-CM | POA: Diagnosis not present

## 2015-05-30 DIAGNOSIS — B182 Chronic viral hepatitis C: Secondary | ICD-10-CM | POA: Diagnosis not present

## 2015-06-22 DIAGNOSIS — B182 Chronic viral hepatitis C: Secondary | ICD-10-CM | POA: Diagnosis not present

## 2015-07-20 DIAGNOSIS — M47812 Spondylosis without myelopathy or radiculopathy, cervical region: Secondary | ICD-10-CM | POA: Diagnosis not present

## 2015-07-20 DIAGNOSIS — M5030 Other cervical disc degeneration, unspecified cervical region: Secondary | ICD-10-CM | POA: Diagnosis not present

## 2015-07-20 DIAGNOSIS — M791 Myalgia: Secondary | ICD-10-CM | POA: Diagnosis not present

## 2015-07-20 DIAGNOSIS — M9981 Other biomechanical lesions of cervical region: Secondary | ICD-10-CM | POA: Diagnosis not present

## 2015-08-03 DIAGNOSIS — E1143 Type 2 diabetes mellitus with diabetic autonomic (poly)neuropathy: Secondary | ICD-10-CM | POA: Diagnosis not present

## 2015-08-03 DIAGNOSIS — B182 Chronic viral hepatitis C: Secondary | ICD-10-CM | POA: Diagnosis not present

## 2015-08-03 DIAGNOSIS — M542 Cervicalgia: Secondary | ICD-10-CM | POA: Diagnosis not present

## 2015-08-03 DIAGNOSIS — J441 Chronic obstructive pulmonary disease with (acute) exacerbation: Secondary | ICD-10-CM | POA: Diagnosis not present

## 2015-09-15 DIAGNOSIS — Z0000 Encounter for general adult medical examination without abnormal findings: Secondary | ICD-10-CM | POA: Diagnosis not present

## 2015-09-15 DIAGNOSIS — E1143 Type 2 diabetes mellitus with diabetic autonomic (poly)neuropathy: Secondary | ICD-10-CM | POA: Diagnosis not present

## 2015-09-15 DIAGNOSIS — J441 Chronic obstructive pulmonary disease with (acute) exacerbation: Secondary | ICD-10-CM | POA: Diagnosis not present

## 2015-09-15 DIAGNOSIS — Z1389 Encounter for screening for other disorder: Secondary | ICD-10-CM | POA: Diagnosis not present

## 2015-09-18 DIAGNOSIS — F064 Anxiety disorder due to known physiological condition: Secondary | ICD-10-CM | POA: Diagnosis not present

## 2015-09-26 DIAGNOSIS — Z1211 Encounter for screening for malignant neoplasm of colon: Secondary | ICD-10-CM | POA: Diagnosis not present

## 2015-11-13 DIAGNOSIS — J441 Chronic obstructive pulmonary disease with (acute) exacerbation: Secondary | ICD-10-CM | POA: Diagnosis not present

## 2015-11-13 DIAGNOSIS — E1143 Type 2 diabetes mellitus with diabetic autonomic (poly)neuropathy: Secondary | ICD-10-CM | POA: Diagnosis not present

## 2015-11-13 DIAGNOSIS — I2584 Coronary atherosclerosis due to calcified coronary lesion: Secondary | ICD-10-CM | POA: Diagnosis not present

## 2015-11-13 DIAGNOSIS — F3289 Other specified depressive episodes: Secondary | ICD-10-CM | POA: Diagnosis not present

## 2015-12-12 DIAGNOSIS — F064 Anxiety disorder due to known physiological condition: Secondary | ICD-10-CM | POA: Diagnosis not present

## 2016-02-23 DIAGNOSIS — I2584 Coronary atherosclerosis due to calcified coronary lesion: Secondary | ICD-10-CM | POA: Diagnosis not present

## 2016-02-23 DIAGNOSIS — F3289 Other specified depressive episodes: Secondary | ICD-10-CM | POA: Diagnosis not present

## 2016-02-23 DIAGNOSIS — E1143 Type 2 diabetes mellitus with diabetic autonomic (poly)neuropathy: Secondary | ICD-10-CM | POA: Diagnosis not present

## 2016-02-23 DIAGNOSIS — J441 Chronic obstructive pulmonary disease with (acute) exacerbation: Secondary | ICD-10-CM | POA: Diagnosis not present

## 2016-04-04 DIAGNOSIS — F064 Anxiety disorder due to known physiological condition: Secondary | ICD-10-CM | POA: Diagnosis not present

## 2016-05-28 DIAGNOSIS — F3289 Other specified depressive episodes: Secondary | ICD-10-CM | POA: Diagnosis not present

## 2016-05-28 DIAGNOSIS — J441 Chronic obstructive pulmonary disease with (acute) exacerbation: Secondary | ICD-10-CM | POA: Diagnosis not present

## 2016-05-28 DIAGNOSIS — I2584 Coronary atherosclerosis due to calcified coronary lesion: Secondary | ICD-10-CM | POA: Diagnosis not present

## 2016-05-28 DIAGNOSIS — E1143 Type 2 diabetes mellitus with diabetic autonomic (poly)neuropathy: Secondary | ICD-10-CM | POA: Diagnosis not present

## 2016-06-27 DIAGNOSIS — F064 Anxiety disorder due to known physiological condition: Secondary | ICD-10-CM | POA: Diagnosis not present

## 2016-07-25 DIAGNOSIS — F3289 Other specified depressive episodes: Secondary | ICD-10-CM | POA: Diagnosis not present

## 2016-07-25 DIAGNOSIS — J441 Chronic obstructive pulmonary disease with (acute) exacerbation: Secondary | ICD-10-CM | POA: Diagnosis not present

## 2016-07-25 DIAGNOSIS — E1143 Type 2 diabetes mellitus with diabetic autonomic (poly)neuropathy: Secondary | ICD-10-CM | POA: Diagnosis not present

## 2016-07-25 DIAGNOSIS — I2584 Coronary atherosclerosis due to calcified coronary lesion: Secondary | ICD-10-CM | POA: Diagnosis not present

## 2016-08-06 DIAGNOSIS — D649 Anemia, unspecified: Secondary | ICD-10-CM | POA: Diagnosis not present

## 2016-08-06 DIAGNOSIS — D518 Other vitamin B12 deficiency anemias: Secondary | ICD-10-CM | POA: Diagnosis not present

## 2016-08-06 DIAGNOSIS — Z79899 Other long term (current) drug therapy: Secondary | ICD-10-CM | POA: Diagnosis not present

## 2016-08-06 DIAGNOSIS — M6281 Muscle weakness (generalized): Secondary | ICD-10-CM | POA: Diagnosis not present

## 2016-08-06 DIAGNOSIS — E7800 Pure hypercholesterolemia, unspecified: Secondary | ICD-10-CM | POA: Diagnosis not present

## 2016-08-06 DIAGNOSIS — D688 Other specified coagulation defects: Secondary | ICD-10-CM | POA: Diagnosis not present

## 2016-08-06 DIAGNOSIS — E559 Vitamin D deficiency, unspecified: Secondary | ICD-10-CM | POA: Diagnosis not present

## 2016-08-06 DIAGNOSIS — K219 Gastro-esophageal reflux disease without esophagitis: Secondary | ICD-10-CM | POA: Diagnosis not present

## 2016-08-06 DIAGNOSIS — F319 Bipolar disorder, unspecified: Secondary | ICD-10-CM | POA: Diagnosis not present

## 2016-08-06 DIAGNOSIS — E039 Hypothyroidism, unspecified: Secondary | ICD-10-CM | POA: Diagnosis not present

## 2016-08-06 DIAGNOSIS — E119 Type 2 diabetes mellitus without complications: Secondary | ICD-10-CM | POA: Diagnosis not present

## 2016-08-06 DIAGNOSIS — E782 Mixed hyperlipidemia: Secondary | ICD-10-CM | POA: Diagnosis not present

## 2016-08-07 DIAGNOSIS — J441 Chronic obstructive pulmonary disease with (acute) exacerbation: Secondary | ICD-10-CM | POA: Diagnosis not present

## 2016-08-07 DIAGNOSIS — R41841 Cognitive communication deficit: Secondary | ICD-10-CM | POA: Diagnosis not present

## 2016-08-07 DIAGNOSIS — J9601 Acute respiratory failure with hypoxia: Secondary | ICD-10-CM | POA: Diagnosis not present

## 2016-08-09 DIAGNOSIS — R41841 Cognitive communication deficit: Secondary | ICD-10-CM | POA: Diagnosis not present

## 2016-08-09 DIAGNOSIS — J9601 Acute respiratory failure with hypoxia: Secondary | ICD-10-CM | POA: Diagnosis not present

## 2016-08-09 DIAGNOSIS — J441 Chronic obstructive pulmonary disease with (acute) exacerbation: Secondary | ICD-10-CM | POA: Diagnosis not present

## 2016-08-10 DIAGNOSIS — J441 Chronic obstructive pulmonary disease with (acute) exacerbation: Secondary | ICD-10-CM | POA: Diagnosis not present

## 2016-08-10 DIAGNOSIS — R41841 Cognitive communication deficit: Secondary | ICD-10-CM | POA: Diagnosis not present

## 2016-08-10 DIAGNOSIS — J9601 Acute respiratory failure with hypoxia: Secondary | ICD-10-CM | POA: Diagnosis not present

## 2016-08-12 DIAGNOSIS — F331 Major depressive disorder, recurrent, moderate: Secondary | ICD-10-CM | POA: Diagnosis not present

## 2016-08-12 DIAGNOSIS — J9601 Acute respiratory failure with hypoxia: Secondary | ICD-10-CM | POA: Diagnosis not present

## 2016-08-12 DIAGNOSIS — R41841 Cognitive communication deficit: Secondary | ICD-10-CM | POA: Diagnosis not present

## 2016-08-12 DIAGNOSIS — J441 Chronic obstructive pulmonary disease with (acute) exacerbation: Secondary | ICD-10-CM | POA: Diagnosis not present

## 2016-08-12 DIAGNOSIS — F411 Generalized anxiety disorder: Secondary | ICD-10-CM | POA: Diagnosis not present

## 2016-08-13 DIAGNOSIS — J9601 Acute respiratory failure with hypoxia: Secondary | ICD-10-CM | POA: Diagnosis not present

## 2016-08-13 DIAGNOSIS — J441 Chronic obstructive pulmonary disease with (acute) exacerbation: Secondary | ICD-10-CM | POA: Diagnosis not present

## 2016-08-13 DIAGNOSIS — R41841 Cognitive communication deficit: Secondary | ICD-10-CM | POA: Diagnosis not present

## 2016-08-14 DIAGNOSIS — J9601 Acute respiratory failure with hypoxia: Secondary | ICD-10-CM | POA: Diagnosis not present

## 2016-08-14 DIAGNOSIS — J441 Chronic obstructive pulmonary disease with (acute) exacerbation: Secondary | ICD-10-CM | POA: Diagnosis not present

## 2016-08-14 DIAGNOSIS — R41841 Cognitive communication deficit: Secondary | ICD-10-CM | POA: Diagnosis not present

## 2016-08-15 DIAGNOSIS — J9601 Acute respiratory failure with hypoxia: Secondary | ICD-10-CM | POA: Diagnosis not present

## 2016-08-15 DIAGNOSIS — R41841 Cognitive communication deficit: Secondary | ICD-10-CM | POA: Diagnosis not present

## 2016-08-15 DIAGNOSIS — J441 Chronic obstructive pulmonary disease with (acute) exacerbation: Secondary | ICD-10-CM | POA: Diagnosis not present

## 2016-08-16 DIAGNOSIS — R41841 Cognitive communication deficit: Secondary | ICD-10-CM | POA: Diagnosis not present

## 2016-08-16 DIAGNOSIS — J9601 Acute respiratory failure with hypoxia: Secondary | ICD-10-CM | POA: Diagnosis not present

## 2016-08-16 DIAGNOSIS — J441 Chronic obstructive pulmonary disease with (acute) exacerbation: Secondary | ICD-10-CM | POA: Diagnosis not present

## 2016-08-19 DIAGNOSIS — R41841 Cognitive communication deficit: Secondary | ICD-10-CM | POA: Diagnosis not present

## 2016-08-19 DIAGNOSIS — J9601 Acute respiratory failure with hypoxia: Secondary | ICD-10-CM | POA: Diagnosis not present

## 2016-08-19 DIAGNOSIS — J441 Chronic obstructive pulmonary disease with (acute) exacerbation: Secondary | ICD-10-CM | POA: Diagnosis not present

## 2016-08-20 DIAGNOSIS — F319 Bipolar disorder, unspecified: Secondary | ICD-10-CM | POA: Diagnosis not present

## 2016-08-20 DIAGNOSIS — G4700 Insomnia, unspecified: Secondary | ICD-10-CM | POA: Diagnosis not present

## 2016-08-20 DIAGNOSIS — J9601 Acute respiratory failure with hypoxia: Secondary | ICD-10-CM | POA: Diagnosis not present

## 2016-08-20 DIAGNOSIS — F419 Anxiety disorder, unspecified: Secondary | ICD-10-CM | POA: Diagnosis not present

## 2016-08-20 DIAGNOSIS — Z79899 Other long term (current) drug therapy: Secondary | ICD-10-CM | POA: Diagnosis not present

## 2016-08-20 DIAGNOSIS — J441 Chronic obstructive pulmonary disease with (acute) exacerbation: Secondary | ICD-10-CM | POA: Diagnosis not present

## 2016-08-20 DIAGNOSIS — R41841 Cognitive communication deficit: Secondary | ICD-10-CM | POA: Diagnosis not present

## 2016-08-20 DIAGNOSIS — D649 Anemia, unspecified: Secondary | ICD-10-CM | POA: Diagnosis not present

## 2016-08-21 DIAGNOSIS — R41841 Cognitive communication deficit: Secondary | ICD-10-CM | POA: Diagnosis not present

## 2016-08-21 DIAGNOSIS — J9601 Acute respiratory failure with hypoxia: Secondary | ICD-10-CM | POA: Diagnosis not present

## 2016-08-21 DIAGNOSIS — J441 Chronic obstructive pulmonary disease with (acute) exacerbation: Secondary | ICD-10-CM | POA: Diagnosis not present

## 2016-08-22 DIAGNOSIS — R41841 Cognitive communication deficit: Secondary | ICD-10-CM | POA: Diagnosis not present

## 2016-08-22 DIAGNOSIS — J9601 Acute respiratory failure with hypoxia: Secondary | ICD-10-CM | POA: Diagnosis not present

## 2016-08-22 DIAGNOSIS — J441 Chronic obstructive pulmonary disease with (acute) exacerbation: Secondary | ICD-10-CM | POA: Diagnosis not present

## 2016-08-23 DIAGNOSIS — J441 Chronic obstructive pulmonary disease with (acute) exacerbation: Secondary | ICD-10-CM | POA: Diagnosis not present

## 2016-08-23 DIAGNOSIS — R41841 Cognitive communication deficit: Secondary | ICD-10-CM | POA: Diagnosis not present

## 2016-08-23 DIAGNOSIS — J9601 Acute respiratory failure with hypoxia: Secondary | ICD-10-CM | POA: Diagnosis not present

## 2016-08-25 DIAGNOSIS — R41841 Cognitive communication deficit: Secondary | ICD-10-CM | POA: Diagnosis not present

## 2016-08-25 DIAGNOSIS — J441 Chronic obstructive pulmonary disease with (acute) exacerbation: Secondary | ICD-10-CM | POA: Diagnosis not present

## 2016-08-25 DIAGNOSIS — J9601 Acute respiratory failure with hypoxia: Secondary | ICD-10-CM | POA: Diagnosis not present

## 2016-08-26 DIAGNOSIS — R41841 Cognitive communication deficit: Secondary | ICD-10-CM | POA: Diagnosis not present

## 2016-08-26 DIAGNOSIS — J441 Chronic obstructive pulmonary disease with (acute) exacerbation: Secondary | ICD-10-CM | POA: Diagnosis not present

## 2016-08-26 DIAGNOSIS — J9601 Acute respiratory failure with hypoxia: Secondary | ICD-10-CM | POA: Diagnosis not present

## 2016-08-28 ENCOUNTER — Inpatient Hospital Stay (HOSPITAL_COMMUNITY)
Admission: EM | Admit: 2016-08-28 | Discharge: 2016-09-03 | DRG: 190 | Disposition: A | Discharge status: Skilled Nursing Facility | Payer: Medicare Other | Attending: Internal Medicine | Admitting: Internal Medicine

## 2016-08-28 ENCOUNTER — Encounter (HOSPITAL_COMMUNITY): Payer: Self-pay | Admitting: Emergency Medicine

## 2016-08-28 ENCOUNTER — Emergency Department (HOSPITAL_COMMUNITY): Payer: Medicare Other

## 2016-08-28 DIAGNOSIS — I4892 Unspecified atrial flutter: Secondary | ICD-10-CM | POA: Diagnosis not present

## 2016-08-28 DIAGNOSIS — B1920 Unspecified viral hepatitis C without hepatic coma: Secondary | ICD-10-CM | POA: Diagnosis present

## 2016-08-28 DIAGNOSIS — J9601 Acute respiratory failure with hypoxia: Secondary | ICD-10-CM | POA: Diagnosis not present

## 2016-08-28 DIAGNOSIS — I4891 Unspecified atrial fibrillation: Secondary | ICD-10-CM | POA: Diagnosis not present

## 2016-08-28 DIAGNOSIS — M1990 Unspecified osteoarthritis, unspecified site: Secondary | ICD-10-CM | POA: Diagnosis present

## 2016-08-28 DIAGNOSIS — Z9842 Cataract extraction status, left eye: Secondary | ICD-10-CM

## 2016-08-28 DIAGNOSIS — R0789 Other chest pain: Secondary | ICD-10-CM | POA: Diagnosis not present

## 2016-08-28 DIAGNOSIS — G4700 Insomnia, unspecified: Secondary | ICD-10-CM | POA: Diagnosis present

## 2016-08-28 DIAGNOSIS — K219 Gastro-esophageal reflux disease without esophagitis: Secondary | ICD-10-CM | POA: Diagnosis present

## 2016-08-28 DIAGNOSIS — F3289 Other specified depressive episodes: Secondary | ICD-10-CM | POA: Diagnosis not present

## 2016-08-28 DIAGNOSIS — E785 Hyperlipidemia, unspecified: Secondary | ICD-10-CM | POA: Diagnosis present

## 2016-08-28 DIAGNOSIS — I9589 Other hypotension: Secondary | ICD-10-CM | POA: Diagnosis not present

## 2016-08-28 DIAGNOSIS — I10 Essential (primary) hypertension: Secondary | ICD-10-CM | POA: Diagnosis present

## 2016-08-28 DIAGNOSIS — I119 Hypertensive heart disease without heart failure: Secondary | ICD-10-CM | POA: Diagnosis present

## 2016-08-28 DIAGNOSIS — N400 Enlarged prostate without lower urinary tract symptoms: Secondary | ICD-10-CM | POA: Diagnosis present

## 2016-08-28 DIAGNOSIS — N179 Acute kidney failure, unspecified: Secondary | ICD-10-CM | POA: Diagnosis present

## 2016-08-28 DIAGNOSIS — Z87891 Personal history of nicotine dependence: Secondary | ICD-10-CM | POA: Diagnosis not present

## 2016-08-28 DIAGNOSIS — Z86711 Personal history of pulmonary embolism: Secondary | ICD-10-CM

## 2016-08-28 DIAGNOSIS — Z961 Presence of intraocular lens: Secondary | ICD-10-CM | POA: Diagnosis present

## 2016-08-28 DIAGNOSIS — R0603 Acute respiratory distress: Secondary | ICD-10-CM

## 2016-08-28 DIAGNOSIS — Z79899 Other long term (current) drug therapy: Secondary | ICD-10-CM

## 2016-08-28 DIAGNOSIS — R0602 Shortness of breath: Secondary | ICD-10-CM | POA: Diagnosis not present

## 2016-08-28 DIAGNOSIS — K449 Diaphragmatic hernia without obstruction or gangrene: Secondary | ICD-10-CM | POA: Diagnosis not present

## 2016-08-28 DIAGNOSIS — J441 Chronic obstructive pulmonary disease with (acute) exacerbation: Secondary | ICD-10-CM | POA: Diagnosis not present

## 2016-08-28 DIAGNOSIS — R531 Weakness: Secondary | ICD-10-CM | POA: Diagnosis not present

## 2016-08-28 DIAGNOSIS — J189 Pneumonia, unspecified organism: Secondary | ICD-10-CM | POA: Diagnosis not present

## 2016-08-28 DIAGNOSIS — H9190 Unspecified hearing loss, unspecified ear: Secondary | ICD-10-CM | POA: Diagnosis present

## 2016-08-28 DIAGNOSIS — F418 Other specified anxiety disorders: Secondary | ICD-10-CM | POA: Diagnosis not present

## 2016-08-28 DIAGNOSIS — R0902 Hypoxemia: Secondary | ICD-10-CM | POA: Diagnosis not present

## 2016-08-28 DIAGNOSIS — I471 Supraventricular tachycardia: Secondary | ICD-10-CM | POA: Diagnosis not present

## 2016-08-28 DIAGNOSIS — Z7984 Long term (current) use of oral hypoglycemic drugs: Secondary | ICD-10-CM

## 2016-08-28 DIAGNOSIS — J449 Chronic obstructive pulmonary disease, unspecified: Secondary | ICD-10-CM | POA: Diagnosis not present

## 2016-08-28 DIAGNOSIS — K5900 Constipation, unspecified: Secondary | ICD-10-CM | POA: Diagnosis not present

## 2016-08-28 DIAGNOSIS — R404 Transient alteration of awareness: Secondary | ICD-10-CM | POA: Diagnosis not present

## 2016-08-28 DIAGNOSIS — E559 Vitamin D deficiency, unspecified: Secondary | ICD-10-CM | POA: Diagnosis not present

## 2016-08-28 DIAGNOSIS — E119 Type 2 diabetes mellitus without complications: Secondary | ICD-10-CM | POA: Diagnosis present

## 2016-08-28 DIAGNOSIS — I950 Idiopathic hypotension: Secondary | ICD-10-CM | POA: Diagnosis not present

## 2016-08-28 DIAGNOSIS — E1143 Type 2 diabetes mellitus with diabetic autonomic (poly)neuropathy: Secondary | ICD-10-CM | POA: Diagnosis not present

## 2016-08-28 DIAGNOSIS — R41841 Cognitive communication deficit: Secondary | ICD-10-CM | POA: Diagnosis not present

## 2016-08-28 DIAGNOSIS — F419 Anxiety disorder, unspecified: Secondary | ICD-10-CM | POA: Diagnosis present

## 2016-08-28 DIAGNOSIS — G629 Polyneuropathy, unspecified: Secondary | ICD-10-CM | POA: Diagnosis not present

## 2016-08-28 DIAGNOSIS — R251 Tremor, unspecified: Secondary | ICD-10-CM | POA: Diagnosis present

## 2016-08-28 DIAGNOSIS — J9811 Atelectasis: Secondary | ICD-10-CM | POA: Diagnosis not present

## 2016-08-28 DIAGNOSIS — N401 Enlarged prostate with lower urinary tract symptoms: Secondary | ICD-10-CM | POA: Diagnosis not present

## 2016-08-28 DIAGNOSIS — F319 Bipolar disorder, unspecified: Secondary | ICD-10-CM | POA: Diagnosis present

## 2016-08-28 DIAGNOSIS — Z9841 Cataract extraction status, right eye: Secondary | ICD-10-CM

## 2016-08-28 DIAGNOSIS — I959 Hypotension, unspecified: Secondary | ICD-10-CM | POA: Diagnosis not present

## 2016-08-28 DIAGNOSIS — M6281 Muscle weakness (generalized): Secondary | ICD-10-CM | POA: Diagnosis not present

## 2016-08-28 HISTORY — DX: Benign prostatic hyperplasia with lower urinary tract symptoms: N40.1

## 2016-08-28 HISTORY — DX: Diaphragmatic hernia without obstruction or gangrene: K44.9

## 2016-08-28 HISTORY — DX: Weakness: R53.1

## 2016-08-28 HISTORY — DX: Insomnia, unspecified: G47.00

## 2016-08-28 HISTORY — DX: Vitamin D deficiency, unspecified: E55.9

## 2016-08-28 HISTORY — DX: Hypocalcemia: E83.51

## 2016-08-28 HISTORY — DX: Other obstructive and reflux uropathy: N13.8

## 2016-08-28 HISTORY — DX: Cognitive communication deficit: R41.841

## 2016-08-28 HISTORY — DX: Constipation, unspecified: K59.00

## 2016-08-28 HISTORY — DX: Pneumonia, unspecified organism: J18.9

## 2016-08-28 HISTORY — DX: Hyperlipidemia, unspecified: E78.5

## 2016-08-28 HISTORY — DX: Polyneuropathy, unspecified: G62.9

## 2016-08-28 LAB — BASIC METABOLIC PANEL
Anion gap: 8 (ref 5–15)
BUN: 42 mg/dL — AB (ref 6–20)
CALCIUM: 9.4 mg/dL (ref 8.9–10.3)
CHLORIDE: 100 mmol/L — AB (ref 101–111)
CO2: 32 mmol/L (ref 22–32)
CREATININE: 1.7 mg/dL — AB (ref 0.61–1.24)
GFR calc non Af Amer: 39 mL/min — ABNORMAL LOW (ref >60)
GFR, EST AFRICAN AMERICAN: 45 mL/min — AB (ref >60)
GLUCOSE: 113 mg/dL — AB (ref 65–99)
Potassium: 3.9 mmol/L (ref 3.5–5.1)
Sodium: 140 mmol/L (ref 135–145)

## 2016-08-28 LAB — CBC WITH DIFFERENTIAL/PLATELET
BASOS PCT: 0 %
Basophils Absolute: 0 10*3/uL (ref 0.0–0.1)
EOS ABS: 0.1 10*3/uL (ref 0.0–0.7)
Eosinophils Relative: 1 %
HCT: 33.6 % — ABNORMAL LOW (ref 39.0–52.0)
Hemoglobin: 10.5 g/dL — ABNORMAL LOW (ref 13.0–17.0)
LYMPHS PCT: 8 %
Lymphs Abs: 1.1 10*3/uL (ref 0.7–4.0)
MCH: 28.2 pg (ref 26.0–34.0)
MCHC: 31.3 g/dL (ref 30.0–36.0)
MCV: 90.1 fL (ref 78.0–100.0)
MONO ABS: 1.6 10*3/uL — AB (ref 0.1–1.0)
Monocytes Relative: 12 %
NEUTROS PCT: 79 %
Neutro Abs: 10.9 10*3/uL — ABNORMAL HIGH (ref 1.7–7.7)
Platelets: 252 10*3/uL (ref 150–400)
RBC: 3.73 MIL/uL — ABNORMAL LOW (ref 4.22–5.81)
RDW: 15 % (ref 11.5–15.5)
WBC: 13.7 10*3/uL — AB (ref 4.0–10.5)

## 2016-08-28 LAB — PROCALCITONIN: Procalcitonin: 0.17 ng/mL

## 2016-08-28 LAB — LACTIC ACID, PLASMA
Lactic Acid, Venous: 1.3 mmol/L (ref 0.5–1.9)
Lactic Acid, Venous: 1.8 mmol/L (ref 0.5–1.9)

## 2016-08-28 LAB — GLUCOSE, CAPILLARY: Glucose-Capillary: 190 mg/dL — ABNORMAL HIGH (ref 65–99)

## 2016-08-28 LAB — TROPONIN I: TROPONIN I: 0.03 ng/mL — AB (ref <0.03)

## 2016-08-28 LAB — MRSA PCR SCREENING: MRSA BY PCR: NEGATIVE

## 2016-08-28 LAB — BRAIN NATRIURETIC PEPTIDE: B Natriuretic Peptide: 61 pg/mL (ref 0.0–100.0)

## 2016-08-28 MED ORDER — VANCOMYCIN HCL IN DEXTROSE 1-5 GM/200ML-% IV SOLN: 1000.0000 mg | INTRAVENOUS | Status: DC

## 2016-08-28 MED ORDER — CLONAZEPAM 0.5 MG PO TABS
2.0000 mg | ORAL_TABLET | Freq: Two times a day (BID) | ORAL | Status: DC
  Administered 2016-08-28 – 2016-09-03 (×12): 2 mg via ORAL
  Filled 2016-08-28 (×12): qty 4

## 2016-08-28 MED ORDER — ATORVASTATIN CALCIUM 10 MG PO TABS
10.0000 mg | ORAL_TABLET | Freq: Every day | ORAL | Status: DC
Start: 2016-08-29 — End: 2016-09-03
  Administered 2016-08-28 – 2016-09-03 (×7): 10 mg via ORAL
  Filled 2016-08-28 (×7): qty 1

## 2016-08-28 MED ORDER — DEXTROSE 5 % IV SOLN: 1.0000 g | INTRAVENOUS | Status: DC

## 2016-08-28 MED ORDER — IPRATROPIUM BROMIDE 0.02 % IN SOLN
1.0000 mg | Freq: Once | RESPIRATORY_TRACT | Status: AC
  Administered 2016-08-28: 1 mg via RESPIRATORY_TRACT
  Filled 2016-08-28: qty 5

## 2016-08-28 MED ORDER — BUDESONIDE 0.25 MG/2ML IN SUSP
0.2500 mg | Freq: Two times a day (BID) | RESPIRATORY_TRACT | Status: DC
  Administered 2016-08-28 – 2016-08-30 (×4): 0.25 mg via RESPIRATORY_TRACT
  Filled 2016-08-28 (×4): qty 2

## 2016-08-28 MED ORDER — POTASSIUM CHLORIDE IN NACL 20-0.9 MEQ/L-% IV SOLN
INTRAVENOUS | Status: DC
  Administered 2016-08-28 – 2016-08-29 (×2): via INTRAVENOUS

## 2016-08-28 MED ORDER — TRAMADOL HCL 50 MG PO TABS
50.0000 mg | ORAL_TABLET | Freq: Four times a day (QID) | ORAL | Status: DC
  Administered 2016-08-28 – 2016-09-03 (×23): 50 mg via ORAL
  Filled 2016-08-28 (×23): qty 1

## 2016-08-28 MED ORDER — IPRATROPIUM-ALBUTEROL 0.5-2.5 (3) MG/3ML IN SOLN
3.0000 mL | Freq: Four times a day (QID) | RESPIRATORY_TRACT | Status: DC
  Administered 2016-08-28 (×2): 3 mL via RESPIRATORY_TRACT
  Filled 2016-08-28: qty 3

## 2016-08-28 MED ORDER — SODIUM CHLORIDE 0.9 % IV SOLN
INTRAVENOUS | Status: DC
  Administered 2016-08-28: 15:00:00 via INTRAVENOUS

## 2016-08-28 MED ORDER — CALCIUM CARBONATE-VITAMIN D 500-200 MG-UNIT PO TABS
1.0000 | ORAL_TABLET | Freq: Every day | ORAL | Status: DC
  Administered 2016-08-29 – 2016-09-03 (×6): 1 via ORAL
  Filled 2016-08-28 (×9): qty 1

## 2016-08-28 MED ORDER — SODIUM CHLORIDE 0.9 % IV BOLUS (SEPSIS)
500.0000 mL | Freq: Once | INTRAVENOUS | Status: AC
  Administered 2016-08-28: 500 mL via INTRAVENOUS

## 2016-08-28 MED ORDER — POTASSIUM CHLORIDE 2 MEQ/ML IV SOLN
INTRAVENOUS | Status: DC
  Filled 2016-08-28 (×2): qty 1000

## 2016-08-28 MED ORDER — ARIPIPRAZOLE 10 MG PO TABS
5.0000 mg | ORAL_TABLET | Freq: Every day | ORAL | Status: DC
  Administered 2016-08-29 – 2016-09-03 (×6): 5 mg via ORAL
  Filled 2016-08-28 (×9): qty 1

## 2016-08-28 MED ORDER — ENOXAPARIN SODIUM 40 MG/0.4ML ~~LOC~~ SOLN
40.0000 mg | SUBCUTANEOUS | Status: DC
  Administered 2016-08-28 – 2016-09-02 (×6): 40 mg via SUBCUTANEOUS
  Filled 2016-08-28 (×6): qty 0.4

## 2016-08-28 MED ORDER — FERROUS SULFATE 325 (65 FE) MG PO TABS
325.0000 mg | ORAL_TABLET | Freq: Two times a day (BID) | ORAL | Status: DC
  Administered 2016-08-28 – 2016-09-03 (×13): 325 mg via ORAL
  Filled 2016-08-28 (×13): qty 1

## 2016-08-28 MED ORDER — VITAMIN C 500 MG PO TABS
500.0000 mg | ORAL_TABLET | Freq: Two times a day (BID) | ORAL | Status: DC
  Administered 2016-08-28 – 2016-09-03 (×13): 500 mg via ORAL
  Filled 2016-08-28 (×17): qty 1

## 2016-08-28 MED ORDER — ACETAMINOPHEN 325 MG PO TABS: 650.0000 mg | ORAL_TABLET | Freq: Four times a day (QID) | ORAL | Status: DC | PRN

## 2016-08-28 MED ORDER — INSULIN ASPART 100 UNIT/ML ~~LOC~~ SOLN
0.0000 [IU] | Freq: Three times a day (TID) | SUBCUTANEOUS | Status: DC
  Administered 2016-08-28: 2 [IU] via SUBCUTANEOUS
  Administered 2016-08-29 – 2016-08-30 (×6): 1 [IU] via SUBCUTANEOUS
  Administered 2016-08-31: 2 [IU] via SUBCUTANEOUS
  Administered 2016-08-31 – 2016-09-01 (×3): 1 [IU] via SUBCUTANEOUS
  Administered 2016-09-01 (×2): 2 [IU] via SUBCUTANEOUS
  Administered 2016-09-02: 1 [IU] via SUBCUTANEOUS
  Administered 2016-09-02: 2 [IU] via SUBCUTANEOUS
  Administered 2016-09-03: 1 [IU] via SUBCUTANEOUS

## 2016-08-28 MED ORDER — GABAPENTIN 100 MG PO CAPS
100.0000 mg | ORAL_CAPSULE | Freq: Three times a day (TID) | ORAL | Status: DC
  Administered 2016-08-28 – 2016-09-03 (×19): 100 mg via ORAL
  Filled 2016-08-28 (×19): qty 1

## 2016-08-28 MED ORDER — IPRATROPIUM-ALBUTEROL 0.5-2.5 (3) MG/3ML IN SOLN
3.0000 mL | Freq: Four times a day (QID) | RESPIRATORY_TRACT | Status: DC
  Administered 2016-08-29 – 2016-09-03 (×22): 3 mL via RESPIRATORY_TRACT
  Filled 2016-08-28 (×23): qty 3

## 2016-08-28 MED ORDER — ONDANSETRON HCL 4 MG PO TABS: 4.0000 mg | ORAL_TABLET | Freq: Four times a day (QID) | ORAL | Status: DC | PRN

## 2016-08-28 MED ORDER — ONDANSETRON HCL 4 MG/2ML IJ SOLN: 4.0000 mg | Freq: Four times a day (QID) | INTRAMUSCULAR | Status: DC | PRN

## 2016-08-28 MED ORDER — TRAZODONE HCL 50 MG PO TABS
50.0000 mg | ORAL_TABLET | Freq: Every day | ORAL | Status: DC
  Administered 2016-08-28 – 2016-09-02 (×6): 50 mg via ORAL
  Filled 2016-08-28 (×6): qty 1

## 2016-08-28 MED ORDER — LORAZEPAM 1 MG PO TABS
1.0000 mg | ORAL_TABLET | Freq: Two times a day (BID) | ORAL | Status: DC | PRN
Start: 2016-08-28 — End: 2016-08-30
  Administered 2016-08-28 – 2016-08-29 (×3): 1 mg via ORAL
  Filled 2016-08-28 (×4): qty 1

## 2016-08-28 MED ORDER — METHYLPREDNISOLONE SODIUM SUCC 125 MG IJ SOLR
60.0000 mg | Freq: Three times a day (TID) | INTRAMUSCULAR | Status: DC
  Administered 2016-08-28 – 2016-08-29 (×3): 60 mg via INTRAVENOUS
  Filled 2016-08-28 (×3): qty 2

## 2016-08-28 MED ORDER — DEXTROSE 5 % IV SOLN
2.0000 g | Freq: Once | INTRAVENOUS | Status: AC
  Administered 2016-08-28: 2 g via INTRAVENOUS
  Filled 2016-08-28: qty 2

## 2016-08-28 MED ORDER — ACETAMINOPHEN 650 MG RE SUPP: 650.0000 mg | Freq: Four times a day (QID) | RECTAL | Status: DC | PRN

## 2016-08-28 MED ORDER — VANCOMYCIN HCL 10 G IV SOLR
1500.0000 mg | Freq: Once | INTRAVENOUS | Status: AC
  Administered 2016-08-28: 1500 mg via INTRAVENOUS
  Filled 2016-08-28: qty 1500

## 2016-08-28 MED ORDER — PNEUMOCOCCAL VAC POLYVALENT 25 MCG/0.5ML IJ INJ
0.5000 mL | INJECTION | INTRAMUSCULAR | Status: DC
  Filled 2016-08-28: qty 0.5

## 2016-08-28 MED ORDER — FINASTERIDE 5 MG PO TABS
5.0000 mg | ORAL_TABLET | Freq: Every day | ORAL | Status: DC
  Administered 2016-08-29 – 2016-09-03 (×6): 5 mg via ORAL
  Filled 2016-08-28 (×7): qty 1

## 2016-08-28 MED ORDER — BACLOFEN 10 MG PO TABS
5.0000 mg | ORAL_TABLET | Freq: Three times a day (TID) | ORAL | Status: DC
  Administered 2016-08-28 – 2016-09-03 (×19): 5 mg via ORAL
  Filled 2016-08-28 (×24): qty 1

## 2016-08-28 MED ORDER — ALBUTEROL (5 MG/ML) CONTINUOUS INHALATION SOLN
10.0000 mg/h | INHALATION_SOLUTION | Freq: Once | RESPIRATORY_TRACT | Status: AC
  Administered 2016-08-28: 10 mg/h via RESPIRATORY_TRACT
  Filled 2016-08-28: qty 20

## 2016-08-28 MED ORDER — METHYLPREDNISOLONE SODIUM SUCC 125 MG IJ SOLR
125.0000 mg | Freq: Once | INTRAMUSCULAR | Status: AC
  Administered 2016-08-28: 125 mg via INTRAVENOUS
  Filled 2016-08-28: qty 2

## 2016-08-28 NOTE — ED Notes (Signed)
Pt refused in and out cath, Dr Thurnell Garbe notified.

## 2016-08-28 NOTE — ED Notes (Signed)
Not able to void at this time.  Refuses in and out cath.

## 2016-08-28 NOTE — H&P (Signed)
History and Physical  Garrett Spencer KGY:[id number] DOB: 10-30-1945 DOA: 08/28/2016   PCP: Neale Burly, MD   Patient coming from: Home  Chief Complaint: dyspnea and lethargy  HPI:  Garrett Spencer is a 71 y.o. male with medical history of bipolar disorder, COPD, diabetes mellitus, hypertension, hyperlipidemia, remote PE, and tremor presents with shortness of breath, coughing, and wheezing for "couple days". The patient states that he stopped smoking 3 years ago after smoking 1-1/2 packs per day 45 years. He denies any fevers, chills, chest pain, nausea, vomiting, diarrhea, vomiting, dysuria, hematuria, headache, neck pain. He has noted increasing generalized weakness and fatigue for the past couple days as well as a cough with yellow sputum. He denies any hemoptysis. He denies any orthopnea or worsening lower extremity edema. Upon EMS arrival, the patient was noted to be hypotensive with blood pressure of 81/60 with oxygen saturation 89% on room air.  In the emergency department, the patient had soft systolic blood pressures in the upper 80s and low 90s. The patient was placed on nasal cannula 2 L with [place] saturation up to 90%.  The patient was bolused 1 L fluid with his blood pressure improving into the low 90s. The patient was given Solu-Medrol and albuterol and Atrovent with some improvement of his shortness of breath. Chest x-ray showed left basilar atelectasis with right apical scarring opacity. Lactic acid was 1.3. Troponin was 0.03. BMP was essentially unremarkable except for serum creatinine 1.70. WBC was 13.7.   Assessment/Plan: Acute respiratory failure with hypoxia  -Secondary to COPD exacerbation  -Start duo nebs  -Start Pulmicort   Hypotension  -holding coreg, losartan/HCTZ -IVF -lactic acid 1.3 -check procalcitonin -UA/Urine culture -blood cultures x 2 sets -am cortisol -empiric vanc and cefepime pending culture data  AKI -Previous baseline creatinine  1.3 -IV fluids -Repeat am BMP  Diabetes mellitus type 2 -Holding metformin -NovoLog sliding scale        Past Medical History:  Diagnosis Date  . Anxiety   . Arthritis   . Asthma   . Bipolar disorder ([place])   . BPH with obstruction/lower urinary tract symptoms   . Cancer ([place])   . Cognitive communication deficit   . Constipation   . COPD (chronic obstructive pulmonary disease) ([place])   . Depression   . Diabetes mellitus without complication ([place])   . Diaphragmatic hernia   . Generalized weakness   . GERD (gastroesophageal reflux disease)   . Hepatitis C   . History of pulmonary embolism   . History of shingles   . HOH (hard of hearing)   . Hyperlipidemia   . Hypertension   . Hypocalcemia   . Insomnia   . Pneumonia   . Polyneuropathy   . Torn rotator cuff    left  . Tremors of nervous system   . Vitamin D deficiency    Past Surgical History:  Procedure Laterality Date  . CATARACT EXTRACTION W/PHACO Left 12/19/2014   Procedure: CATARACT EXTRACTION PHACO AND INTRAOCULAR LENS PLACEMENT (IOC);  Surgeon: Tonny Branch, MD;  Location: [facility];  Service: Ophthalmology;  Laterality: Left;  CDE: 19.97  . CATARACT EXTRACTION W/PHACO Right 02/20/2015   Procedure: CATARACT EXTRACTION PHACO AND INTRAOCULAR LENS PLACEMENT RIGHT EYE;  Surgeon: Tonny Branch, MD;  Location: [facility];  Service: Ophthalmology;  Laterality: Right;  16.59  . ORIF FEMUR FRACTURE Left   . TONGUE SURGERY     removal of cancer  . TRANSURETHRAL RESECTION OF PROSTATE  Social History:  reports that he quit smoking about 3 years ago. His smoking use included Cigarettes. He has a 67.50 pack-year smoking history. He does not have any smokeless tobacco history on file. He reports that he does not drink alcohol or use drugs.   History reviewed. No pertinent family history.   Allergies  Allergen Reactions  . Benadryl [Diphenhydramine]     unknown  . Haldol [Haloperidol] Other (See Comments)    unknown  .  Shellfish Allergy Other (See Comments)    unknown     Prior to Admission medications   Medication Sig Start Date End Date Taking? Authorizing Provider  albuterol (PROVENTIL) (2.5 MG/3ML) 0.083% nebulizer solution Take 2.5 mg by nebulization 4 (four) times daily.   Yes [provider]  ARIPiprazole (ABILIFY) 5 MG tablet Take 5 mg by mouth daily.   Yes [provider]  atorvastatin (LIPITOR) 10 MG tablet Take 10 mg by mouth daily.   Yes [provider]  baclofen (LIORESAL) 10 MG tablet Take 5 mg by mouth 3 (three) times daily. 08/15/16  Yes [provider]  budesonide-formoterol (SYMBICORT) 160-4.5 MCG/ACT inhaler Inhale 2 puffs into the lungs 2 (two) times daily.   Yes [provider]  calcium-vitamin D (OSCAL WITH D) 500-200 MG-UNIT per tablet Take 1 tablet by mouth daily with breakfast.    Yes [provider]  carvedilol (COREG) 3.125 MG tablet Take 3.125 mg by mouth 2 (two) times daily with a meal.   Yes [provider]  clonazePAM (KLONOPIN) 2 MG tablet Take 2 mg by mouth 2 (two) times daily.   Yes [provider]  ferrous sulfate 325 (65 FE) MG tablet Take 325 mg by mouth 2 (two) times daily with a meal.   Yes [provider]  finasteride (PROSCAR) 5 MG tablet Take 5 mg by mouth daily.   Yes [provider]  gabapentin (NEURONTIN) 100 MG capsule Take 1 capsule by mouth 3 (three) times daily. 08/15/16  Yes [provider]  INCRUSE ELLIPTA 62.5 MCG/INH AEPB Inhale 1 puff into the lungs daily. 07/22/16  Yes [provider]  isosorbide mononitrate (IMDUR) 30 MG 24 hr tablet Take 30 mg by mouth daily.   Yes [provider]  LORazepam (ATIVAN) 1 MG tablet Take 1 mg by mouth every 12 (twelve) hours as needed for anxiety.   Yes [provider]  losartan-hydrochlorothiazide (HYZAAR) 50-12.5 MG tablet Take 1 tablet by mouth daily.   Yes [provider]  metFORMIN  (GLUCOPHAGE) 500 MG tablet Take 500 mg by mouth 2 (two) times daily with a meal.   Yes [provider]  nitroGLYCERIN (NITROSTAT) 0.4 MG SL tablet Place 0.4 mg under the tongue every 5 (five) minutes as needed for chest pain.   Yes [provider]  traMADol (ULTRAM) 50 MG tablet Take 50 mg by mouth 4 (four) times daily.    Yes [provider]  traZODone (DESYREL) 50 MG tablet Take 50 mg by mouth at bedtime.   Yes [provider]  vitamin C (ASCORBIC ACID) 500 MG tablet Take 500 mg by mouth 2 (two) times daily.   Yes [provider]    Review of Systems:  Constitutional:  No weight loss, night sweats, Fevers, chills, fatigue.  Head&Eyes: No headache.  No vision loss.  No eye pain or scotoma ENT:  No Difficulty swallowing,Tooth/dental problems,Sore throat,  No ear ache, post nasal drip,  Cardio-vascular:  No chest pain, Orthopnea, PND,  swelling in lower extremities,  dizziness, palpitations  GI:  No  abdominal pain, nausea, vomiting, diarrhea, loss of appetite, hematochezia, melena, heartburn, indigestion, Resp:  No shortness of breath with exertion or at rest. No cough. No coughing up of blood .No wheezing.No chest wall deformity  Skin:  no rash or lesions.  GU:  no dysuria, change in color of urine, no urgency or frequency. No flank pain.  Musculoskeletal:  No joint pain or swelling. No decreased range of motion. No back pain.  Psych:  No change in mood or affect. No depression or anxiety. Neurologic: No headache, no dysesthesia, no focal weakness, no vision loss. No syncope  Physical Exam: Vitals:   08/28/16 1230 08/28/16 1300 08/28/16 1330 08/28/16 1400  BP: 93/70 (!) 87/75 (!) 88/65 (!) 81/65  Pulse: 99 92 84 80  Resp: (!) 22   (!) 23  Temp:      TempSrc:      SpO2: 97% 97% 97% 98%  Weight:      Height:       General:  A&O x 3, NAD, nontoxic, pleasant/cooperative Head/Eye: No conjunctival hemorrhage, no icterus, [place]/AT, No  nystagmus ENT:  No icterus,  No thrush, good dentition, no pharyngeal exudate Neck:  No masses, no lymphadenpathy, no bruits CV:  RRR, no rub, no gallop, no S3 Lung:  CTAB, good air movement, no wheeze, no rhonchi Abdomen: soft/NT, +BS, nondistended, no peritoneal signs Ext: No cyanosis, No rashes, No petechiae, No lymphangitis, No edema Neuro: CNII-XII intact, strength 4/5 in bilateral upper and lower extremities, no dysmetria  Labs on Admission:  Basic Metabolic Panel:  Recent Labs Lab 08/28/16 1130  NA 140  K 3.9  CL 100*  CO2 32  GLUCOSE 113*  BUN 42*  CREATININE 1.70*  CALCIUM 9.4   Liver Function Tests: No results for input(s): AST, ALT, ALKPHOS, BILITOT, PROT, ALBUMIN in the last 168 hours. No results for input(s): LIPASE, AMYLASE in the last 168 hours. No results for input(s): AMMONIA in the last 168 hours. CBC:  Recent Labs Lab 08/28/16 1130  WBC 13.7*  NEUTROABS 10.9*  HGB 10.5*  HCT 33.6*  MCV 90.1  PLT 252   Coagulation Profile: No results for input(s): INR, PROTIME in the last 168 hours. Cardiac Enzymes:  Recent Labs Lab 08/28/16 1130  TROPONINI 0.03*   BNP: Invalid input(s): POCBNP CBG: No results for input(s): GLUCAP in the last 168 hours. Urine analysis: No results found for: COLORURINE, APPEARANCEUR, LABSPEC, PHURINE, GLUCOSEU, HGBUR, BILIRUBINUR, KETONESUR, PROTEINUR, UROBILINOGEN, NITRITE, LEUKOCYTESUR Sepsis Labs: @LABRCNTIP (procalcitonin:4,lacticidven:4) )No results found for this or any previous visit (from the past 240 hour(s)).   Radiological Exams on Admission: Dg Chest Portable 1 View  Result Date: 08/28/2016 CLINICAL DATA:  Increased congestion and fatigue. EXAM: PORTABLE CHEST 1 VIEW COMPARISON:  Chest x-ray from [facility] had [facility] 08/08/2014. Abdominal CT 01/23/2013. FINDINGS: Right apical predominately linear opacity is stable from prior and likely scarring. There is a 13 mm lucency with neighboring hazy density at the left  base which likely reflects a cyst seen on 2014 abdominal CT. The lungs are hyperinflated. Normal heart size. Chronic aortic tortuosity. IMPRESSION: 1. Mild hazy density at the left base favoring atelectasis. Superimposed ovoid lucency is likely a lung cyst seen on 2014 abdominal CT. Consider lateral view. 2. COPD and stable right apical scar-like opacity. Electronically Signed   By: Monte Fantasia M.D.   On: 08/28/2016 11:49        Time spent:60 minutes Code Status:  FULL Family Communication:  No Family at bedside Disposition Plan: expect 2-3 day hospitalization Consults called: none DVT Prophylaxis: [place] Lovenox  Anessa Charley, DO  [facility] Pager [phone]  If 7PM-7AM, please contact night-coverage [email or web address] Password [facility] 08/28/2016, 2:37 PM

## 2016-08-28 NOTE — ED Notes (Signed)
Pts Sats down when changing from breathing treatment to n/c  From 99% to 88% while pt resting in bed.

## 2016-08-28 NOTE — Progress Notes (Signed)
Pharmacy Antibiotic Note  Garrett Spencer is a 71 y.o. male admitted on 08/28/2016 with sepsis.  Pharmacy has been consulted for Vancomycin and Cefepime dosing.  Plan: Vancomycin 1500mg  loading dose, then 1000mg  IV every 24 hours.  Goal trough 15-20 mcg/mL.  Cefepime 2gm IV loading dose, then 1gm IV q24h F/U cxs and clinical progress Monitor V/S, labs, and levels as indicated  Height: 5\' 11"  (180.3 cm) Weight: 150 lb (68 kg) IBW/kg (Calculated) : 75.3  Temp (24hrs), Avg:98.1 F (36.7 C), Min:97.8 F (36.6 C), Max:98.3 F (36.8 C)   Recent Labs Lab 08/28/16 1130 08/28/16 1144 08/28/16 1423  WBC 13.7*  --   --   CREATININE 1.70*  --   --   LATICACIDVEN  --  1.3 1.8    Estimated Creatinine Clearance: 38.9 mL/min (A) (by C-G formula based on SCr of 1.7 mg/dL (H)).    Allergies  Allergen Reactions  . Benadryl [Diphenhydramine]     unknown  . Haldol [Haloperidol] Other (See Comments)    unknown  . Shellfish Allergy Other (See Comments)    unknown    Antimicrobials this admission: Vancomycin 5/23 >>  Cefepime 5/23 >>  Dose adjustments this admission: N/A  Microbiology results: 5/23 BCx: pending 5/23 UCx: pending   Thank you for allowing pharmacy to be a part of this patient's care.  Isac Sarna, BS [place], [place] Clinical Pharmacist Pager [phone]  08/28/2016 5:14 PM

## 2016-08-28 NOTE — ED Notes (Signed)
RT at bedside administering treatment. Pt to be taken to unit upon treatment completion.

## 2016-08-28 NOTE — ED Provider Notes (Signed)
[place] DEPT Provider Note   CSN: [id number] Arrival date & time: 08/28/16  1109     History   Chief Complaint Chief Complaint  Patient presents with  . Shortness of Breath    HPI Garrett Spencer is a 71 y.o. male.  HPI  Pt was seen at 1125. Per EMS, NH report and pt: c/o gradual onset and worsening of persistent SOB, cough and wheezing for the past several days. Has been associated with generalized weakness/fatigue. NH has been giving pt his usual nebs and pt has been wearing his N/C O2 without improvement. Denies CP/palpitations, no fevers, no abd pain, no N/V/D, no back pain, no focal motor weakness.    Past Medical History:  Diagnosis Date  . Anxiety   . Arthritis   . Asthma   . Bipolar disorder ([place])   . BPH with obstruction/lower urinary tract symptoms   . Cancer ([place])   . Cognitive communication deficit   . Constipation   . COPD (chronic obstructive pulmonary disease) ([place])   . Depression   . Diabetes mellitus without complication ([place])   . Diaphragmatic hernia   . Generalized weakness   . GERD (gastroesophageal reflux disease)   . Hepatitis C   . History of pulmonary embolism   . History of shingles   . HOH (hard of hearing)   . Hyperlipidemia   . Hypertension   . Hypocalcemia   . Insomnia   . Pneumonia   . Polyneuropathy   . Torn rotator cuff    left  . Tremors of nervous system   . Vitamin D deficiency     There are no active problems to display for this patient.   Past Surgical History:  Procedure Laterality Date  . CATARACT EXTRACTION W/PHACO Left 12/19/2014   Procedure: CATARACT EXTRACTION PHACO AND INTRAOCULAR LENS PLACEMENT (IOC);  Surgeon: Tonny Branch, MD;  Location: [facility];  Service: Ophthalmology;  Laterality: Left;  CDE: 19.97  . CATARACT EXTRACTION W/PHACO Right 02/20/2015   Procedure: CATARACT EXTRACTION PHACO AND INTRAOCULAR LENS PLACEMENT RIGHT EYE;  Surgeon: Tonny Branch, MD;  Location: [facility];  Service: Ophthalmology;   Laterality: Right;  16.59  . ORIF FEMUR FRACTURE Left   . TONGUE SURGERY     removal of cancer  . TRANSURETHRAL RESECTION OF PROSTATE         Home Medications    Prior to Admission medications   Medication Sig Start Date End Date Taking? Authorizing Provider  albuterol (PROVENTIL) (2.5 MG/3ML) 0.083% nebulizer solution Take 2.5 mg by nebulization 4 (four) times daily.   Yes [provider]  ARIPiprazole (ABILIFY) 5 MG tablet Take 5 mg by mouth daily.   Yes [provider]  atorvastatin (LIPITOR) 10 MG tablet Take 10 mg by mouth daily.   Yes [provider]  baclofen (LIORESAL) 10 MG tablet Take 5 mg by mouth 3 (three) times daily. 08/15/16  Yes [provider]  budesonide-formoterol (SYMBICORT) 160-4.5 MCG/ACT inhaler Inhale 2 puffs into the lungs 2 (two) times daily.   Yes [provider]  calcium-vitamin D (OSCAL WITH D) 500-200 MG-UNIT per tablet Take 1 tablet by mouth daily with breakfast.    Yes [provider]  carvedilol (COREG) 3.125 MG tablet Take 3.125 mg by mouth 2 (two) times daily with a meal.   Yes [provider]  clonazePAM (KLONOPIN) 2 MG tablet Take 2 mg by mouth 2 (two) times daily.   Yes [provider]  ferrous sulfate 325 (  65 FE) MG tablet Take 325 mg by mouth 2 (two) times daily with a meal.   Yes [provider]  finasteride (PROSCAR) 5 MG tablet Take 5 mg by mouth daily.   Yes [provider]  gabapentin (NEURONTIN) 100 MG capsule Take 1 capsule by mouth 3 (three) times daily. 08/15/16  Yes [provider]  INCRUSE ELLIPTA 62.5 MCG/INH AEPB Inhale 1 puff into the lungs daily. 07/22/16  Yes [provider]  isosorbide mononitrate (IMDUR) 30 MG 24 hr tablet Take 30 mg by mouth daily.   Yes [provider]  LORazepam (ATIVAN) 1 MG tablet Take 1 mg by mouth every 12 (twelve) hours as needed for anxiety.   Yes [provider]    losartan-hydrochlorothiazide (HYZAAR) 50-12.5 MG tablet Take 1 tablet by mouth daily.   Yes [provider]  metFORMIN (GLUCOPHAGE) 500 MG tablet Take 500 mg by mouth 2 (two) times daily with a meal.   Yes [provider]  nitroGLYCERIN (NITROSTAT) 0.4 MG SL tablet Place 0.4 mg under the tongue every 5 (five) minutes as needed for chest pain.   Yes [provider]  traMADol (ULTRAM) 50 MG tablet Take 50 mg by mouth 4 (four) times daily.    Yes [provider]  traZODone (DESYREL) 50 MG tablet Take 50 mg by mouth at bedtime.   Yes [provider]  vitamin C (ASCORBIC ACID) 500 MG tablet Take 500 mg by mouth 2 (two) times daily.   Yes [provider]    Family History History reviewed. No pertinent family history.  Social History Social History  Substance Use Topics  . Smoking status: Former Smoker    Packs/day: 1.50    Years: 45.00    Types: Cigarettes    Quit date: 12/14/2012  . Smokeless tobacco: Not on file  . Alcohol use No     Allergies   Benadryl [diphenhydramine]; Haldol [haloperidol]; and Shellfish allergy   Review of Systems Review of Systems ROS: Statement: All systems negative except as marked or noted in the HPI; Constitutional: Negative for fever and chills. +generalized weakness; ; Eyes: Negative for eye pain, redness and discharge. ; ; ENMT: Negative for ear pain, hoarseness, nasal congestion, sinus pressure and sore throat. ; ; Cardiovascular: Negative for chest pain, palpitations, diaphoresis, and peripheral edema. ; ; Respiratory: +wheezing, SOB, cough. Negative for stridor. ; ; Gastrointestinal: Negative for nausea, vomiting, diarrhea, abdominal pain, blood in stool, hematemesis, jaundice and rectal bleeding. . ; ; Genitourinary: Negative for dysuria, flank pain and hematuria. ; ; Musculoskeletal: Negative for back pain and neck pain. Negative for swelling and trauma.; ; Skin: Negative for pruritus, rash, abrasions,  blisters, bruising and skin lesion.; ; Neuro: Negative for headache, lightheadedness and neck stiffness. Negative for altered level of consciousness, altered mental status, extremity weakness, paresthesias, involuntary movement, seizure and syncope.       Physical Exam Updated Vital Signs BP 93/70   Pulse (!) 175   Temp 97.8 F (36.6 C) (Oral)   Resp (!) 25   Ht 5\' 11"  (1.803 m)   Wt 68 kg (150 lb)   SpO2 91%   BMI 20.92 kg/m    Patient Vitals for the past 24 hrs:  BP Temp Temp src Pulse Resp SpO2 Height Weight  08/28/16 1200 93/70 - - (!) 175 (!) 25 91 % - -  08/28/16 1145 - - - (!) 111 (!) 27 92 % - -  08/28/16 1130 90/70 - - Marland Kitchen)  24 - - -  08/28/16 1126 - - - - - - 5\' 11"  (1.803 m) 68 kg (150 lb)  08/28/16 1125 99/64 97.8 F (36.6 C) Oral (!) 106 (!) 22 99 % - -     Physical Exam 1130: Physical examination:  Nursing notes reviewed; Vital signs and O2 SAT reviewed;  Constitutional: Well developed, Well nourished, Uncomfortable appearing.;; Head:  Normocephalic, atraumatic; Eyes: EOMI, PERRL, No scleral icterus; ENMT: Mouth and pharynx normal, Mucous membranes dry; Neck: Supple, Full range of motion, No lymphadenopathy; Cardiovascular: Tachycardic rate and rhythm, No gallop; Respiratory: Breath sounds coarse & equal bilaterally, insp/exp wheezes bilat. Faint audible wheezing.  Speaking short sentences, sitting upright, tachypneic.; Chest: Nontender, Movement normal; Abdomen: Soft, Nontender, Nondistended, Normal bowel sounds; Genitourinary: No CVA tenderness; Extremities: Pulses normal, No tenderness, No edema, No calf edema or asymmetry.; Neuro: AA&Ox3, Major CN grossly intact.  Speech clear. No gross focal motor or sensory deficits in extremities.; Skin: Color normal, Warm, Dry.   ED Treatments / Results  Labs (all labs ordered are listed, but only abnormal results are displayed)   EKG  EKG Interpretation  Date/Time:  Wednesday Aug 28 2016 11:19:45 EDT Ventricular Rate:   103 PR Interval:    QRS Duration: 71 QT Interval:  349 QTC Calculation: 455 R Axis:   74 Text Interpretation:  Sinus tachycardia Multiple ventricular premature complexes Baseline wander When compared with ECG of 12/15/2014 Rate faster Confirmed by [facility]  MD, Nunzio Cory [phone]) on 08/28/2016 12:09:58 PM       Radiology   Procedures Procedures (including critical care time)  Medications Ordered in ED Medications  albuterol (PROVENTIL,VENTOLIN) solution continuous neb (not administered)  ipratropium (ATROVENT) nebulizer solution 1 mg (not administered)  0.9 %  sodium chloride infusion (not administered)  methylPREDNISolone sodium succinate (SOLU-MEDROL) 125 mg/2 mL injection 125 mg (125 mg Intravenous Given 08/28/16 1146)  sodium chloride 0.9 % bolus 500 mL (500 mLs Intravenous New Bag/Given 08/28/16 1216)     Initial Impression / Assessment and Plan / ED Course  I have reviewed the triage vital signs and the nursing notes.  Pertinent labs & imaging results that were available during my care of the patient were reviewed by me and considered in my medical decision making (see chart for details).  MDM Reviewed: previous chart, nursing note and vitals Reviewed previous: labs and ECG Interpretation: labs, ECG and x-ray Total time providing critical care: 30-74 minutes. This excludes time spent performing separately reportable procedures and services. Consults: admitting MD   CRITICAL CARE Performed by: Alfonzo Feller Total critical care time: 35 minutes Critical care time was exclusive of separately billable procedures and treating other patients. Critical care was necessary to treat or prevent imminent or life-threatening deterioration. Critical care was time spent personally by me on the following activities: development of treatment plan with patient and/or surrogate as well as nursing, discussions with consultants, evaluation of patient's response to treatment, examination  of patient, obtaining history from patient or surrogate, ordering and performing treatments and interventions, ordering and review of laboratory studies, ordering and review of radiographic studies, pulse oximetry and re-evaluation of patient's condition.   Results for orders placed or performed during the hospital encounter of 09/32/67  Basic metabolic panel  Result Value Ref Range   Sodium 140 135 - 145 mmol/L   Potassium 3.9 3.5 - 5.1 mmol/L   Chloride 100 (L) 101 - 111 mmol/L   CO2 32 22 - 32 mmol/L   Glucose, Bld 113 (H) 65 -  99 mg/dL   BUN 42 (H) 6 - 20 mg/dL   Creatinine, Ser 1.70 (H) 0.61 - 1.24 mg/dL   Calcium 9.4 8.9 - 10.3 mg/dL   GFR calc non Af Amer 39 (L) >60 mL/min   GFR calc Af Amer 45 (L) >60 mL/min   Anion gap 8 5 - 15  Brain natriuretic peptide  Result Value Ref Range   B Natriuretic Peptide 61.0 0.0 - 100.0 pg/mL  Troponin I  Result Value Ref Range   Troponin I 0.03 (HH) <0.03 ng/mL  Lactic acid, plasma  Result Value Ref Range   Lactic Acid, Venous 1.3 0.5 - 1.9 mmol/L  CBC with Differential  Result Value Ref Range   WBC 13.7 (H) 4.0 - 10.5 K/uL   RBC 3.73 (L) 4.22 - 5.81 MIL/uL   Hemoglobin 10.5 (L) 13.0 - 17.0 g/dL   HCT 33.6 (L) 39.0 - 52.0 %   MCV 90.1 78.0 - 100.0 fL   MCH 28.2 26.0 - 34.0 pg   MCHC 31.3 30.0 - 36.0 g/dL   RDW 15.0 11.5 - 15.5 %   Platelets 252 150 - 400 K/uL   Neutrophils Relative % 79 %   Neutro Abs 10.9 (H) 1.7 - 7.7 K/uL   Lymphocytes Relative 8 %   Lymphs Abs 1.1 0.7 - 4.0 K/uL   Monocytes Relative 12 %   Monocytes Absolute 1.6 (H) 0.1 - 1.0 K/uL   Eosinophils Relative 1 %   Eosinophils Absolute 0.1 0.0 - 0.7 K/uL   Basophils Relative 0 %   Basophils Absolute 0.0 0.0 - 0.1 K/uL   Dg Chest Portable 1 View Result Date: 08/28/2016 CLINICAL DATA:  Increased congestion and fatigue. EXAM: PORTABLE CHEST 1 VIEW COMPARISON:  Chest [facility] from [facility] had [facility] 08/08/2014. Abdominal CT 01/23/2013. FINDINGS: Right apical  predominately linear opacity is stable from prior and likely scarring. There is a 13 mm lucency with neighboring hazy density at the left base which likely reflects a cyst seen on 2014 abdominal CT. The lungs are hyperinflated. Normal heart size. Chronic aortic tortuosity. IMPRESSION: 1. Mild hazy density at the left base favoring atelectasis. Superimposed ovoid lucency is likely a lung cyst seen on 2014 abdominal CT. Consider lateral view. 2. COPD and stable right apical scar-like opacity. Electronically Signed   By: Monte Fantasia M.D.   On: 08/28/2016 11:49    1345: On arrival: pt sitting upright, tachypneic, tachycardic, Sats 91-92 % on O2 2L N/C, lungs coarse with wheezing. IV solumedrol and hour long neb started. After neb: pt appears more comfortable at rest, less tachypneic, Sats 97 % on O2 2L N/C, lungs continue diminished. Pt's neb treatment changed over to N/C O2, and pt's O2 Sats dropped to 88 % with pt c/o increasing SOB, with increasing HR and RR. EPIC chart reviewed: Pt's baseline SBP 100.  T/C to [facility] Dr. Carles Collet, case discussed, including:  HPI, pertinent PM/SHx, VS/PE, dx testing, ED course and treatment:  Agreeable to admit.      Final Clinical Impressions(s) / ED Diagnoses   Final diagnoses:  None    New Prescriptions New Prescriptions   No medications on file     Francine Graven, DO 08/30/16 2158

## 2016-08-28 NOTE — ED Notes (Signed)
Pt residence notified of admission.

## 2016-08-28 NOTE — ED Triage Notes (Signed)
EMS reports called out in reference to shortness of breath. EMS reports [place] administered x2 nebulizers bringing pt o2 saturation to 93%. Facility states that pt has increased congestion, fatigue and generalized weakness for last several days. Pt reports wears oxygen at all times but reports is not sure how many liters. Pt currently 99% on 2 liters. Pt alert and oriented to self and place at time of arrival.

## 2016-08-28 NOTE — ED Notes (Signed)
Hospitialist at bedside.  

## 2016-08-29 ENCOUNTER — Inpatient Hospital Stay (HOSPITAL_COMMUNITY): Payer: Medicare Other

## 2016-08-29 DIAGNOSIS — I9589 Other hypotension: Secondary | ICD-10-CM

## 2016-08-29 DIAGNOSIS — R0603 Acute respiratory distress: Secondary | ICD-10-CM

## 2016-08-29 LAB — BLOOD GAS, ARTERIAL
Acid-Base Excess: 1.2 mmol/L (ref 0.0–2.0)
BICARBONATE: 25.1 mmol/L (ref 20.0–28.0)
Drawn by: 270161
O2 Content: 2 L/min
O2 Saturation: 92.7 %
PCO2 ART: 50 mmHg — AB (ref 32.0–48.0)
PH ART: 7.342 — AB (ref 7.350–7.450)
PO2 ART: 69.9 mmHg — AB (ref 83.0–108.0)
Patient temperature: 37

## 2016-08-29 LAB — GLUCOSE, CAPILLARY
GLUCOSE-CAPILLARY: 116 mg/dL — AB (ref 65–99)
Glucose-Capillary: 131 mg/dL — ABNORMAL HIGH (ref 65–99)
Glucose-Capillary: 139 mg/dL — ABNORMAL HIGH (ref 65–99)
Glucose-Capillary: 121 mg/dL — ABNORMAL HIGH (ref 65–99)
Glucose-Capillary: 137 mg/dL — ABNORMAL HIGH (ref 65–99)

## 2016-08-29 LAB — BASIC METABOLIC PANEL
Anion gap: 7 (ref 5–15)
BUN: 37 mg/dL — AB (ref 6–20)
CALCIUM: 8.6 mg/dL — AB (ref 8.9–10.3)
CO2: 29 mmol/L (ref 22–32)
Chloride: 104 mmol/L (ref 101–111)
Creatinine, Ser: 1.15 mg/dL (ref 0.61–1.24)
GFR calc Af Amer: >60 mL/min (ref >60)
GLUCOSE: 145 mg/dL — AB (ref 65–99)
POTASSIUM: 4.1 mmol/L (ref 3.5–5.1)
SODIUM: 140 mmol/L (ref 135–145)

## 2016-08-29 LAB — CBC
HEMATOCRIT: 27.9 % — AB (ref 39.0–52.0)
Hemoglobin: 8.7 g/dL — ABNORMAL LOW (ref 13.0–17.0)
MCH: 27.8 pg (ref 26.0–34.0)
MCHC: 31.2 g/dL (ref 30.0–36.0)
MCV: 89.1 fL (ref 78.0–100.0)
PLATELETS: 243 10*3/uL (ref 150–400)
RBC: 3.13 MIL/uL — ABNORMAL LOW (ref 4.22–5.81)
RDW: 15.1 % (ref 11.5–15.5)
WBC: 9 10*3/uL (ref 4.0–10.5)

## 2016-08-29 LAB — TROPONIN I: TROPONIN I: 0.03 ng/mL — AB (ref <0.03)

## 2016-08-29 LAB — HEMOGLOBIN A1C
Hgb A1c MFr Bld: 5.7 % — ABNORMAL HIGH (ref 4.8–5.6)
MEAN PLASMA GLUCOSE: 117 mg/dL

## 2016-08-29 MED ORDER — VANCOMYCIN HCL 500 MG IV SOLR
500.0000 mg | Freq: Two times a day (BID) | INTRAVENOUS | Status: DC
  Administered 2016-08-29: 500 mg via INTRAVENOUS
  Filled 2016-08-29 (×3): qty 500

## 2016-08-29 MED ORDER — ALBUTEROL SULFATE (2.5 MG/3ML) 0.083% IN NEBU
INHALATION_SOLUTION | RESPIRATORY_TRACT | Status: AC
  Filled 2016-08-29: qty 3

## 2016-08-29 MED ORDER — ALBUTEROL SULFATE (2.5 MG/3ML) 0.083% IN NEBU
2.5000 mg | INHALATION_SOLUTION | RESPIRATORY_TRACT | Status: DC | PRN
  Administered 2016-08-29 – 2016-09-01 (×4): 2.5 mg via RESPIRATORY_TRACT
  Filled 2016-08-29 (×3): qty 3

## 2016-08-29 MED ORDER — DEXTROSE 5 % IV SOLN
1.0000 g | Freq: Three times a day (TID) | INTRAVENOUS | Status: AC
  Administered 2016-08-29 – 2016-09-01 (×11): 1 g via INTRAVENOUS
  Filled 2016-08-29 (×14): qty 1

## 2016-08-29 MED ORDER — METHYLPREDNISOLONE SODIUM SUCC 125 MG IJ SOLR
60.0000 mg | Freq: Four times a day (QID) | INTRAMUSCULAR | Status: AC
  Administered 2016-08-29 – 2016-09-01 (×14): 60 mg via INTRAVENOUS
  Filled 2016-08-29 (×13): qty 2

## 2016-08-29 NOTE — Progress Notes (Signed)
PROGRESS NOTE  Garrett Spencer XHB:[id number] DOB: [birth year] DOA: 08/28/2016 PCP: Neale Burly, MD  Brief History:  71 y.o. male with medical history of bipolar disorder, COPD, diabetes mellitus, hypertension, hyperlipidemia, remote PE, and tremor presents with shortness of breath, coughing, and wheezing for "couple days". The patient states that he stopped smoking 3 years ago after smoking 1-1/2 packs per day 45 years. He denies any fevers, chills, chest pain, nausea, vomiting, diarrhea, vomiting, dysuria, hematuria, headache, neck pain. He has noted increasing generalized weakness and fatigue for the past couple days as well as a cough with yellow sputum. He denies any hemoptysis. He denies any orthopnea or worsening lower extremity edema. Upon EMS arrival, the patient was noted to be hypotensive with blood pressure of 81/60 with oxygen saturation 89% on room air.  Blood cultures were obtained, the patient was started on empiric antibiotics. He was fluid resuscitated with improvement of his blood pressure. The patient was placed on bronchodilators and Solu-Medrol with improvement of his respiratory status.  Assessment/Plan: Acute respiratory failure with hypoxia  -Secondary to COPD exacerbation  -Start duo nebs--pt had been refusing--discussed importance of duonebs with pt -Start Pulmicort  -presently stable on 2L  COPD exacerbation -Remains on 2 L nasal cannula -Remains dyspneic with minimal exertion -Continue IV Solu-Medrol -Continue bronchodilators -Continue Pulmicort  Hypotension  -holding coreg, losartan/HCTZ -continue IVF-->improving -lactic acid 1.3-->1.8 -check procalcitonin--0.17 -blood cultures x 2 sets--neg to date -continue empiric cefepime pending culture data; d/c vancomycin  AKI -Previous baseline creatinine 1.3 -admission serum creatinine 1.70 -IV fluids-->improving -Repeat am BMP  Diabetes mellitus type 2 -Holding metformin -NovoLog sliding  scale -A1C--5.7  Atypical chest pain -order EKG -cycle troponins    Disposition Plan:   Home 5/25 or 5/26 if stable Family Communication:   Family at bedside  Consultants:  none  Code Status:  FULL  DVT Prophylaxis:  [place] [address] Lovenox   Procedures: As Listed in Progress Note Above  Antibiotics: vanco 5/23>>> Cefepime 5/23>>>    Subjective: Patient feels that his breathing has somewhat better but still having some shortness of breath this morning. Denies any fevers, chills, headache, neck pain, nausea, vomiting, diarrhea, abdominal pain. Complains of some intermittent chest pain throughout the night with substernal and sharp in nature. There is no radiation to the neck or arms. No diaphoresis or nausea.  Objective: Vitals:   08/28/16 2245 08/29/16 0436 08/29/16 0533 08/29/16 0734  BP: 124/68  115/72   Pulse: 84  71   Resp: 20  18   Temp: 98.7 F (37.1 C)  98.9 F (37.2 C)   TempSrc: Oral  Oral   SpO2: 97% 97% 100% 100%  Weight:      Height:        Intake/Output Summary (Last 24 hours) at 08/29/16 0853 Last data filed at 08/29/16 0557  Gross per 24 hour  Intake           2422.5 ml  Output               50 ml  Net           2372.5 ml   Weight change:  Exam:   General:  Pt is alert, follows commands appropriately, not in acute distress  HEENT: No icterus, No thrush, No neck mass, [place]/AT  Cardiovascular: RRR, S1/S2, no rubs, no gallops  Respiratory:Minimal bibasilar wheezing. Diminished breath sounds bilateral. Bibasilar rales.  Abdomen: Soft/+BS, non tender, non distended,  no guarding  Extremities: No edema, No lymphangitis, No petechiae, No rashes, no synovitis   Data Reviewed: I have personally reviewed following labs and imaging studies Basic Metabolic Panel:  Recent Labs Lab 08/28/16 1130 08/29/16 0625  NA 140 140  K 3.9 4.1  CL 100* 104  CO2 32 29  GLUCOSE 113* 145*  BUN 42* 37*  CREATININE 1.70* 1.15  CALCIUM 9.4 8.6*   Liver Function  Tests: No results for input(s): AST, ALT, ALKPHOS, BILITOT, PROT, ALBUMIN in the last 168 hours. No results for input(s): LIPASE, AMYLASE in the last 168 hours. No results for input(s): AMMONIA in the last 168 hours. Coagulation Profile: No results for input(s): INR, PROTIME in the last 168 hours. CBC:  Recent Labs Lab 08/28/16 1130 08/29/16 0625  WBC 13.7* 9.0  NEUTROABS 10.9*  --   HGB 10.5* 8.7*  HCT 33.6* 27.9*  MCV 90.1 89.1  PLT 252 243   Cardiac Enzymes:  Recent Labs Lab 08/28/16 1130  TROPONINI 0.03*   BNP: Invalid input(s): POCBNP CBG:  Recent Labs Lab 08/28/16 1813 08/28/16 2310 08/29/16 0740  GLUCAP 190* 116* 131*   HbA1C:  Recent Labs  08/28/16 1130  HGBA1C 5.7*   Urine analysis: No results found for: COLORURINE, APPEARANCEUR, LABSPEC, PHURINE, GLUCOSEU, HGBUR, BILIRUBINUR, KETONESUR, PROTEINUR, UROBILINOGEN, NITRITE, LEUKOCYTESUR Sepsis Labs: @LABRCNTIP (procalcitonin:4,lacticidven:4) ) Recent Results (from the past 240 hour(s))  Culture, blood (Routine X 2) w Reflex to ID Panel     Status: None (Preliminary result)   Collection Time: 08/28/16  3:07 PM  Result Value Ref Range Status   Specimen Description RIGHT ANTECUBITAL  Final   Special Requests   Final    BOTTLES DRAWN AEROBIC AND ANAEROBIC Blood Culture adequate volume   Culture NO GROWTH < 24 HOURS  Final   Report Status PENDING  Incomplete  Culture, blood (Routine X 2) w Reflex to ID Panel     Status: None (Preliminary result)   Collection Time: 08/28/16  3:12 PM  Result Value Ref Range Status   Specimen Description BLOOD RIGHT HAND  Final   Special Requests   Final    BOTTLES DRAWN AEROBIC AND ANAEROBIC Blood Culture adequate volume   Culture NO GROWTH < 24 HOURS  Final   Report Status PENDING  Incomplete  MRSA PCR Screening     Status: None   Collection Time: 08/28/16  6:00 PM  Result Value Ref Range Status   MRSA by PCR NEGATIVE NEGATIVE Final    Comment:        The  GeneXpert MRSA Assay (FDA approved for NASAL specimens only), is one component of a comprehensive MRSA colonization surveillance program. It is not intended to diagnose MRSA infection nor to guide or monitor treatment for MRSA infections.      Scheduled Meds: . ARIPiprazole  5 mg Oral Daily  . atorvastatin  10 mg Oral Daily  . baclofen  5 mg Oral TID  . budesonide (PULMICORT) nebulizer solution  0.25 mg Nebulization BID  . calcium-vitamin D  1 tablet Oral Q breakfast  . clonazePAM  2 mg Oral BID  . enoxaparin (LOVENOX) injection  40 mg Subcutaneous Q24H  . ferrous sulfate  325 mg Oral BID WC  . finasteride  5 mg Oral Daily  . gabapentin  100 mg Oral TID  . insulin aspart  0-9 Units Subcutaneous TID WC  . ipratropium-albuterol  3 mL Nebulization Q6H WA  . methylPREDNISolone (SOLU-MEDROL) injection  60 mg Intravenous Q8H  .  pneumococcal 23 valent vaccine  0.5 mL Intramuscular Tomorrow-1000  . traMADol  50 mg Oral QID  . traZODone  50 mg Oral QHS  . vitamin C  500 mg Oral BID   Continuous Infusions: . 0.9 % NaCl with KCl 20 mEq / L 75 mL/hr at 08/28/16 2059  . ceFEPime (MAXIPIME) IV    . vancomycin      Procedures/Studies: Dg Chest Portable 1 View  Result Date: 08/28/2016 CLINICAL DATA:  Increased congestion and fatigue. EXAM: PORTABLE CHEST 1 VIEW COMPARISON:  [facility] 08/08/2014. Abdominal CT 01/23/2013. FINDINGS: Right apical predominately linear opacity is stable from prior and likely scarring. There is a 13 mm lucency with neighboring hazy density at the left base which likely reflects a cyst seen on 2014 abdominal CT. The lungs are hyperinflated. Normal heart size. Chronic aortic tortuosity. IMPRESSION: 1. Mild hazy density at the left base favoring atelectasis. Superimposed ovoid lucency is likely a lung cyst seen on 2014 abdominal CT. Consider lateral view. 2. COPD and stable right apical scar-like opacity. Electronically Signed   By: Monte Fantasia M.D.   On: 08/28/2016 11:49    Racquel Arkin, DO  [facility] Pager [phone]  If 7PM-7AM, please contact night-coverage [email or web address] Password [email or web address] 08/29/2016, 8:53 AM   LOS: 1 day

## 2016-08-29 NOTE — Progress Notes (Addendum)
Called to see patient due to sob  Pt dyspneic, but states breathing is still better than yesterday.  Denies cp, n/v/ f/c  VS: 98.7--111-25--147/93--95% on 2L  CV-RRR Lung--scattered wheezing, diminished breath sounds bilateral, scattered crackles Abd--soft/NT+BS Ext--no CCE  Personally reviewed EKG--sinus, non specific ST-T changes Personally reviewed CXR--basilar atelectasis--no edema or infiltrates Order stat ABG--7.342/50/70/25 on 2L Order alb/atrovent neb--pt had been refusing -increase solumedrol to 60 IV q 6 hours -troponin neg@0944  and 0.03 at 1506  Total time for all visits today 40 min.  >50% spent counseling and coordinating care  DTat

## 2016-08-29 NOTE — Care Management Note (Signed)
Case Management Note  Patient Details  Name: Garrett Spencer MRN: [id number] Date of Birth: 11-07-1945  Subjective/Objective:                  Pt admitted with COPD. He is from [facility]. CSW aware and will follow.   Action/Plan: Anticipate return to SNF at DC. CSW will make arrangements for return to facility. No CM needs.   Expected Discharge Date:     08/31/2016             Expected Discharge Plan:  [place]  In-House Referral:  NA  Discharge planning Services  NA  Post Acute Care Choice:  NA Choice offered to:  NA  Status of Service:  Completed, signed off  Sherald Barge, RN 08/29/2016, 9:35 AM

## 2016-08-29 NOTE — Progress Notes (Signed)
**Note De-Identified  Obfuscation** EKG completed, reported to RN and placed in patient chart

## 2016-08-29 NOTE — Progress Notes (Signed)
ABG results in EPIC.  Dr. Carles Collet notified via text page.

## 2016-08-29 NOTE — Progress Notes (Signed)
Pharmacy Antibiotic Note  Garrett Spencer is a 71 y.o. male admitted on 08/28/2016 with sepsis.  Pharmacy has been consulted for Vancomycin and Cefepime dosing. Renal function improved, adjusted abx  Plan:  Change Vancomycin 500mg  IV every 12 hours.  Goal trough 15-20 mcg/mL. Increase Cefepime 1gm IV q8h F/U cxs and clinical progress Monitor V/S, labs, and levels as indicated  Height: 5\' 11"  (180.3 cm) Weight: 150 lb (68 kg) IBW/kg (Calculated) : 75.3  Temp (24hrs), Avg:98.4 F (36.9 C), Min:97.8 F (36.6 C), Max:98.9 F (37.2 C)   Recent Labs Lab 08/28/16 1130 08/28/16 1144 08/28/16 1423 08/29/16 0625  WBC 13.7*  --   --  9.0  CREATININE 1.70*  --   --  1.15  LATICACIDVEN  --  1.3 1.8  --     Estimated Creatinine Clearance: 57.5 mL/min (by C-G formula based on SCr of 1.15 mg/dL).    Allergies  Allergen Reactions  . Benadryl [Diphenhydramine]     unknown  . Haldol [Haloperidol] Other (See Comments)    unknown  . Shellfish Allergy Other (See Comments)    unknown    Antimicrobials this admission: Vancomycin 5/23 >>  Cefepime 5/23 >>  Dose adjustments this admission: 5/24 Cefepime  And Vancomycin  Microbiology results: 5/23 BCx: ngtd 5/23 MRSA PCR: negative   Thank you for allowing pharmacy to be a part of this patient's care.  Isac Sarna, BS [place], [place] Clinical Pharmacist Pager [phone]  08/29/2016 8:56 AM

## 2016-08-30 ENCOUNTER — Inpatient Hospital Stay (HOSPITAL_COMMUNITY): Payer: Medicare Other

## 2016-08-30 DIAGNOSIS — I471 Supraventricular tachycardia, unspecified: Secondary | ICD-10-CM

## 2016-08-30 LAB — BLOOD GAS, ARTERIAL
ACID-BASE EXCESS: 2.8 mmol/L — AB (ref 0.0–2.0)
Bicarbonate: 26.8 mmol/L (ref 20.0–28.0)
DRAWN BY: 27733
O2 Content: 3 L/min
O2 Saturation: 98.4 %
PCO2 ART: 44.6 mmHg (ref 32.0–48.0)
PH ART: 7.402 (ref 7.350–7.450)
Patient temperature: 37
pO2, Arterial: 114 mmHg — ABNORMAL HIGH (ref 83.0–108.0)

## 2016-08-30 LAB — GLUCOSE, CAPILLARY
GLUCOSE-CAPILLARY: 150 mg/dL — AB (ref 65–99)
GLUCOSE-CAPILLARY: 121 mg/dL — AB (ref 65–99)
GLUCOSE-CAPILLARY: 193 mg/dL — AB (ref 65–99)
Glucose-Capillary: 121 mg/dL — ABNORMAL HIGH (ref 65–99)

## 2016-08-30 LAB — MAGNESIUM: MAGNESIUM: 1.8 mg/dL (ref 1.7–2.4)

## 2016-08-30 LAB — BASIC METABOLIC PANEL
ANION GAP: 6 (ref 5–15)
BUN: 43 mg/dL — AB (ref 6–20)
CALCIUM: 8.9 mg/dL (ref 8.9–10.3)
CO2: 28 mmol/L (ref 22–32)
Chloride: 104 mmol/L (ref 101–111)
Creatinine, Ser: 1.24 mg/dL (ref 0.61–1.24)
GFR calc Af Amer: >60 mL/min (ref >60)
GFR calc non Af Amer: 57 mL/min — ABNORMAL LOW (ref >60)
Glucose, Bld: 137 mg/dL — ABNORMAL HIGH (ref 65–99)
POTASSIUM: 4.3 mmol/L (ref 3.5–5.1)
Sodium: 138 mmol/L (ref 135–145)

## 2016-08-30 LAB — PROCALCITONIN

## 2016-08-30 MED ORDER — DEXTROSE 5 % IV SOLN: 5.0000 mg/h | INTRAVENOUS | Status: DC

## 2016-08-30 MED ORDER — BUDESONIDE 0.5 MG/2ML IN SUSP
0.5000 mg | Freq: Two times a day (BID) | RESPIRATORY_TRACT | Status: DC
  Administered 2016-08-30 – 2016-09-03 (×8): 0.5 mg via RESPIRATORY_TRACT
  Filled 2016-08-30 (×8): qty 2

## 2016-08-30 MED ORDER — DILTIAZEM HCL 30 MG PO TABS
30.0000 mg | ORAL_TABLET | Freq: Four times a day (QID) | ORAL | Status: AC
  Administered 2016-08-30 – 2016-09-01 (×7): 30 mg via ORAL
  Filled 2016-08-30 (×6): qty 1

## 2016-08-30 MED ORDER — LORAZEPAM 1 MG PO TABS
1.0000 mg | ORAL_TABLET | Freq: Three times a day (TID) | ORAL | Status: DC | PRN
  Administered 2016-08-30 – 2016-09-02 (×6): 1 mg via ORAL
  Filled 2016-08-30 (×6): qty 1

## 2016-08-30 MED ORDER — SODIUM CHLORIDE 0.9 % IV SOLN: INTRAVENOUS | Status: DC

## 2016-08-30 MED ORDER — SODIUM CHLORIDE 0.9 % IV SOLN
INTRAVENOUS | Status: AC
  Administered 2016-08-30: 17:00:00 via INTRAVENOUS

## 2016-08-30 MED ORDER — METHYLPREDNISOLONE SODIUM SUCC 125 MG IJ SOLR
60.0000 mg | Freq: Once | INTRAMUSCULAR | Status: AC
  Administered 2016-08-30: 60 mg via INTRAVENOUS
  Filled 2016-08-30: qty 2

## 2016-08-30 MED ORDER — IPRATROPIUM-ALBUTEROL 0.5-2.5 (3) MG/3ML IN SOLN: 3.0000 mL | Freq: Once | RESPIRATORY_TRACT | Status: AC

## 2016-08-30 NOTE — Care Management Important Message (Signed)
Important Message  Patient Details  Name: Garrett Spencer MRN: [id number] Date of Birth: [birth year]   Medicare Important Message Given:  Yes    Sherald Barge, RN 08/30/2016, 11:34 AM

## 2016-08-30 NOTE — Progress Notes (Signed)
PROGRESS NOTE  Garrett Spencer OFB:[id number] DOB: 1946/02/13 DOA: 08/28/2016 PCP: Neale Burly, MD  Brief History:  71 y.o.malewith medical history ofbipolar disorder, COPD, diabetes mellitus, hypertension, hyperlipidemia, remote PE, and tremor presents with shortness of breath, coughing, and wheezing for "couple days". The patient states that he stopped smoking 3 years ago after smoking 1-1/2 packs per day 45 years. He denies any fevers, chills, chest pain, nausea, vomiting, diarrhea, vomiting, dysuria, hematuria, headache, neck pain. He has noted increasing generalized weakness and fatigue for the past couple days as well as a cough with yellow sputum. He denies any hemoptysis. He denies any orthopnea or worsening lower extremity edema. Upon EMS arrival, the patient was noted to be hypotensive with blood pressure of 81/60 with oxygen saturation 89% on room air.  Blood cultures were obtained, the patient was started on empiric antibiotics. He was fluid resuscitated with improvement of his blood pressure. The patient was placed on bronchodilators and Solu-Medrol with improvement of his respiratory status.  Assessment/Plan: Acute respiratory failure with hypoxia  -Secondary to COPD exacerbation  -Start duo nebs--pt had been refusing--discussed importance of duonebs with pt -Start Pulmicort  -presently stable on 2L -The patient developed respiratory distress on 08/29/2016 and 08/30/2016 -Transfer to stepdown unit -08/30/2013--developed respiratory distress and tachycardia up to the 140s -repeat CXR and ABG -give prn alb/atv neb  SVT -EKG with lost artifact--personally reviewed--atrial tachycardia, ?MAT -start diltiazem drip -transfer to stepdown  COPD exacerbation -Remains on 2 L nasal cannula -Remains dyspneic with minimal exertion -Continue IV Solu-Medrol--increase to q 6 hours -Continue bronchodilators -Continue Pulmicort-increase to 0.5 mg  bid  Hypotension-->improved -holding coreg, losartan/HCTZ initially -continue IVF-->improved -lactic acid 1.3-->1.8 -check procalcitonin--0.17 -blood cultures x 2 sets--neg to date -continue empiric cefepime pending culture data; d/c vancomycin  AKI -Previous baseline creatinine 1.3 -admission serum creatinine 1.70 -IV fluids-->improving -Repeat amBMP  Diabetes mellitus type 2 -Holding metformin -NovoLog sliding scale -A1C--5.7  Atypical chest pain -order EKG--no ST-T changes -cycle troponins--unremarkable    Disposition Plan:   transfer to stepdown Family Communication:   Family at bedside  Consultants:  none  Code Status:  FULL  DVT Prophylaxis:  [place] Lovenox   Procedures: As Listed in Progress Note Above  Antibiotics: vanco 5/23>>>5/24 Cefepime 5/23>>>   Subjective: Patient with shortness of breath. Denies any chest pain, nausea, vomiting, diarrhea, abdominal pain. No dysuria or hematuria. Denies any fevers, chills, headache. He still has a nonproductive cough.  Objective: Vitals:   08/30/16 0747 08/30/16 1023 08/30/16 1332 08/30/16 1421  BP:   (!) 154/97   Pulse:   (!) 142   Resp:   (!) 22   Temp:      TempSrc:      SpO2: 92% 100% 96% 98%  Weight:      Height:        Intake/Output Summary (Last 24 hours) at 08/30/16 1431 Last data filed at 08/30/16 1247  Gross per 24 hour  Intake              870 ml  Output             1000 ml  Net             -130 ml   Weight change:  Exam:   General:  Pt is alert, follows commands appropriately, not in acute distress  HEENT: No icterus, No thrush, No neck mass, [place]/AT  Cardiovascular:irregular, S1/S2, no rubs, no  gallops  Respiratory: Bilateral wheezing. Good air movement.  Abdomen: Soft/+BS, non tender, non distended, no guarding  Extremities: No edema, No lymphangitis, No petechiae, No rashes, no synovitis   Data Reviewed: I have personally reviewed following labs and imaging  studies Basic Metabolic Panel:  Recent Labs Lab 08/28/16 1130 08/29/16 0625 08/30/16 0544  NA 140 140 138  K 3.9 4.1 4.3  CL 100* 104 104  CO2 32 29 28  GLUCOSE 113* 145* 137*  BUN 42* 37* 43*  CREATININE 1.70* 1.15 1.24  CALCIUM 9.4 8.6* 8.9  MG  --   --  1.8   Liver Function Tests: No results for input(s): AST, ALT, ALKPHOS, BILITOT, PROT, ALBUMIN in the last 168 hours. No results for input(s): LIPASE, AMYLASE in the last 168 hours. No results for input(s): AMMONIA in the last 168 hours. Coagulation Profile: No results for input(s): INR, PROTIME in the last 168 hours. CBC:  Recent Labs Lab 08/28/16 1130 08/29/16 0625  WBC 13.7* 9.0  NEUTROABS 10.9*  --   HGB 10.5* 8.7*  HCT 33.6* 27.9*  MCV 90.1 89.1  PLT 252 243   Cardiac Enzymes:  Recent Labs Lab 08/28/16 1130 08/29/16 0944 08/29/16 1506  TROPONINI 0.03* <0.03 0.03*   BNP: Invalid input(s): POCBNP CBG:  Recent Labs Lab 08/29/16 1124 08/29/16 1654 08/29/16 2019 08/30/16 0726 08/30/16 1132  GLUCAP 139* 121* 137* 121* 150*   HbA1C:  Recent Labs  08/28/16 1130  HGBA1C 5.7*   Urine analysis: No results found for: COLORURINE, APPEARANCEUR, LABSPEC, PHURINE, GLUCOSEU, HGBUR, BILIRUBINUR, KETONESUR, PROTEINUR, UROBILINOGEN, NITRITE, LEUKOCYTESUR Sepsis Labs: @LABRCNTIP (procalcitonin:4,lacticidven:4) ) Recent Results (from the past 240 hour(s))  Culture, blood (Routine X 2) w Reflex to ID Panel     Status: None (Preliminary result)   Collection Time: 08/28/16  3:07 PM  Result Value Ref Range Status   Specimen Description RIGHT ANTECUBITAL  Final   Special Requests   Final    BOTTLES DRAWN AEROBIC AND ANAEROBIC Blood Culture adequate volume   Culture NO GROWTH 2 DAYS  Final   Report Status PENDING  Incomplete  Culture, blood (Routine X 2) w Reflex to ID Panel     Status: None (Preliminary result)   Collection Time: 08/28/16  3:12 PM  Result Value Ref Range Status   Specimen Description  BLOOD RIGHT HAND  Final   Special Requests   Final    BOTTLES DRAWN AEROBIC AND ANAEROBIC Blood Culture adequate volume   Culture NO GROWTH 2 DAYS  Final   Report Status PENDING  Incomplete  MRSA PCR Screening     Status: None   Collection Time: 08/28/16  6:00 PM  Result Value Ref Range Status   MRSA by PCR NEGATIVE NEGATIVE Final    Comment:        The GeneXpert MRSA Assay (FDA approved for NASAL specimens only), is one component of a comprehensive MRSA colonization surveillance program. It is not intended to diagnose MRSA infection nor to guide or monitor treatment for MRSA infections.      Scheduled Meds: . ARIPiprazole  5 mg Oral Daily  . atorvastatin  10 mg Oral Daily  . baclofen  5 mg Oral TID  . budesonide (PULMICORT) nebulizer solution  0.5 mg Nebulization BID  . calcium-vitamin D  1 tablet Oral Q breakfast  . clonazePAM  2 mg Oral BID  . enoxaparin (LOVENOX) injection  40 mg Subcutaneous Q24H  . ferrous sulfate  325 mg Oral BID WC  . finasteride  5 mg Oral Daily  . gabapentin  100 mg Oral TID  . insulin aspart  0-9 Units Subcutaneous TID WC  . ipratropium-albuterol  3 mL Nebulization Q6H WA  . methylPREDNISolone (SOLU-MEDROL) injection  60 mg Intravenous Q6H  . methylPREDNISolone (SOLU-MEDROL) injection  60 mg Intravenous Once  . pneumococcal 23 valent vaccine  0.5 mL Intramuscular Tomorrow-1000  . traMADol  50 mg Oral QID  . traZODone  50 mg Oral QHS  . vitamin C  500 mg Oral BID   Continuous Infusions: . ceFEPime (MAXIPIME) IV 1 g (08/30/16 0604)    Procedures/Studies: Dg Chest Port 1 View  Result Date: 08/29/2016 CLINICAL DATA:  Restrained distress.  COPD. EXAM: PORTABLE CHEST 1 VIEW COMPARISON:  08/28/2016 FINDINGS: There is no focal parenchymal opacity. There is no pleural effusion or pneumothorax. The heart and mediastinal contours are unremarkable. Subchondral sclerosis in the superior right humeral head concerning for avascular necrosis without  articular surface collapse. IMPRESSION: No active disease. Electronically Signed   By: Kathreen Devoid   On: 08/29/2016 12:19   Dg Chest Portable 1 View  Result Date: 08/28/2016 CLINICAL DATA:  Increased congestion and fatigue. EXAM: PORTABLE CHEST 1 VIEW COMPARISON:  Chest x-[facility] from more had [facility] 08/08/2014. Abdominal CT 01/23/2013. FINDINGS: Right apical predominately linear opacity is stable from prior and likely scarring. There is a 13 mm lucency with neighboring hazy density at the left base which likely reflects a cyst seen on 2014 abdominal CT. The lungs are hyperinflated. Normal heart size. Chronic aortic tortuosity. IMPRESSION: 1. Mild hazy density at the left base favoring atelectasis. Superimposed ovoid lucency is likely a lung cyst seen on 2014 abdominal CT. Consider lateral view. 2. COPD and stable right apical scar-like opacity. Electronically Signed   By: Monte Fantasia M.D.   On: 08/28/2016 11:49    Zion Ta, DO  [facility] Pager [phone]  If 7PM-7AM, please contact night-coverage [email or web address] Password [email or web address] 08/30/2016, 2:31 PM   LOS: 2 days

## 2016-08-30 NOTE — Progress Notes (Signed)
Transferred pt to ICU and gave report to [place].

## 2016-08-31 ENCOUNTER — Inpatient Hospital Stay (HOSPITAL_COMMUNITY): Payer: Medicare Other

## 2016-08-31 DIAGNOSIS — I4891 Unspecified atrial fibrillation: Secondary | ICD-10-CM

## 2016-08-31 LAB — BASIC METABOLIC PANEL WITH GFR
Anion gap: 7 (ref 5–15)
BUN: 41 mg/dL — ABNORMAL HIGH (ref 6–20)
CO2: 28 mmol/L (ref 22–32)
Calcium: 9 mg/dL (ref 8.9–10.3)
Chloride: 105 mmol/L (ref 101–111)
Creatinine, Ser: 1.04 mg/dL (ref 0.61–1.24)
GFR calc Af Amer: >60 mL/min
GFR calc non Af Amer: >60 mL/min
Glucose, Bld: 140 mg/dL — ABNORMAL HIGH (ref 65–99)
Potassium: 3.8 mmol/L (ref 3.5–5.1)
Sodium: 140 mmol/L (ref 135–145)

## 2016-08-31 LAB — TSH: TSH: 0.496 u[IU]/mL (ref 0.350–4.500)

## 2016-08-31 LAB — CBC
HEMATOCRIT: 28.5 % — AB (ref 39.0–52.0)
Hemoglobin: 9 g/dL — ABNORMAL LOW (ref 13.0–17.0)
MCH: 28 pg (ref 26.0–34.0)
MCHC: 31.6 g/dL (ref 30.0–36.0)
MCV: 88.8 fL (ref 78.0–100.0)
Platelets: 297 10*3/uL (ref 150–400)
RBC: 3.21 MIL/uL — ABNORMAL LOW (ref 4.22–5.81)
RDW: 15.4 % (ref 11.5–15.5)
WBC: 9.8 10*3/uL (ref 4.0–10.5)

## 2016-08-31 LAB — GLUCOSE, CAPILLARY
GLUCOSE-CAPILLARY: 156 mg/dL — AB (ref 65–99)
Glucose-Capillary: 135 mg/dL — ABNORMAL HIGH (ref 65–99)
Glucose-Capillary: 128 mg/dL — ABNORMAL HIGH (ref 65–99)
Glucose-Capillary: 137 mg/dL — ABNORMAL HIGH (ref 65–99)

## 2016-08-31 MED ORDER — ARFORMOTEROL TARTRATE 15 MCG/2ML IN NEBU
15.0000 ug | INHALATION_SOLUTION | Freq: Two times a day (BID) | RESPIRATORY_TRACT | Status: DC
  Administered 2016-08-31 – 2016-09-03 (×6): 15 ug via RESPIRATORY_TRACT
  Filled 2016-08-31 (×6): qty 2

## 2016-08-31 MED ORDER — DILTIAZEM HCL ER COATED BEADS 180 MG PO CP24
180.0000 mg | ORAL_CAPSULE | Freq: Every day | ORAL | Status: DC
  Administered 2016-09-01: 180 mg via ORAL
  Filled 2016-08-31: qty 1

## 2016-08-31 MED ORDER — PERFLUTREN LIPID MICROSPHERE
1.0000 mL | INTRAVENOUS | Status: AC | PRN
  Administered 2016-08-31: 1 mL via INTRAVENOUS
  Filled 2016-08-31: qty 10

## 2016-08-31 MED ORDER — ALUM & MAG HYDROXIDE-SIMETH 200-200-20 MG/5ML PO SUSP
30.0000 mL | Freq: Four times a day (QID) | ORAL | Status: DC | PRN
  Administered 2016-08-31 – 2016-09-03 (×3): 30 mL via ORAL
  Filled 2016-08-31 (×3): qty 30

## 2016-08-31 MED ORDER — ARFORMOTEROL TARTRATE 15 MCG/2ML IN NEBU: 15.0000 ug | INHALATION_SOLUTION | Freq: Two times a day (BID) | RESPIRATORY_TRACT | Status: DC

## 2016-08-31 NOTE — Progress Notes (Signed)
PROGRESS NOTE  Garrett Spencer SWF:[id number] DOB: 01/18/1946 DOA: 08/28/2016 PCP: Neale Burly, MD  Brief History: 71 y.o.malewith medical history ofbipolar disorder, COPD, diabetes mellitus, hypertension, hyperlipidemia, remote PE, and tremor presents with shortness of breath, coughing, and wheezing for "couple days". The patient states that he stopped smoking 3 years ago after smoking 1-1/2 packs per day 45 years. He denies any fevers, chills, chest pain, nausea, vomiting, diarrhea, vomiting, dysuria, hematuria, headache, neck pain. He has noted increasing generalized weakness and fatigue for the past couple days as well as a cough with yellow sputum. He denies any hemoptysis. He denies any orthopnea or worsening lower extremity edema. Upon EMS arrival, the patient was noted to be hypotensive with blood pressure of 81/60 with oxygen saturation 89% on room air. Blood cultures were obtained, the patient was started on empiric antibiotics. He was fluid resuscitated with improvement of his blood pressure. The patient was placed on bronchodilators and Solu-Medrol with improvement of his respiratory status.  Assessment/Plan: Acute respiratory failure with hypoxia  -Secondary to COPD exacerbation  -Start duo nebs--pt had been refusing--discussed importance of duonebs with pt -Continue Pulmicort  -presently stable on 2L -The patient developed respiratory distress on 08/29/2016 and 08/30/2016 -Transfered to stepdown unit 5/25 due to respiratory distress and SVT -08/30/2013--developed respiratory distress and tachycardia up to the 140s -5/25 CXR--personally reviewed--no infiltrates, no edema -increase lorazepam to 1 mg q 8 hrs prn anxiety  SVT -EKG with lost artifact--personally reviewed--atrial tachycardia, ?MAT -continue diltiazem 30 mg q 6 hrs -08/31/16 still having intermittent runs of atrial tachycardia -Echo -TSh 0.496 -check mag  COPD exacerbation -Remains on 2 L  nasal cannula -Remains dyspneic with minimal exertion -Continue IV Solu-Medrol--increased to q 6 hours -Continue bronchodilators -Continue Pulmicort-increase to 0.5 mg bid -add brovana  Hypotension-->improved -holding coreg, losartan/HCTZ initially -continue IVF-->improved -lactic acid 1.3-->1.8 -check procalcitonin--0.17 -blood cultures x 2 sets--neg to date -continue empiric cefepime pending culture data; d/c vancomycin  AKI -Previous baseline creatinine 1.3 -admission serum creatinine 1.70 -IV fluids-->improving -Repeat amBMP  Diabetes mellitus type 2 -Holding metformin -NovoLog sliding scale -A1C--5.7  Atypical chest pain -order EKG--no ST-T changes -cycle troponins--unremarkable    Disposition Plan: remain stepdown Family Communication: No Family at bedside--Total time spent 35 minutes.  Greater than 50% spent face to face counseling and coordinating care.  Consultants: none  Code Status: FULL  DVT Prophylaxis: [place] Lovenox   Procedures: As Listed in Progress Note Above  Antibiotics: vanco 5/23>>>5/24 Cefepime 5/23>>>    Subjective: Overall he is breathing better. Denies any chest pain, nausea, vomiting, diarrhea, dark pink or dysuria hematuria. He continues to complain of cough with yellow sputum. Denies any headache, neck pain.  Objective: Vitals:   08/31/16 1343 08/31/16 1500 08/31/16 1504 08/31/16 1721  BP: 118/83   121/81  Pulse:      Resp:      Temp:  97.6 F (36.4 C)    TempSrc:  Axillary    SpO2:   95%   Weight:      Height:        Intake/Output Summary (Last 24 hours) at 08/31/16 1723 Last data filed at 08/31/16 1415  Gross per 24 hour  Intake           1097.5 ml  Output              800 ml  Net  297.5 ml   Weight change:  Exam:   General:  Pt is alert, follows commands appropriately, not in acute distress  HEENT: No icterus, No thrush, No neck mass, [place]/AT  Cardiovascular: RRR, S1/S2, no  rubs, no gallops  Respiratory: Diminished breath sounds in the bases. Bibasilar rales with bibasilar wheeze. Good air movement.  Abdomen: Soft/+BS, non tender, non distended, no guarding  Extremities: No edema, No lymphangitis, No petechiae, No rashes, no synovitis   Data Reviewed: I have personally reviewed following labs and imaging studies Basic Metabolic Panel:  Recent Labs Lab 08/28/16 1130 08/29/16 0625 08/30/16 0544 08/31/16 0512  NA 140 140 138 140  K 3.9 4.1 4.3 3.8  CL 100* 104 104 105  CO2 32 29 28 28   GLUCOSE 113* 145* 137* 140*  BUN 42* 37* 43* 41*  CREATININE 1.70* 1.15 1.24 1.04  CALCIUM 9.4 8.6* 8.9 9.0  MG  --   --  1.8  --    Liver Function Tests: No results for input(s): AST, ALT, ALKPHOS, BILITOT, PROT, ALBUMIN in the last 168 hours. No results for input(s): LIPASE, AMYLASE in the last 168 hours. No results for input(s): AMMONIA in the last 168 hours. Coagulation Profile: No results for input(s): INR, PROTIME in the last 168 hours. CBC:  Recent Labs Lab 08/28/16 1130 08/29/16 0625 08/31/16 0512  WBC 13.7* 9.0 9.8  NEUTROABS 10.9*  --   --   HGB 10.5* 8.7* 9.0*  HCT 33.6* 27.9* 28.5*  MCV 90.1 89.1 88.8  PLT 252 243 297   Cardiac Enzymes:  Recent Labs Lab 08/28/16 1130 08/29/16 0944 08/29/16 1506  TROPONINI 0.03* <0.03 0.03*   BNP: Invalid input(s): POCBNP CBG:  Recent Labs Lab 08/30/16 1603 08/30/16 2139 08/31/16 0740 08/31/16 1146 08/31/16 1633  GLUCAP 121* 193* 135* 156* 128*   HbA1C: No results for input(s): HGBA1C in the last 72 hours. Urine analysis: No results found for: COLORURINE, APPEARANCEUR, LABSPEC, PHURINE, GLUCOSEU, HGBUR, BILIRUBINUR, KETONESUR, PROTEINUR, UROBILINOGEN, NITRITE, LEUKOCYTESUR Sepsis Labs: @LABRCNTIP (procalcitonin:4,lacticidven:4) ) Recent Results (from the past 240 hour(s))  Culture, blood (Routine X 2) w Reflex to ID Panel     Status: None (Preliminary result)   Collection Time:  08/28/16  3:07 PM  Result Value Ref Range Status   Specimen Description RIGHT ANTECUBITAL  Final   Special Requests   Final    BOTTLES DRAWN AEROBIC AND ANAEROBIC Blood Culture adequate volume   Culture NO GROWTH 3 DAYS  Final   Report Status PENDING  Incomplete  Culture, blood (Routine X 2) w Reflex to ID Panel     Status: None (Preliminary result)   Collection Time: 08/28/16  3:12 PM  Result Value Ref Range Status   Specimen Description BLOOD RIGHT HAND  Final   Special Requests   Final    BOTTLES DRAWN AEROBIC AND ANAEROBIC Blood Culture adequate volume   Culture NO GROWTH 3 DAYS  Final   Report Status PENDING  Incomplete  MRSA PCR Screening     Status: None   Collection Time: 08/28/16  6:00 PM  Result Value Ref Range Status   MRSA by PCR NEGATIVE NEGATIVE Final    Comment:        The GeneXpert MRSA Assay (FDA approved for NASAL specimens only), is one component of a comprehensive MRSA colonization surveillance program. It is not intended to diagnose MRSA infection nor to guide or monitor treatment for MRSA infections.      Scheduled Meds: . arformoterol  15 mcg  Nebulization BID  . ARIPiprazole  5 mg Oral Daily  . atorvastatin  10 mg Oral Daily  . baclofen  5 mg Oral TID  . budesonide (PULMICORT) nebulizer solution  0.5 mg Nebulization BID  . calcium-vitamin D  1 tablet Oral Q breakfast  . clonazePAM  2 mg Oral BID  . [START ON 09/01/2016] diltiazem  180 mg Oral Daily  . diltiazem  30 mg Oral Q6H  . enoxaparin (LOVENOX) injection  40 mg Subcutaneous Q24H  . ferrous sulfate  325 mg Oral BID WC  . finasteride  5 mg Oral Daily  . gabapentin  100 mg Oral TID  . insulin aspart  0-9 Units Subcutaneous TID WC  . ipratropium-albuterol  3 mL Nebulization Q6H WA  . methylPREDNISolone (SOLU-MEDROL) injection  60 mg Intravenous Q6H  . pneumococcal 23 valent vaccine  0.5 mL Intramuscular Tomorrow-1000  . traMADol  50 mg Oral QID  . traZODone  50 mg Oral QHS  . vitamin C  500  mg Oral BID   Continuous Infusions: . ceFEPime (MAXIPIME) IV Stopped (08/31/16 1415)    Procedures/Studies: Dg Chest Port 1 View  Result Date: 08/30/2016 CLINICAL DATA:  71 y/o  M; respiratory distress.  History of COPD. EXAM: PORTABLE CHEST 1 VIEW COMPARISON:  08/29/2016 chest radiograph. FINDINGS: The heart size and mediastinal contours are within normal limits. Stable scarring in the right lung apex. No focal consolidation, effusion, or pneumothorax identified. Right humeral head subchondral sclerosis is stable and compatible with avascular necrosis. IMPRESSION: No active disease. Electronically Signed   By: Kristine Garbe M.D.   On: 08/30/2016 14:38   Dg Chest Port 1 View  Result Date: 08/29/2016 CLINICAL DATA:  Restrained distress.  COPD. EXAM: PORTABLE CHEST 1 VIEW COMPARISON:  08/28/2016 FINDINGS: There is no focal parenchymal opacity. There is no pleural effusion or pneumothorax. The heart and mediastinal contours are unremarkable. Subchondral sclerosis in the superior right humeral head concerning for avascular necrosis without articular surface collapse. IMPRESSION: No active disease. Electronically Signed   By: Kathreen Devoid   On: 08/29/2016 12:19   Dg Chest Portable 1 View  Result Date: 08/28/2016 CLINICAL DATA:  Increased congestion and fatigue. EXAM: PORTABLE CHEST 1 VIEW COMPARISON:  Chest x-ray from more had [facility] 08/08/2014. Abdominal CT 01/23/2013. FINDINGS: Right apical predominately linear opacity is stable from prior and likely scarring. There is a 13 mm lucency with neighboring hazy density at the left base which likely reflects a cyst seen on 2014 abdominal CT. The lungs are hyperinflated. Normal heart size. Chronic aortic tortuosity. IMPRESSION: 1. Mild hazy density at the left base favoring atelectasis. Superimposed ovoid lucency is likely a lung cyst seen on 2014 abdominal CT. Consider lateral view. 2. COPD and stable right apical scar-like opacity.  Electronically Signed   By: Monte Fantasia M.D.   On: 08/28/2016 11:49    Ozzie Remmers, DO  [facility] Pager [phone]  If 7PM-7AM, please contact night-coverage [email or web address] Password [email or web address] 08/31/2016, 5:23 PM   LOS: 3 days

## 2016-08-31 NOTE — Progress Notes (Signed)
*  PRELIMINARY RESULTS* Echocardiogram 2D Echocardiogram with definity has been performed.  Garrett Spencer 08/31/2016, 10:30 AM

## 2016-09-01 LAB — BASIC METABOLIC PANEL
ANION GAP: 5 (ref 5–15)
BUN: 45 mg/dL — ABNORMAL HIGH (ref 6–20)
CALCIUM: 9.1 mg/dL (ref 8.9–10.3)
CO2: 28 mmol/L (ref 22–32)
CREATININE: 1.14 mg/dL (ref 0.61–1.24)
Chloride: 106 mmol/L (ref 101–111)
Glucose, Bld: 152 mg/dL — ABNORMAL HIGH (ref 65–99)
Potassium: 3.9 mmol/L (ref 3.5–5.1)
SODIUM: 139 mmol/L (ref 135–145)

## 2016-09-01 LAB — PROCALCITONIN: Procalcitonin: <0.1 ng/mL

## 2016-09-01 LAB — GLUCOSE, CAPILLARY
GLUCOSE-CAPILLARY: 173 mg/dL — AB (ref 65–99)
GLUCOSE-CAPILLARY: 132 mg/dL — AB (ref 65–99)
Glucose-Capillary: 151 mg/dL — ABNORMAL HIGH (ref 65–99)
Glucose-Capillary: 149 mg/dL — ABNORMAL HIGH (ref 65–99)

## 2016-09-01 LAB — MAGNESIUM: Magnesium: 1.9 mg/dL (ref 1.7–2.4)

## 2016-09-01 MED ORDER — DILTIAZEM HCL ER COATED BEADS 240 MG PO CP24
240.0000 mg | ORAL_CAPSULE | Freq: Every day | ORAL | Status: DC
  Administered 2016-09-02 – 2016-09-03 (×2): 240 mg via ORAL
  Filled 2016-09-01 (×2): qty 1

## 2016-09-01 MED ORDER — METOPROLOL TARTRATE 5 MG/5ML IV SOLN
2.5000 mg | Freq: Once | INTRAVENOUS | Status: AC
  Administered 2016-09-01: 2.5 mg via INTRAVENOUS
  Filled 2016-09-01: qty 5

## 2016-09-01 MED ORDER — PREDNISONE 20 MG PO TABS
60.0000 mg | ORAL_TABLET | Freq: Every day | ORAL | Status: DC
  Administered 2016-09-02 – 2016-09-03 (×2): 60 mg via ORAL
  Filled 2016-09-01 (×2): qty 3

## 2016-09-01 NOTE — Progress Notes (Signed)
Pharmacy Antibiotic Note  Garrett Spencer is a 71 y.o. male admitted on 08/28/2016 with sepsis.  Pharmacy has been consulted for Cefepime dosing.  Plan:  Cont Cefepime 1gm IV q8h F/U cxs and clinical progress Monitor V/S, labs, and levels as indicated  Height: 5\' 11"  (180.3 cm) Weight: 149 lb 7.6 oz (67.8 kg) IBW/kg (Calculated) : 75.3  Temp (24hrs), Avg:97.7 F (36.5 C), Min:97.5 F (36.4 C), Max:97.9 F (36.6 C)   Recent Labs Lab 08/28/16 1130 08/28/16 1144 08/28/16 1423 08/29/16 0625 08/30/16 0544 08/31/16 0512 09/01/16 0409  WBC 13.7*  --   --  9.0  --  9.8  --   CREATININE 1.70*  --   --  1.15 1.24 1.04 1.14  LATICACIDVEN  --  1.3 1.8  --   --   --   --     Estimated Creatinine Clearance: 57.8 mL/min (by C-G formula based on SCr of 1.14 mg/dL).    Allergies  Allergen Reactions  . Benadryl [Diphenhydramine]     unknown  . Haldol [Haloperidol] Other (See Comments)    unknown  . Shellfish Allergy Other (See Comments)    unknown    Antimicrobials this admission: Vancomycin 5/23 >> 5/24 Cefepime 5/23 >>  Microbiology results: 5/23 BCx: ngtd 5/23 MRSA PCR: negative   Thank you for allowing pharmacy to be a part of this patient's care.  Bartholomew Crews D Clinical Pharmacist 09/01/2016 9:46 AM

## 2016-09-01 NOTE — Progress Notes (Signed)
PROGRESS NOTE  Garrett Spencer:[id number] DOB: 26-Dec-1945 DOA: 08/28/2016 PCP: Neale Burly, MD  Brief History: 71 y.o.malewith medical history ofbipolar disorder, COPD, diabetes mellitus, hypertension, hyperlipidemia, remote PE, and tremor presents with shortness of breath, coughing, and wheezing for "couple days". The patient states that he stopped smoking 3 years ago after smoking 1-1/2 packs per day 45 years. He denies any fevers, chills, chest pain, nausea, vomiting, diarrhea, vomiting, dysuria, hematuria, headache, neck pain. He has noted increasing generalized weakness and fatigue for the past couple days as well as a cough with yellow sputum. He denies any hemoptysis. He denies any orthopnea or worsening lower extremity edema. Upon EMS arrival, the patient was noted to be hypotensive with blood pressure of 81/60 with oxygen saturation 89% on room air. Blood cultures were obtained, the patient was started on empiric antibiotics. He was fluid resuscitated with improvement of his blood pressure. The patient was placed on bronchodilators and Solu-Medrol with improvement of his respiratory status.  Assessment/Plan: Acute respiratory failure with hypoxia  -Secondary to COPD exacerbation  -Start duo nebs--pt had been refusing--discussed importance of duonebs with pt -Continue Pulmicort  -presently stable on 2L -The patient developed respiratory distress on 08/29/2016 and 08/30/2016 -Transfered to stepdown unit 5/25 due to respiratory distress and SVT -08/30/2013--developed respiratory distress and tachycardia up to the 140s -5/25 CXR--personally reviewed--no infiltrates, no edema -increase lorazepam to 1 mg q 8 hrs prn anxiety  SVT -EKG with lost artifact--personally reviewed--atrial tachycardia, ?MAT -continue diltiazem 30 mg q 6 hrs-->cardizem CD 180mg  daily -08/31/16 still having intermittent runs of atrial tachycardia -Echo--results pending -TSh 0.496 -check  mag--1.9  COPD exacerbation -Remains on 2 L nasal cannula -Remains dyspneic with minimal exertion -Continue IV Solu-Medrol--decrease to q 12 hours -Continue bronchodilators -Continue Pulmicort-increase to 0.5 mg bid -add brovana  Hypotension-->improved -holding coreg, losartan/HCTZ initially -continue IVF-->improved -lactic acid 1.3-->1.8 -check procalcitonin--0.17 -blood cultures x 2 sets--neg to date -continue empiric cefepime pending culture data; d/c vancomycin -finish 5 days cefepime  AKI -Previous baseline creatinine 1.3 -admission serum creatinine 1.70 -IV fluids-->improving -Repeat amBMP  Diabetes mellitus type 2 -Holding metformin -NovoLog sliding scale -A1C--5.7  Atypical chest pain -order EKG--no ST-T changes -cycle troponins--unremarkable    Disposition Plan: SNF on 5/28 if stable Family Communication: No Family at bedside--Total time spent 35 minutes.  Greater than 50% spent face to face counseling and coordinating care.  Consultants: none  Code Status: FULL  DVT Prophylaxis: [place] Lovenox   Procedures: As Listed in Progress Note Above  Antibiotics: vanco 5/23>>>5/24 Cefepime 5/23>>>5/27     Subjective: Patient denies fevers, chills, headache, chest pain, dyspnea, nausea, vomiting, diarrhea, abdominal pain, dysuria, hematuria, hematochezia, and melena.  Still with cough with yellow sputum.  No hemoptysis   Objective: Vitals:   09/01/16 0905 09/01/16 0921 09/01/16 0939 09/01/16 1120  BP:   124/80   Pulse:      Resp:    (!) 21  Temp:    97.8 F (36.6 C)  TempSrc:    Oral  SpO2: 100% 100%    Weight:      Height:        Intake/Output Summary (Last 24 hours) at 09/01/16 1201 Last data filed at 09/01/16 1149  Gross per 24 hour  Intake              630 ml  Output              300  ml  Net              330 ml   Weight change:  Exam:   General:  Pt is alert, follows commands appropriately, not in acute  distress  HEENT: No icterus, No thrush, No neck mass, [place]/AT  Cardiovascular: RRR, S1/S2, no rubs, no gallops  Respiratory: Bibasilar rales with minimal bibasilar wheezes. Good air movement.  Abdomen: Soft/+BS, non tender, non distended, no guarding  Extremities: No edema, No lymphangitis, No petechiae, No rashes, no synovitis   Data Reviewed: I have personally reviewed following labs and imaging studies Basic Metabolic Panel:  Recent Labs Lab 08/28/16 1130 08/29/16 0625 08/30/16 0544 08/31/16 0512 09/01/16 0409  NA 140 140 138 140 139  K 3.9 4.1 4.3 3.8 3.9  CL 100* 104 104 105 106  CO2 32 29 28 28 28   GLUCOSE 113* 145* 137* 140* 152*  BUN 42* 37* 43* 41* 45*  CREATININE 1.70* 1.15 1.24 1.04 1.14  CALCIUM 9.4 8.6* 8.9 9.0 9.1  MG  --   --  1.8  --  1.9   Liver Function Tests: No results for input(s): AST, ALT, ALKPHOS, BILITOT, PROT, ALBUMIN in the last 168 hours. No results for input(s): LIPASE, AMYLASE in the last 168 hours. No results for input(s): AMMONIA in the last 168 hours. Coagulation Profile: No results for input(s): INR, PROTIME in the last 168 hours. CBC:  Recent Labs Lab 08/28/16 1130 08/29/16 0625 08/31/16 0512  WBC 13.7* 9.0 9.8  NEUTROABS 10.9*  --   --   HGB 10.5* 8.7* 9.0*  HCT 33.6* 27.9* 28.5*  MCV 90.1 89.1 88.8  PLT 252 243 297   Cardiac Enzymes:  Recent Labs Lab 08/28/16 1130 08/29/16 0944 08/29/16 1506  TROPONINI 0.03* <0.03 0.03*   BNP: Invalid input(s): POCBNP CBG:  Recent Labs Lab 08/31/16 1146 08/31/16 1633 08/31/16 2112 09/01/16 0713 09/01/16 1119  GLUCAP 156* 128* 137* 173* 132*   HbA1C: No results for input(s): HGBA1C in the last 72 hours. Urine analysis: No results found for: COLORURINE, APPEARANCEUR, LABSPEC, PHURINE, GLUCOSEU, HGBUR, BILIRUBINUR, KETONESUR, PROTEINUR, UROBILINOGEN, NITRITE, LEUKOCYTESUR Sepsis Labs: @LABRCNTIP (procalcitonin:4,lacticidven:4) ) Recent Results (from the past 240 hour(s))   Culture, blood (Routine X 2) w Reflex to ID Panel     Status: None (Preliminary result)   Collection Time: 08/28/16  3:07 PM  Result Value Ref Range Status   Specimen Description RIGHT ANTECUBITAL  Final   Special Requests   Final    BOTTLES DRAWN AEROBIC AND ANAEROBIC Blood Culture adequate volume   Culture NO GROWTH 4 DAYS  Final   Report Status PENDING  Incomplete  Culture, blood (Routine X 2) w Reflex to ID Panel     Status: None (Preliminary result)   Collection Time: 08/28/16  3:12 PM  Result Value Ref Range Status   Specimen Description BLOOD RIGHT HAND  Final   Special Requests   Final    BOTTLES DRAWN AEROBIC AND ANAEROBIC Blood Culture adequate volume   Culture NO GROWTH 4 DAYS  Final   Report Status PENDING  Incomplete  MRSA PCR Screening     Status: None   Collection Time: 08/28/16  6:00 PM  Result Value Ref Range Status   MRSA by PCR NEGATIVE NEGATIVE Final    Comment:        The GeneXpert MRSA Assay (FDA approved for NASAL specimens only), is one component of a comprehensive MRSA colonization surveillance program. It is not intended to diagnose MRSA  infection nor to guide or monitor treatment for MRSA infections.      Scheduled Meds: . arformoterol  15 mcg Nebulization BID  . ARIPiprazole  5 mg Oral Daily  . atorvastatin  10 mg Oral Daily  . baclofen  5 mg Oral TID  . budesonide (PULMICORT) nebulizer solution  0.5 mg Nebulization BID  . calcium-vitamin D  1 tablet Oral Q breakfast  . clonazePAM  2 mg Oral BID  . diltiazem  180 mg Oral Daily  . enoxaparin (LOVENOX) injection  40 mg Subcutaneous Q24H  . ferrous sulfate  325 mg Oral BID WC  . finasteride  5 mg Oral Daily  . gabapentin  100 mg Oral TID  . insulin aspart  0-9 Units Subcutaneous TID WC  . ipratropium-albuterol  3 mL Nebulization Q6H WA  . methylPREDNISolone (SOLU-MEDROL) injection  60 mg Intravenous Q6H  . pneumococcal 23 valent vaccine  0.5 mL Intramuscular Tomorrow-1000  . traMADol  50 mg  Oral QID  . traZODone  50 mg Oral QHS  . vitamin C  500 mg Oral BID   Continuous Infusions: . ceFEPime (MAXIPIME) IV 1 g (09/01/16 0175)    Procedures/Studies: Dg Chest Port 1 View  Result Date: 08/30/2016 CLINICAL DATA:  71 y/o  M; respiratory distress.  History of COPD. EXAM: PORTABLE CHEST 1 VIEW COMPARISON:  08/29/2016 chest radiograph. FINDINGS: The heart size and mediastinal contours are within normal limits. Stable scarring in the right lung apex. No focal consolidation, effusion, or pneumothorax identified. Right humeral head subchondral sclerosis is stable and compatible with avascular necrosis. IMPRESSION: No active disease. Electronically Signed   By: Kristine Garbe M.D.   On: 08/30/2016 14:38   Dg Chest Port 1 View  Result Date: 08/29/2016 CLINICAL DATA:  Restrained distress.  COPD. EXAM: PORTABLE CHEST 1 VIEW COMPARISON:  08/28/2016 FINDINGS: There is no focal parenchymal opacity. There is no pleural effusion or pneumothorax. The heart and mediastinal contours are unremarkable. Subchondral sclerosis in the superior right humeral head concerning for avascular necrosis without articular surface collapse. IMPRESSION: No active disease. Electronically Signed   By: Kathreen Devoid   On: 08/29/2016 12:19   Dg Chest Portable 1 View  Result Date: 08/28/2016 CLINICAL DATA:  Increased congestion and fatigue. EXAM: PORTABLE CHEST 1 VIEW COMPARISON:  Chest x-ray from more had [facility] 08/08/2014. Abdominal CT 01/23/2013. FINDINGS: Right apical predominately linear opacity is stable from prior and likely scarring. There is a 13 mm lucency with neighboring hazy density at the left base which likely reflects a cyst seen on 2014 abdominal CT. The lungs are hyperinflated. Normal heart size. Chronic aortic tortuosity. IMPRESSION: 1. Mild hazy density at the left base favoring atelectasis. Superimposed ovoid lucency is likely a lung cyst seen on 2014 abdominal CT. Consider lateral view. 2. COPD  and stable right apical scar-like opacity. Electronically Signed   By: Monte Fantasia M.D.   On: 08/28/2016 11:49    Isela Stantz, DO  [facility] Pager [phone]  If 7PM-7AM, please contact night-coverage [email or web address] Password [email or web address] 09/01/2016, 12:01 PM   LOS: 4 days

## 2016-09-02 DIAGNOSIS — I950 Idiopathic hypotension: Secondary | ICD-10-CM

## 2016-09-02 LAB — BASIC METABOLIC PANEL
Anion gap: 8 (ref 5–15)
BUN: 47 mg/dL — ABNORMAL HIGH (ref 6–20)
CO2: 28 mmol/L (ref 22–32)
CREATININE: 1.09 mg/dL (ref 0.61–1.24)
Calcium: 9.4 mg/dL (ref 8.9–10.3)
Chloride: 106 mmol/L (ref 101–111)
GFR calc non Af Amer: >60 mL/min (ref >60)
GLUCOSE: 135 mg/dL — AB (ref 65–99)
Potassium: 4 mmol/L (ref 3.5–5.1)
Sodium: 142 mmol/L (ref 135–145)

## 2016-09-02 LAB — GLUCOSE, CAPILLARY
GLUCOSE-CAPILLARY: 99 mg/dL (ref 65–99)
Glucose-Capillary: 133 mg/dL — ABNORMAL HIGH (ref 65–99)
Glucose-Capillary: 161 mg/dL — ABNORMAL HIGH (ref 65–99)
Glucose-Capillary: 122 mg/dL — ABNORMAL HIGH (ref 65–99)

## 2016-09-02 LAB — CULTURE, BLOOD (ROUTINE X 2)
Culture: NO GROWTH
Culture: NO GROWTH
SPECIAL REQUESTS: ADEQUATE
Special Requests: ADEQUATE

## 2016-09-02 LAB — MAGNESIUM: Magnesium: 1.9 mg/dL (ref 1.7–2.4)

## 2016-09-02 MED ORDER — ASPIRIN 81 MG PO CHEW
81.0000 mg | CHEWABLE_TABLET | Freq: Every day | ORAL | Status: DC
  Administered 2016-09-02 – 2016-09-03 (×2): 81 mg via ORAL
  Filled 2016-09-02 (×2): qty 1

## 2016-09-02 MED ORDER — METOPROLOL TARTRATE 25 MG PO TABS
25.0000 mg | ORAL_TABLET | Freq: Two times a day (BID) | ORAL | Status: DC
  Administered 2016-09-02 – 2016-09-03 (×3): 25 mg via ORAL
  Filled 2016-09-02 (×3): qty 1

## 2016-09-02 NOTE — NC FL2 (Signed)
[place] LEVEL OF CARE SCREENING TOOL     IDENTIFICATION  Patient Name: Garrett Spencer Birthdate: 12/25/1945 Sex: male Admission Date (Current Location): 08/28/2016  [place] and [place] Number:  [place] [facility] [id number] [place] and Address:  [facility] [address] [place]      Provider Number: [id number]  Attending Physician Name and Address:  Orson Eva, MD  Relative Name and Phone Number:       Current Level of Care: Hospital Recommended Level of Care: [place] Prior Approval Number:    Date Approved/Denied:   PASRR Number: [id number]  Discharge Plan: SNF    Current Diagnoses: Patient Active Problem List   Diagnosis Date Noted  . SVT (supraventricular tachycardia) ([place]) 08/30/2016  . Respiratory distress   . COPD exacerbation ([place]) 08/28/2016  . Hypotension 08/28/2016  . AKI (acute kidney injury) ([place]) 08/28/2016  . Acute respiratory failure with hypoxia ([place]) 08/28/2016  . COPD with acute exacerbation (HCC)     Orientation RESPIRATION BLADDER Height & Weight     Self, Place, Situation  O2 (3 L) External catheter Weight: 149 lb 7.6 oz (67.8 kg) Height:  5\' 11"  (180.3 cm)  BEHAVIORAL SYMPTOMS/MOOD NEUROLOGICAL BOWEL NUTRITION STATUS  Other (Comment) (none)  (n/a) Continent Diet (Heart healthy/carb modified)  AMBULATORY STATUS COMMUNICATION OF NEEDS Skin   Extensive Assist Verbally Bruising, Other (Comment) (Skin tear to right hand)                       Personal Care Assistance Level of Assistance  Bathing, Feeding, Dressing Bathing Assistance: Maximum assistance Feeding assistance: Limited assistance Dressing Assistance: Maximum assistance     Functional Limitations Info  Hearing, Sight, Speech Sight Info: Adequate Hearing Info: Impaired Speech Info: Adequate    SPECIAL CARE FACTORS FREQUENCY                       Contractures      Additional Factors Info  Insulin  Sliding Scale Code Status Info: Full code Allergies Info: Benadryl (Diphenhydramine), Haldo, (Haloperidol), Shellfish allergy           Current Medications (09/02/2016):  This is the current hospital active medication list Current Facility-Administered Medications  Medication Dose Route Frequency Provider Last Rate Last Dose  . acetaminophen (TYLENOL) tablet 650 mg  650 mg Oral Q6H PRN Tat, David, MD       Or  . acetaminophen (TYLENOL) suppository 650 mg  650 mg Rectal Q6H PRN Tat, Shanon Brow, MD      . albuterol (PROVENTIL) (2.5 MG/3ML) 0.083% nebulizer solution 2.5 mg  2.5 mg Nebulization Q4H PRN Tat, Shanon Brow, MD   2.5 mg at 09/01/16 0615  . alum & mag hydroxide-simeth (MAALOX/MYLANTA) 200-200-20 MG/5ML suspension 30 mL  30 mL Oral Q6H PRN Tat, David, MD   30 mL at 09/02/16 1524  . arformoterol (BROVANA) nebulizer solution 15 mcg  15 mcg Nebulization BID Orson Eva, MD   15 mcg at 09/02/16 3474  . ARIPiprazole (ABILIFY) tablet 5 mg  5 mg Oral Daily Tat, David, MD   5 mg at 09/02/16 0816  . aspirin chewable tablet 81 mg  81 mg Oral Daily Tat, Shanon Brow, MD   81 mg at 09/02/16 0958  . atorvastatin (LIPITOR) tablet 10 mg  10 mg Oral Daily Tat, David, MD   10 mg at 09/02/16 0817  . baclofen (LIORESAL) tablet 5 mg  5 mg Oral TID Tat, Shanon Brow,  MD   5 mg at 09/02/16 1524  . budesonide (PULMICORT) nebulizer solution 0.5 mg  0.5 mg Nebulization BID Tat, David, MD   0.5 mg at 09/02/16 0825  . calcium-vitamin D (OSCAL WITH D) 500-200 MG-UNIT per tablet 1 tablet  1 tablet Oral Q breakfast Tat, David, MD   1 tablet at 09/02/16 0919  . clonazePAM (KLONOPIN) tablet 2 mg  2 mg Oral BID Orson Eva, MD   2 mg at 09/02/16 0919  . diltiazem (CARDIZEM CD) 24 hr capsule 240 mg  240 mg Oral Daily Tat, David, MD   240 mg at 09/02/16 0816  . enoxaparin (LOVENOX) injection 40 mg  40 mg Subcutaneous Q24H Orson Eva, MD   40 mg at 09/01/16 2134  . ferrous sulfate tablet 325 mg  325 mg Oral BID WC Orson Eva, MD   325 mg at  09/02/16 1523  . finasteride (PROSCAR) tablet 5 mg  5 mg Oral Daily Tat, David, MD   5 mg at 09/02/16 0816  . gabapentin (NEURONTIN) capsule 100 mg  100 mg Oral TID Orson Eva, MD   100 mg at 09/02/16 1524  . insulin aspart (novoLOG) injection 0-9 Units  0-9 Units Subcutaneous TID WC Orson Eva, MD   2 Units at 09/02/16 1154  . ipratropium-albuterol (DUONEB) 0.5-2.5 (3) MG/3ML nebulizer solution 3 mL  3 mL Nebulization Q6H Marcial Pacas, MD   3 mL at 09/02/16 1453  . LORazepam (ATIVAN) tablet 1 mg  1 mg Oral Q8H PRN Tat, David, MD   1 mg at 09/02/16 0100  . metoprolol tartrate (LOPRESSOR) tablet 25 mg  25 mg Oral BID Orson Eva, MD   25 mg at 09/02/16 0958  . ondansetron (ZOFRAN) tablet 4 mg  4 mg Oral Q6H PRN Tat, Shanon Brow, MD       Or  . ondansetron (ZOFRAN) injection 4 mg  4 mg Intravenous Q6H PRN Tat, David, MD      . pneumococcal 23 valent vaccine (PNU-IMMUNE) injection 0.5 mL  0.5 mL Intramuscular Tomorrow-1000 Tat, David, MD      . predniSONE (DELTASONE) tablet 60 mg  60 mg Oral Q breakfast Tat, Shanon Brow, MD   60 mg at 09/02/16 0817  . traMADol (ULTRAM) tablet 50 mg  50 mg Oral QID Orson Eva, MD   50 mg at 09/02/16 1414  . traZODone (DESYREL) tablet 50 mg  50 mg Oral Benay Pike, MD   50 mg at 09/01/16 2135  . vitamin C (ASCORBIC ACID) tablet 500 mg  500 mg Oral BID Orson Eva, MD   500 mg at 09/02/16 7680     Discharge Medications: Please see discharge summary for a list of discharge medications.  Relevant Imaging Results:  Relevant Lab Results:   Additional Information    Salome Arnt, [place]

## 2016-09-02 NOTE — Progress Notes (Signed)
PROGRESS NOTE  Garrett Spencer:[id number] DOB: [birth year] DOA: 08/28/2016 PCP: Neale Burly, MD  Brief History: 71 y.o.malewith medical history ofbipolar disorder, COPD, diabetes mellitus, hypertension, hyperlipidemia, remote PE, and tremor presents with shortness of breath, coughing, and wheezing for "couple days". The patient states that he stopped smoking 3 years ago after smoking 1-1/2 packs per day 45 years. He denies any fevers, chills, chest pain, nausea, vomiting, diarrhea, vomiting, dysuria, hematuria, headache, neck pain. He has noted increasing generalized weakness and fatigue for the past couple days as well as a cough with yellow sputum. He denies any hemoptysis. He denies any orthopnea or worsening lower extremity edema. Upon EMS arrival, the patient was noted to be hypotensive with blood pressure of 81/60 with oxygen saturation 89% on room air. Blood cultures were obtained, the patient was started on empiric antibiotics. He was fluid resuscitated with improvement of his blood pressure. The patient was placed on bronchodilators and Solu-Medrol with improvement of his respiratory status.  Assessment/Plan: Acute respiratory failure with hypoxia  -Secondary to COPD exacerbation  -Start duo nebs--pt had been refusing--discussed importance of duonebs with pt -ContinuePulmicort  -presently stable on 2L -The patient developed respiratory distress on 08/29/2016 and 08/30/2016 -Transferedto stepdown unit 5/25 due to respiratory distress and SVT -08/30/2013--developed respiratory distress and tachycardia up to the 140s -5/25 CXR--personally reviewed--no infiltrates, no edema -increase lorazepam to 1 mg q 8 hrs prn anxiety  SVT/Atrial tachycardia -EKG atrial tachycardia, ?MAT -increase cardizem CD to 240 mg daily -add metoprolol tartrate 25 mg bid -His tachycardia arrhythmia as a function of his COPD and respiratory compromise -09/01/16 still having intermittent  runs of atrial tachycardia but less frequent -Echo--results pending -TSH 0.496 -check mag--1.9  COPD exacerbation -Remains on 2 L nasal cannula -Remains dyspneic with minimal exertion -Continue IV Solu-Medrol--decreaseto q 12 hours-->wean to prednisone -Continue bronchodilators -Continue Pulmicort-increase to 0.5 mg bid -add brovana  Hypotension-->improved -holding BB, losartan/HCTZ initially -continue IVF-->improved -lactic acid 1.3-->1.8 -check procalcitonin--0.17 -blood cultures x 2 sets--neg to date -continue empiric cefepime pending culture data; d/c vancomycin -finish 5 days cefepime  AKI -Previous baseline creatinine 1.3 -admission serum creatinine 1.70 -IV fluids-->improving -Repeat amBMP  Diabetes mellitus type 2 -Holding metformin -NovoLog sliding scale -A1C--5.7  Atypical chest pain -order EKG--no ST-T changes -cycle troponins--unremarkable    Disposition Plan: SNF on 5/29 if stable Family Communication: No Family at bedside  Consultants: none  Code Status: FULL  DVT Prophylaxis: [place] Lovenox   Procedures: As Listed in Progress Note Above  Antibiotics: vanco 5/23>>>5/24 Cefepime 5/23>>>5/27    Subjective: Overall he is breathing better.  He still has some dyspnea with moderate exertion. Denies any nausea, vomiting, diarrhea, abdominal pain. He still has a cough with yellow sputum. No hemoptysis. Denies any fevers, chills, chest pain, headache.  Objective: Vitals:   09/02/16 0800 09/02/16 0819 09/02/16 0825 09/02/16 0828  BP:      Pulse:      Resp:      Temp: 97.5 F (36.4 C)     TempSrc: Oral     SpO2:  99% 99% 99%  Weight:      Height:        Intake/Output Summary (Last 24 hours) at 09/02/16 0920 Last data filed at 09/02/16 0500  Gross per 24 hour  Intake              240 ml  Output  850 ml  Net             -610 ml   Weight change:  Exam:   General:  Pt is alert, follows commands  appropriately, not in acute distress  HEENT: No icterus, No thrush, No neck mass, [place]/AT  Cardiovascular: RRR, S1/S2, no rubs, no gallops  Respiratory: Diminished breath sounds bilaterally. Bibasilar rales. No wheezing.  Abdomen: Soft/+BS, non tender, non distended, no guarding  Extremities: No edema, No lymphangitis, No petechiae, No rashes, no synovitis   Data Reviewed: I have personally reviewed following labs and imaging studies Basic Metabolic Panel:  Recent Labs Lab 08/29/16 0625 08/30/16 0544 08/31/16 0512 09/01/16 0409 09/02/16 0428  NA 140 138 140 139 142  K 4.1 4.3 3.8 3.9 4.0  CL 104 104 105 106 106  CO2 29 28 28 28 28   GLUCOSE 145* 137* 140* 152* 135*  BUN 37* 43* 41* 45* 47*  CREATININE 1.15 1.24 1.04 1.14 1.09  CALCIUM 8.6* 8.9 9.0 9.1 9.4  MG  --  1.8  --  1.9 1.9   Liver Function Tests: No results for input(s): AST, ALT, ALKPHOS, BILITOT, PROT, ALBUMIN in the last 168 hours. No results for input(s): LIPASE, AMYLASE in the last 168 hours. No results for input(s): AMMONIA in the last 168 hours. Coagulation Profile: No results for input(s): INR, PROTIME in the last 168 hours. CBC:  Recent Labs Lab 08/28/16 1130 08/29/16 0625 08/31/16 0512  WBC 13.7* 9.0 9.8  NEUTROABS 10.9*  --   --   HGB 10.5* 8.7* 9.0*  HCT 33.6* 27.9* 28.5*  MCV 90.1 89.1 88.8  PLT 252 243 297   Cardiac Enzymes:  Recent Labs Lab 08/28/16 1130 08/29/16 0944 08/29/16 1506  TROPONINI 0.03* <0.03 0.03*   BNP: Invalid input(s): POCBNP CBG:  Recent Labs Lab 09/01/16 0713 09/01/16 1119 09/01/16 1556 09/01/16 2106 09/02/16 0729  GLUCAP 173* 132* 151* 149* 133*   HbA1C: No results for input(s): HGBA1C in the last 72 hours. Urine analysis: No results found for: COLORURINE, APPEARANCEUR, LABSPEC, PHURINE, GLUCOSEU, HGBUR, BILIRUBINUR, KETONESUR, PROTEINUR, UROBILINOGEN, NITRITE, LEUKOCYTESUR Sepsis Labs: @LABRCNTIP (procalcitonin:4,lacticidven:4) ) Recent Results  (from the past 240 hour(s))  Culture, blood (Routine X 2) w Reflex to ID Panel     Status: None   Collection Time: 08/28/16  3:07 PM  Result Value Ref Range Status   Specimen Description RIGHT ANTECUBITAL  Final   Special Requests   Final    BOTTLES DRAWN AEROBIC AND ANAEROBIC Blood Culture adequate volume   Culture NO GROWTH 5 DAYS  Final   Report Status 09/02/2016 FINAL  Final  Culture, blood (Routine X 2) w Reflex to ID Panel     Status: None   Collection Time: 08/28/16  3:12 PM  Result Value Ref Range Status   Specimen Description BLOOD RIGHT HAND  Final   Special Requests   Final    BOTTLES DRAWN AEROBIC AND ANAEROBIC Blood Culture adequate volume   Culture NO GROWTH 5 DAYS  Final   Report Status 09/02/2016 FINAL  Final  MRSA PCR Screening     Status: None   Collection Time: 08/28/16  6:00 PM  Result Value Ref Range Status   MRSA by PCR NEGATIVE NEGATIVE Final    Comment:        The GeneXpert MRSA Assay (FDA approved for NASAL specimens only), is one component of a comprehensive MRSA colonization surveillance program. It is not intended to diagnose MRSA infection nor to guide or  monitor treatment for MRSA infections.      Scheduled Meds: . arformoterol  15 mcg Nebulization BID  . ARIPiprazole  5 mg Oral Daily  . atorvastatin  10 mg Oral Daily  . baclofen  5 mg Oral TID  . budesonide (PULMICORT) nebulizer solution  0.5 mg Nebulization BID  . calcium-vitamin D  1 tablet Oral Q breakfast  . clonazePAM  2 mg Oral BID  . diltiazem  240 mg Oral Daily  . enoxaparin (LOVENOX) injection  40 mg Subcutaneous Q24H  . ferrous sulfate  325 mg Oral BID WC  . finasteride  5 mg Oral Daily  . gabapentin  100 mg Oral TID  . insulin aspart  0-9 Units Subcutaneous TID WC  . ipratropium-albuterol  3 mL Nebulization Q6H WA  . metoprolol tartrate  25 mg Oral BID  . pneumococcal 23 valent vaccine  0.5 mL Intramuscular Tomorrow-1000  . predniSONE  60 mg Oral Q breakfast  . traMADol  50  mg Oral QID  . traZODone  50 mg Oral QHS  . vitamin C  500 mg Oral BID   Continuous Infusions:  Procedures/Studies: Dg Chest Port 1 View  Result Date: 08/30/2016 CLINICAL DATA:  71 y/o  M; respiratory distress.  History of COPD. EXAM: PORTABLE CHEST 1 VIEW COMPARISON:  08/29/2016 chest radiograph. FINDINGS: The heart size and mediastinal contours are within normal limits. Stable scarring in the right lung apex. No focal consolidation, effusion, or pneumothorax identified. Right humeral head subchondral sclerosis is stable and compatible with avascular necrosis. IMPRESSION: No active disease. Electronically Signed   By: Kristine Garbe M.D.   On: 08/30/2016 14:38   Dg Chest Port 1 View  Result Date: 08/29/2016 CLINICAL DATA:  Restrained distress.  COPD. EXAM: PORTABLE CHEST 1 VIEW COMPARISON:  08/28/2016 FINDINGS: There is no focal parenchymal opacity. There is no pleural effusion or pneumothorax. The heart and mediastinal contours are unremarkable. Subchondral sclerosis in the superior right humeral head concerning for avascular necrosis without articular surface collapse. IMPRESSION: No active disease. Electronically Signed   By: Kathreen Devoid   On: 08/29/2016 12:19   Dg Chest Portable 1 View  Result Date: 08/28/2016 CLINICAL DATA:  Increased congestion and fatigue. EXAM: PORTABLE CHEST 1 VIEW COMPARISON:  Chest x-ray from more had [facility] 08/08/2014. Abdominal CT 01/23/2013. FINDINGS: Right apical predominately linear opacity is stable from prior and likely scarring. There is a 13 mm lucency with neighboring hazy density at the left base which likely reflects a cyst seen on 2014 abdominal CT. The lungs are hyperinflated. Normal heart size. Chronic aortic tortuosity. IMPRESSION: 1. Mild hazy density at the left base favoring atelectasis. Superimposed ovoid lucency is likely a lung cyst seen on 2014 abdominal CT. Consider lateral view. 2. COPD and stable right apical scar-like opacity.  Electronically Signed   By: Monte Fantasia M.D.   On: 08/28/2016 11:49    Garrett Schone, DO  [facility] Pager [phone]  If 7PM-7AM, please contact night-coverage [email or web address] Password [email or web address] 09/02/2016, 9:20 AM   LOS: 5 days

## 2016-09-02 NOTE — Clinical Social Work Note (Signed)
Clinical Social Work Assessment  Patient Details  Name: Garrett Spencer MRN: [id number] Date of Birth: 1945/07/01  Date of referral:  09/02/16               Reason for consult:  Discharge Planning                Permission sought to share information with:    Permission granted to share information::     Name::        Agency::     Relationship::     Contact Information:     Housing/Transportation Living arrangements for the past 2 months:  [place] of Information:  Facility Patient Interpreter Needed:  None Criminal Activity/Legal Involvement Pertinent to Current Situation/Hospitalization:  No - Comment as needed Significant Relationships:  Siblings Lives with:  Facility Resident Do you feel safe going back to the place where you live?  Yes Need for family participation in patient care:  No (Coment)  Care giving concerns:  None reported. Pt is resident at [facility].    Social Worker assessment / plan:  CSW met with pt at bedside. Pt reports he has been a resident at [facility]. He states his brother is involved and supportive. Per Larene Beach at facility, pt has been at [facility] for about a month. He was at a group home prior to that, but plans to be long term at [facility]. Anticipate d/c tomorrow. Facility aware.   Employment status:  Retired Forensic scientist:  Medicare PT Recommendations:  Not assessed at this time [place] / Referral to community resources:  Other (Comment Required) (Return to SNF.)  Patient/Family's Response to care:  Pt requests to return to [facility] when medically stable.   Patient/Family's Understanding of and Emotional Response to Diagnosis, Current Treatment, and Prognosis:  Pt is aware of plan to d/c back to SNF soon, possibly tomorrow.   Emotional Assessment Appearance:  Appears stated age Attitude/Demeanor/Rapport:  Other (Cooperative) Affect (typically observed):  Accepting Orientation:  Oriented to Self,  Oriented to Situation, Oriented to Place Alcohol / Substance use:  Not Applicable Psych involvement (Current and /or in the community):  No (Comment)  Discharge Needs  Concerns to be addressed:  Discharge Planning Concerns Readmission within the last 30 days:  No Current discharge risk:  None Barriers to Discharge:  Continued Medical Work up   Salome Arnt, [place] 09/02/2016, 3:46 PM [phone]

## 2016-09-03 DIAGNOSIS — E440 Moderate protein-calorie malnutrition: Secondary | ICD-10-CM | POA: Diagnosis not present

## 2016-09-03 DIAGNOSIS — J449 Chronic obstructive pulmonary disease, unspecified: Secondary | ICD-10-CM | POA: Diagnosis present

## 2016-09-03 DIAGNOSIS — J189 Pneumonia, unspecified organism: Secondary | ICD-10-CM | POA: Diagnosis not present

## 2016-09-03 DIAGNOSIS — E785 Hyperlipidemia, unspecified: Secondary | ICD-10-CM | POA: Diagnosis not present

## 2016-09-03 DIAGNOSIS — J9601 Acute respiratory failure with hypoxia: Secondary | ICD-10-CM | POA: Diagnosis not present

## 2016-09-03 DIAGNOSIS — R748 Abnormal levels of other serum enzymes: Secondary | ICD-10-CM | POA: Diagnosis not present

## 2016-09-03 DIAGNOSIS — R0600 Dyspnea, unspecified: Secondary | ICD-10-CM | POA: Diagnosis not present

## 2016-09-03 DIAGNOSIS — R41841 Cognitive communication deficit: Secondary | ICD-10-CM | POA: Diagnosis not present

## 2016-09-03 DIAGNOSIS — F419 Anxiety disorder, unspecified: Secondary | ICD-10-CM | POA: Diagnosis not present

## 2016-09-03 DIAGNOSIS — F1320 Sedative, hypnotic or anxiolytic dependence, uncomplicated: Secondary | ICD-10-CM | POA: Diagnosis present

## 2016-09-03 DIAGNOSIS — E1143 Type 2 diabetes mellitus with diabetic autonomic (poly)neuropathy: Secondary | ICD-10-CM | POA: Diagnosis not present

## 2016-09-03 DIAGNOSIS — Z66 Do not resuscitate: Secondary | ICD-10-CM | POA: Diagnosis present

## 2016-09-03 DIAGNOSIS — M87052 Idiopathic aseptic necrosis of left femur: Secondary | ICD-10-CM | POA: Diagnosis not present

## 2016-09-03 DIAGNOSIS — G4700 Insomnia, unspecified: Secondary | ICD-10-CM | POA: Diagnosis not present

## 2016-09-03 DIAGNOSIS — I480 Paroxysmal atrial fibrillation: Secondary | ICD-10-CM | POA: Diagnosis not present

## 2016-09-03 DIAGNOSIS — I950 Idiopathic hypotension: Secondary | ICD-10-CM | POA: Diagnosis not present

## 2016-09-03 DIAGNOSIS — E559 Vitamin D deficiency, unspecified: Secondary | ICD-10-CM | POA: Diagnosis not present

## 2016-09-03 DIAGNOSIS — G629 Polyneuropathy, unspecified: Secondary | ICD-10-CM | POA: Diagnosis not present

## 2016-09-03 DIAGNOSIS — J9600 Acute respiratory failure, unspecified whether with hypoxia or hypercapnia: Secondary | ICD-10-CM | POA: Diagnosis not present

## 2016-09-03 DIAGNOSIS — J9602 Acute respiratory failure with hypercapnia: Secondary | ICD-10-CM | POA: Diagnosis not present

## 2016-09-03 DIAGNOSIS — F319 Bipolar disorder, unspecified: Secondary | ICD-10-CM | POA: Diagnosis not present

## 2016-09-03 DIAGNOSIS — K219 Gastro-esophageal reflux disease without esophagitis: Secondary | ICD-10-CM | POA: Diagnosis not present

## 2016-09-03 DIAGNOSIS — M87051 Idiopathic aseptic necrosis of right femur: Secondary | ICD-10-CM | POA: Diagnosis not present

## 2016-09-03 DIAGNOSIS — I4892 Unspecified atrial flutter: Secondary | ICD-10-CM | POA: Diagnosis not present

## 2016-09-03 DIAGNOSIS — R0682 Tachypnea, not elsewhere classified: Secondary | ICD-10-CM | POA: Diagnosis not present

## 2016-09-03 DIAGNOSIS — A419 Sepsis, unspecified organism: Secondary | ICD-10-CM | POA: Diagnosis present

## 2016-09-03 DIAGNOSIS — N179 Acute kidney failure, unspecified: Secondary | ICD-10-CM | POA: Diagnosis not present

## 2016-09-03 DIAGNOSIS — K5900 Constipation, unspecified: Secondary | ICD-10-CM | POA: Diagnosis not present

## 2016-09-03 DIAGNOSIS — Z515 Encounter for palliative care: Secondary | ICD-10-CM | POA: Diagnosis present

## 2016-09-03 DIAGNOSIS — G9341 Metabolic encephalopathy: Secondary | ICD-10-CM | POA: Diagnosis present

## 2016-09-03 DIAGNOSIS — N401 Enlarged prostate with lower urinary tract symptoms: Secondary | ICD-10-CM | POA: Diagnosis not present

## 2016-09-03 DIAGNOSIS — J690 Pneumonitis due to inhalation of food and vomit: Secondary | ICD-10-CM | POA: Diagnosis not present

## 2016-09-03 DIAGNOSIS — M6281 Muscle weakness (generalized): Secondary | ICD-10-CM | POA: Diagnosis not present

## 2016-09-03 DIAGNOSIS — F418 Other specified anxiety disorders: Secondary | ICD-10-CM | POA: Diagnosis not present

## 2016-09-03 DIAGNOSIS — B1920 Unspecified viral hepatitis C without hepatic coma: Secondary | ICD-10-CM | POA: Diagnosis present

## 2016-09-03 DIAGNOSIS — R0789 Other chest pain: Secondary | ICD-10-CM | POA: Diagnosis not present

## 2016-09-03 DIAGNOSIS — R4020 Unspecified coma: Secondary | ICD-10-CM | POA: Diagnosis not present

## 2016-09-03 DIAGNOSIS — R279 Unspecified lack of coordination: Secondary | ICD-10-CM | POA: Diagnosis not present

## 2016-09-03 DIAGNOSIS — I959 Hypotension, unspecified: Secondary | ICD-10-CM | POA: Diagnosis not present

## 2016-09-03 DIAGNOSIS — T68XXXA Hypothermia, initial encounter: Secondary | ICD-10-CM | POA: Diagnosis not present

## 2016-09-03 DIAGNOSIS — Z4682 Encounter for fitting and adjustment of non-vascular catheter: Secondary | ICD-10-CM | POA: Diagnosis not present

## 2016-09-03 DIAGNOSIS — Z79899 Other long term (current) drug therapy: Secondary | ICD-10-CM | POA: Diagnosis not present

## 2016-09-03 DIAGNOSIS — J441 Chronic obstructive pulmonary disease with (acute) exacerbation: Secondary | ICD-10-CM | POA: Diagnosis not present

## 2016-09-03 DIAGNOSIS — M87852 Other osteonecrosis, left femur: Secondary | ICD-10-CM | POA: Diagnosis present

## 2016-09-03 DIAGNOSIS — Z7401 Bed confinement status: Secondary | ICD-10-CM | POA: Diagnosis not present

## 2016-09-03 DIAGNOSIS — F329 Major depressive disorder, single episode, unspecified: Secondary | ICD-10-CM | POA: Diagnosis present

## 2016-09-03 DIAGNOSIS — R404 Transient alteration of awareness: Secondary | ICD-10-CM | POA: Diagnosis not present

## 2016-09-03 DIAGNOSIS — E43 Unspecified severe protein-calorie malnutrition: Secondary | ICD-10-CM | POA: Diagnosis present

## 2016-09-03 DIAGNOSIS — R531 Weakness: Secondary | ICD-10-CM | POA: Diagnosis not present

## 2016-09-03 DIAGNOSIS — F4489 Other dissociative and conversion disorders: Secondary | ICD-10-CM | POA: Diagnosis not present

## 2016-09-03 DIAGNOSIS — I10 Essential (primary) hypertension: Secondary | ICD-10-CM | POA: Diagnosis not present

## 2016-09-03 DIAGNOSIS — M87851 Other osteonecrosis, right femur: Secondary | ICD-10-CM | POA: Diagnosis present

## 2016-09-03 DIAGNOSIS — F3289 Other specified depressive episodes: Secondary | ICD-10-CM | POA: Diagnosis not present

## 2016-09-03 DIAGNOSIS — K449 Diaphragmatic hernia without obstruction or gangrene: Secondary | ICD-10-CM | POA: Diagnosis not present

## 2016-09-03 DIAGNOSIS — E1142 Type 2 diabetes mellitus with diabetic polyneuropathy: Secondary | ICD-10-CM | POA: Diagnosis present

## 2016-09-03 DIAGNOSIS — R6520 Severe sepsis without septic shock: Secondary | ICD-10-CM | POA: Diagnosis present

## 2016-09-03 DIAGNOSIS — Z86711 Personal history of pulmonary embolism: Secondary | ICD-10-CM | POA: Diagnosis not present

## 2016-09-03 DIAGNOSIS — I471 Supraventricular tachycardia: Secondary | ICD-10-CM | POA: Diagnosis not present

## 2016-09-03 DIAGNOSIS — E1165 Type 2 diabetes mellitus with hyperglycemia: Secondary | ICD-10-CM | POA: Diagnosis present

## 2016-09-03 LAB — GLUCOSE, CAPILLARY
GLUCOSE-CAPILLARY: 92 mg/dL (ref 65–99)
GLUCOSE-CAPILLARY: 136 mg/dL — AB (ref 65–99)
Glucose-Capillary: 89 mg/dL (ref 65–99)

## 2016-09-03 MED ORDER — METOPROLOL TARTRATE 25 MG PO TABS: 25.0000 mg | ORAL_TABLET | Freq: Two times a day (BID) | ORAL | 1 refills | Status: AC

## 2016-09-03 MED ORDER — LORAZEPAM 1 MG PO TABS: 1.0000 mg | ORAL_TABLET | Freq: Three times a day (TID) | ORAL | 0 refills | Status: AC | PRN

## 2016-09-03 MED ORDER — DILTIAZEM HCL ER COATED BEADS 240 MG PO CP24: 240.0000 mg | ORAL_CAPSULE | Freq: Every day | ORAL | 1 refills | Status: AC

## 2016-09-03 MED ORDER — CLONAZEPAM 2 MG PO TABS: 2.0000 mg | ORAL_TABLET | Freq: Two times a day (BID) | ORAL | 0 refills | Status: AC

## 2016-09-03 MED ORDER — TRAMADOL HCL 50 MG PO TABS: 50.0000 mg | ORAL_TABLET | Freq: Four times a day (QID) | ORAL | 0 refills | Status: AC

## 2016-09-03 MED ORDER — ASPIRIN 81 MG PO CHEW: 81.0000 mg | CHEWABLE_TABLET | Freq: Every day | ORAL | 11 refills | Status: AC

## 2016-09-03 MED ORDER — PREDNISONE 20 MG PO TABS: 60.0000 mg | ORAL_TABLET | Freq: Every day | ORAL | 0 refills | Status: AC

## 2016-09-03 NOTE — Progress Notes (Signed)
Report called to Prentice Docker at [facility].

## 2016-09-03 NOTE — Clinical Social Work Note (Signed)
LCSW spoke with Hoyle Sauer at [facility] and advised of patient's discharge. Hoyle Sauer advised that the facility can pick patient up at around 5:30.   LCSW left a message for Chet Lundeen advising of discharge of relative.     LCSW signing off.     Tieler Cournoyer, Clydene Pugh, LCSW

## 2016-09-03 NOTE — Progress Notes (Signed)
[facility] here to transport patient back, report called earlier

## 2016-09-03 NOTE — Discharge Summary (Signed)
Physician Discharge Summary  Garrett Spencer OZD:[id number] DOB: 1945/08/29 DOA: 08/28/2016  PCP: Neale Burly, MD  Admit date: 08/28/2016 Discharge date: 09/03/2016  Admitted From: [facility] Disposition:  [facility]  Recommendations for Outpatient Follow-up:  1. Follow up with PCP in 1-2 weeks 2. Please obtain BMP/CBC in one week 3. Maintain patient on 3 L nasal cannula and wean for oxygen saturation > 92%    Discharge Condition: Stable CODE STATUS: FULL Diet recommendation: Heart Healthy    Brief/Interim Summary: 71 y.o.malewith medical history ofbipolar disorder, COPD, diabetes mellitus, hypertension, hyperlipidemia, remote PE, and tremor presents with shortness of breath, coughing, and wheezing for "couple days". The patient states that he stopped smoking 3 years ago after smoking 1-1/2 packs per day 45 years. He denies any fevers, chills, chest pain, nausea, vomiting, diarrhea, vomiting, dysuria, hematuria, headache, neck pain. He has noted increasing generalized weakness and fatigue for the past couple days as well as a cough with yellow sputum. He denies any hemoptysis. He denies any orthopnea or worsening lower extremity edema. Upon EMS arrival, the patient was noted to be hypotensive with blood pressure of 81/60 with oxygen saturation 89% on room air. Blood cultures were obtained, the patient was started on empiric antibiotics. He was fluid resuscitated with improvement of his blood pressure. The patient was placed on bronchodilators and Solu-Medrol with improvement of his respiratory status. Unfortunately, his improvement was slow which was partly affected by the patient's atrial tachycardia/SVT and has anxiety. The patient's when necessary Ativan was increased to every 8 hours. He will continue on his scheduled dose of clonazepam. The patient was started on Cardizem CD ultimately with metoprolol tartrate to control his SVT.  Discharge Diagnoses:   Acute  respiratory failure with hypoxia  -Secondary to COPD exacerbation  -Start duo nebs--pt had been refusing--discussed importance of duonebs with pt -ContinuePulmicort  -presently stable on 3L -The patient developed respiratory distress on 08/29/2016 and 08/30/2016 -Transferedto stepdown unit 5/25 due to respiratory distress and SVT -08/30/2013--developed respiratory distress and tachycardia up to the 140s -5/25 CXR--personally reviewed--no infiltrates, no edema -increase lorazepam to 1 mg q 8 hrs prn anxiety  SVT/Atrial tachycardia -EKG atrial tachycardia, ?MAT -increase cardizem CD to 240 mg daily -add metoprolol tartrate 25 mg bid -His tachycardia arrhythmia as a function of his COPD and respiratory compromise -09/01/16 still having intermittent runs of atrial tachycardia but less frequent -Echo--results pending -TSH 0.496 -check mag--1.9  COPD exacerbation -Remains on 2 L nasal cannula -Remains dyspneic with minimal exertion -Continue IV Solu-Medrol--decreaseto q 12hours-->wean to prednisone-->d/c with taper -Continue bronchodilators -Continue Pulmicort-increase to 0.5 mg bid -added brovana -restart symbicort and incruse ellipta after d/c  Hypotension-->improved -holding BB, losartan/HCTZ initially -BB restarted; will not restart losartan/HCTZ at d/c -continue IVF-->improved -lactic acid 1.3-->1.8 -check procalcitonin--0.17 -blood cultures x 2 sets--neg to date -continue empiric cefepime pending culture data; d/c vancomycin -finish 5 days cefepime  AKI -Previous baseline creatinine 1.3 -admission serum creatinine 1.70 -serum creatinine 1.09 at time of d/c -IV fluids-->improved  Diabetes mellitus type 2 -Holding metformin-->restart after discharge -NovoLog sliding scale -A1C--5.7  Atypical chest pain -order EKG--no ST-T changes -cycle troponins--unremarkable  Hyperlipidemia -restart statin -start ASA 81 mg daily   Discharge  Instructions   Allergies as of 09/03/2016      Reactions   Benadryl [diphenhydramine]    unknown   Haldol [haloperidol] Other (See Comments)   unknown   Shellfish Allergy Other (See Comments)   unknown      Medication  List    STOP taking these medications   carvedilol 3.125 MG tablet Commonly known as:  COREG   losartan-hydrochlorothiazide 50-12.5 MG tablet Commonly known as:  HYZAAR     TAKE these medications   albuterol (2.5 MG/3ML) 0.083% nebulizer solution Commonly known as:  PROVENTIL Take 2.5 mg by nebulization 4 (four) times daily.   ARIPiprazole 5 MG tablet Commonly known as:  ABILIFY Take 5 mg by mouth daily.   aspirin 81 MG chewable tablet Chew 1 tablet (81 mg total) by mouth daily. Start taking on:  09/04/2016   atorvastatin 10 MG tablet Commonly known as:  LIPITOR Take 10 mg by mouth daily.   baclofen 10 MG tablet Commonly known as:  LIORESAL Take 5 mg by mouth 3 (three) times daily.   calcium-vitamin D 500-200 MG-UNIT tablet Commonly known as:  OSCAL WITH D Take 1 tablet by mouth daily with breakfast.   clonazePAM 2 MG tablet Commonly known as:  KLONOPIN Take 2 mg by mouth 2 (two) times daily. What changed:  Another medication with the same name was added. Make sure you understand how and when to take each.   clonazePAM 2 MG tablet Commonly known as:  KLONOPIN Take 1 tablet (2 mg total) by mouth 2 (two) times daily. What changed:  You were already taking a medication with the same name, and this prescription was added. Make sure you understand how and when to take each.   diltiazem 240 MG 24 hr capsule Commonly known as:  CARDIZEM CD Take 1 capsule (240 mg total) by mouth daily. Start taking on:  09/04/2016   ferrous sulfate 325 (65 FE) MG tablet Take 325 mg by mouth 2 (two) times daily with a meal.   finasteride 5 MG tablet Commonly known as:  PROSCAR Take 5 mg by mouth daily.   gabapentin 100 MG capsule Commonly known as:   NEURONTIN Take 1 capsule by mouth 3 (three) times daily.   INCRUSE ELLIPTA 62.5 MCG/INH Aepb Generic drug:  umeclidinium bromide Inhale 1 puff into the lungs daily.   isosorbide mononitrate 30 MG 24 hr tablet Commonly known as:  IMDUR Take 30 mg by mouth daily.   LORazepam 1 MG tablet Commonly known as:  ATIVAN Take 1 mg by mouth every 12 (twelve) hours as needed for anxiety. What changed:  Another medication with the same name was added. Make sure you understand how and when to take each.   LORazepam 1 MG tablet Commonly known as:  ATIVAN Take 1 tablet (1 mg total) by mouth every 8 (eight) hours as needed for anxiety. What changed:  You were already taking a medication with the same name, and this prescription was added. Make sure you understand how and when to take each.   metFORMIN 500 MG tablet Commonly known as:  GLUCOPHAGE Take 500 mg by mouth 2 (two) times daily with a meal.   metoprolol tartrate 25 MG tablet Commonly known as:  LOPRESSOR Take 1 tablet (25 mg total) by mouth 2 (two) times daily.   nitroGLYCERIN 0.4 MG SL tablet Commonly known as:  NITROSTAT Place 0.4 mg under the tongue every 5 (five) minutes as needed for chest pain.   predniSONE 20 MG tablet Commonly known as:  DELTASONE Take 3 tablets (60 mg total) by mouth daily with breakfast. And decrease by one tablet daily Start taking on:  09/04/2016   SYMBICORT 160-4.5 MCG/ACT inhaler Generic drug:  budesonide-formoterol Inhale 2 puffs into the lungs 2 (two) times  daily.   traMADol 50 MG tablet Commonly known as:  ULTRAM Take 50 mg by mouth 4 (four) times daily. What changed:  Another medication with the same name was added. Make sure you understand how and when to take each.   traMADol 50 MG tablet Commonly known as:  ULTRAM Take 1 tablet (50 mg total) by mouth 4 (four) times daily. What changed:  You were already taking a medication with the same name, and this prescription was added. Make sure you  understand how and when to take each.   traZODone 50 MG tablet Commonly known as:  DESYREL Take 50 mg by mouth at bedtime.   vitamin C 500 MG tablet Commonly known as:  ASCORBIC ACID Take 500 mg by mouth 2 (two) times daily.       Allergies  Allergen Reactions  . Benadryl [Diphenhydramine]     unknown  . Haldol [Haloperidol] Other (See Comments)    unknown  . Shellfish Allergy Other (See Comments)    unknown    Consultations:  none   Procedures/Studies: Dg Chest Port 1 View  Result Date: 08/30/2016 CLINICAL DATA:  71 y/o  M; respiratory distress.  History of COPD. EXAM: PORTABLE CHEST 1 VIEW COMPARISON:  08/29/2016 chest radiograph. FINDINGS: The heart size and mediastinal contours are within normal limits. Stable scarring in the right lung apex. No focal consolidation, effusion, or pneumothorax identified. Right humeral head subchondral sclerosis is stable and compatible with avascular necrosis. IMPRESSION: No active disease. Electronically Signed   By: Kristine Garbe M.D.   On: 08/30/2016 14:38   Dg Chest Port 1 View  Result Date: 08/29/2016 CLINICAL DATA:  Restrained distress.  COPD. EXAM: PORTABLE CHEST 1 VIEW COMPARISON:  08/28/2016 FINDINGS: There is no focal parenchymal opacity. There is no pleural effusion or pneumothorax. The heart and mediastinal contours are unremarkable. Subchondral sclerosis in the superior right humeral head concerning for avascular necrosis without articular surface collapse. IMPRESSION: No active disease. Electronically Signed   By: Kathreen Devoid   On: 08/29/2016 12:19   Dg Chest Portable 1 View  Result Date: 08/28/2016 CLINICAL DATA:  Increased congestion and fatigue. EXAM: PORTABLE CHEST 1 VIEW COMPARISON:  [facility] from [facility] had [facility]/2014. Abdominal CT 01/23/2013. FINDINGS: Right apical predominately linear opacity is stable from prior and likely scarring. There is a 13 mm lucency with neighboring hazy density at the  left base which likely reflects a cyst seen on 2014 abdominal CT. The lungs are hyperinflated. Normal heart size. Chronic aortic tortuosity. IMPRESSION: 1. Mild hazy density at the left base favoring atelectasis. Superimposed ovoid lucency is likely a lung cyst seen on 2014 abdominal CT. Consider lateral view. 2. COPD and stable right apical scar-like opacity. Electronically Signed   By: Monte Fantasia M.D.   On: 08/28/2016 11:49        Discharge Exam: Vitals:   09/02/16 2100 09/03/16 0456  BP: 125/79 122/82  Pulse: 89 62  Resp: (!) 23 20  Temp:  97.9 F (36.6 C)   Vitals:   09/03/16 0456 09/03/16 0815 09/03/16 0823 09/03/16 0943  BP: 122/82     Pulse: 62     Resp: 20     Temp: 97.9 F (36.6 C)     TempSrc: Oral     SpO2: 98% 96% 98% 97%  Weight:      Height:        General: Pt is alert, awake, not in acute distress Cardiovascular: RRR, S1/S2 +, no  rubs, no gallops Respiratory: CTA bilaterally, no wheezing, no rhonchi Abdominal: Soft, NT, ND, bowel sounds + Extremities: no edema, no cyanosis   The results of significant diagnostics from this hospitalization (including imaging, microbiology, ancillary and laboratory) are listed below for reference.    Significant Diagnostic Studies: Dg Chest Port 1 View  Result Date: 08/30/2016 CLINICAL DATA:  71 y/o  M; respiratory distress.  History of COPD. EXAM: PORTABLE CHEST 1 VIEW COMPARISON:  08/29/2016 chest radiograph. FINDINGS: The heart size and mediastinal contours are within normal limits. Stable scarring in the right lung apex. No focal consolidation, effusion, or pneumothorax identified. Right humeral head subchondral sclerosis is stable and compatible with avascular necrosis. IMPRESSION: No active disease. Electronically Signed   By: Kristine Garbe M.D.   On: 08/30/2016 14:38   Dg Chest Port 1 View  Result Date: 08/29/2016 CLINICAL DATA:  Restrained distress.  COPD. EXAM: PORTABLE CHEST 1 VIEW COMPARISON:   08/28/2016 FINDINGS: There is no focal parenchymal opacity. There is no pleural effusion or pneumothorax. The heart and mediastinal contours are unremarkable. Subchondral sclerosis in the superior right humeral head concerning for avascular necrosis without articular surface collapse. IMPRESSION: No active disease. Electronically Signed   By: Kathreen Devoid   On: 08/29/2016 12:19   Dg Chest Portable 1 View  Result Date: 08/28/2016 CLINICAL DATA:  Increased congestion and fatigue. EXAM: PORTABLE CHEST 1 VIEW COMPARISON:  Chest x-ray from more had [facility] 08/08/2014. Abdominal CT 01/23/2013. FINDINGS: Right apical predominately linear opacity is stable from prior and likely scarring. There is a 13 mm lucency with neighboring hazy density at the left base which likely reflects a cyst seen on 2014 abdominal CT. The lungs are hyperinflated. Normal heart size. Chronic aortic tortuosity. IMPRESSION: 1. Mild hazy density at the left base favoring atelectasis. Superimposed ovoid lucency is likely a lung cyst seen on 2014 abdominal CT. Consider lateral view. 2. COPD and stable right apical scar-like opacity. Electronically Signed   By: Monte Fantasia M.D.   On: 08/28/2016 11:49     Microbiology: Recent Results (from the past 240 hour(s))  Culture, blood (Routine X 2) w Reflex to ID Panel     Status: None   Collection Time: 08/28/16  3:07 PM  Result Value Ref Range Status   Specimen Description RIGHT ANTECUBITAL  Final   Special Requests   Final    BOTTLES DRAWN AEROBIC AND ANAEROBIC Blood Culture adequate volume   Culture NO GROWTH 5 DAYS  Final   Report Status 09/02/2016 FINAL  Final  Culture, blood (Routine X 2) w Reflex to ID Panel     Status: None   Collection Time: 08/28/16  3:12 PM  Result Value Ref Range Status   Specimen Description BLOOD RIGHT HAND  Final   Special Requests   Final    BOTTLES DRAWN AEROBIC AND ANAEROBIC Blood Culture adequate volume   Culture NO GROWTH 5 DAYS  Final   Report  Status 09/02/2016 FINAL  Final  MRSA PCR Screening     Status: None   Collection Time: 08/28/16  6:00 PM  Result Value Ref Range Status   MRSA by PCR NEGATIVE NEGATIVE Final    Comment:        The GeneXpert MRSA Assay (FDA approved for NASAL specimens only), is one component of a comprehensive MRSA colonization surveillance program. It is not intended to diagnose MRSA infection nor to guide or monitor treatment for MRSA infections.      Labs: Basic Metabolic  Panel:  Recent Labs Lab 08/29/16 0625 08/30/16 0544 08/31/16 0512 09/01/16 0409 09/02/16 0428  NA 140 138 140 139 142  K 4.1 4.3 3.8 3.9 4.0  CL 104 104 105 106 106  CO2 29 28 28 28 28   GLUCOSE 145* 137* 140* 152* 135*  BUN 37* 43* 41* 45* 47*  CREATININE 1.15 1.24 1.04 1.14 1.09  CALCIUM 8.6* 8.9 9.0 9.1 9.4  MG  --  1.8  --  1.9 1.9   Liver Function Tests: No results for input(s): AST, ALT, ALKPHOS, BILITOT, PROT, ALBUMIN in the last 168 hours. No results for input(s): LIPASE, AMYLASE in the last 168 hours. No results for input(s): AMMONIA in the last 168 hours. CBC:  Recent Labs Lab 08/28/16 1130 08/29/16 0625 08/31/16 0512  WBC 13.7* 9.0 9.8  NEUTROABS 10.9*  --   --   HGB 10.5* 8.7* 9.0*  HCT 33.6* 27.9* 28.5*  MCV 90.1 89.1 88.8  PLT 252 243 297   Cardiac Enzymes:  Recent Labs Lab 08/28/16 1130 08/29/16 0944 08/29/16 1506  TROPONINI 0.03* <0.03 0.03*   BNP: Invalid input(s): POCBNP CBG:  Recent Labs Lab 09/01/16 2106 09/02/16 0729 09/02/16 1126 09/02/16 1634 09/02/16 2115  GLUCAP 149* 133* 161* 99 122*    Time coordinating discharge:  Greater than 30 minutes  Signed:  Tamira Ryland, DO [facility] Pager: [phone] 09/03/2016, 10:23 AM

## 2016-09-03 NOTE — Care Management Note (Signed)
Case Management Note  Patient Details  Name: AYRTON MCVAY MRN: [id number] Date of Birth: [birth year]  Expected Discharge Date:  09/03/16               Expected Discharge Plan:  [place]  In-House Referral:  Clinical Social Work  Discharge planning Services  NA  Post Acute Care Choice:  NA Choice offered to:  NA  Status of Service:  Completed, signed off  If discussed at [place] of Stay Meetings, dates discussed:  09/03/2016  Additional Comments: Discharging to SNF today, CSW has made arrangements for return to facility. No CM needs.   Sherald Barge, RN 09/03/2016, 11:49 AM

## 2016-09-03 NOTE — Care Management Important Message (Signed)
Important Message  Patient Details  Name: GLENROY CROSSEN MRN: [id number] Date of Birth: 08/29/1945   Medicare Important Message Given:  Yes    Sherald Barge, RN 09/03/2016, 11:49 AM

## 2016-09-04 DIAGNOSIS — F319 Bipolar disorder, unspecified: Secondary | ICD-10-CM | POA: Diagnosis not present

## 2016-09-04 DIAGNOSIS — F419 Anxiety disorder, unspecified: Secondary | ICD-10-CM | POA: Diagnosis not present

## 2016-09-09 ENCOUNTER — Emergency Department (HOSPITAL_COMMUNITY): Payer: Medicare Other

## 2016-09-09 ENCOUNTER — Emergency Department (HOSPITAL_COMMUNITY)
Admission: EM | Admit: 2016-09-09 | Discharge: 2016-09-10 | Disposition: A | Discharge status: Home / Self Care | Payer: Medicare Other | Source: Home / Self Care | Attending: Emergency Medicine | Admitting: Emergency Medicine

## 2016-09-09 ENCOUNTER — Encounter (HOSPITAL_COMMUNITY): Payer: Self-pay | Admitting: *Deleted

## 2016-09-09 DIAGNOSIS — J9600 Acute respiratory failure, unspecified whether with hypoxia or hypercapnia: Secondary | ICD-10-CM | POA: Diagnosis not present

## 2016-09-09 DIAGNOSIS — I959 Hypotension, unspecified: Secondary | ICD-10-CM

## 2016-09-09 DIAGNOSIS — A419 Sepsis, unspecified organism: Secondary | ICD-10-CM | POA: Diagnosis not present

## 2016-09-09 DIAGNOSIS — I10 Essential (primary) hypertension: Secondary | ICD-10-CM | POA: Diagnosis not present

## 2016-09-09 DIAGNOSIS — J690 Pneumonitis due to inhalation of food and vomit: Secondary | ICD-10-CM | POA: Diagnosis not present

## 2016-09-09 DIAGNOSIS — Z4682 Encounter for fitting and adjustment of non-vascular catheter: Secondary | ICD-10-CM | POA: Diagnosis not present

## 2016-09-09 DIAGNOSIS — N179 Acute kidney failure, unspecified: Secondary | ICD-10-CM | POA: Diagnosis not present

## 2016-09-09 DIAGNOSIS — R0600 Dyspnea, unspecified: Secondary | ICD-10-CM | POA: Diagnosis not present

## 2016-09-09 DIAGNOSIS — I480 Paroxysmal atrial fibrillation: Secondary | ICD-10-CM | POA: Diagnosis not present

## 2016-09-09 DIAGNOSIS — R4020 Unspecified coma: Secondary | ICD-10-CM | POA: Diagnosis not present

## 2016-09-09 MED ORDER — ALBUTEROL SULFATE (2.5 MG/3ML) 0.083% IN NEBU
5.0000 mg | INHALATION_SOLUTION | Freq: Once | RESPIRATORY_TRACT | Status: AC
  Administered 2016-09-10: 5 mg via RESPIRATORY_TRACT
  Filled 2016-09-09: qty 6

## 2016-09-09 NOTE — ED Triage Notes (Addendum)
Pt brought in by [facility] for sob from [facility]; staff reported pt began having sob this evening; ems reports initial BP was 80/40; pt given 552ml bolus and BP up to 100's/60's; ems reports pt is more alert and able to answer questions appropriately; pt's only complaint is foot pain  Report from nursing home: pt started having sob at 10pm tonight and sats went from 90's to 81%, staff gave pt a neb treatment and his symbicort with no change in sats; pt's vitals at nursing home was BP 100/64, HR 100 T 97.7 and pt continued to c/o sob; pt was recently diagnosed and treated for pneumonia and given prednisone

## 2016-09-10 LAB — COMPREHENSIVE METABOLIC PANEL
ALT: 41 U/L (ref 17–63)
AST: 30 U/L (ref 15–41)
Albumin: 3.1 g/dL — ABNORMAL LOW (ref 3.5–5.0)
Alkaline Phosphatase: 54 U/L (ref 38–126)
Anion gap: 7 (ref 5–15)
BUN: 24 mg/dL — AB (ref 6–20)
CHLORIDE: 101 mmol/L (ref 101–111)
CO2: 30 mmol/L (ref 22–32)
CREATININE: 1.12 mg/dL (ref 0.61–1.24)
Calcium: 9.3 mg/dL (ref 8.9–10.3)
GFR calc Af Amer: >60 mL/min (ref >60)
GLUCOSE: 96 mg/dL (ref 65–99)
Potassium: 4 mmol/L (ref 3.5–5.1)
Sodium: 138 mmol/L (ref 135–145)
Total Bilirubin: 0.7 mg/dL (ref 0.3–1.2)
Total Protein: 5.3 g/dL — ABNORMAL LOW (ref 6.5–8.1)

## 2016-09-10 LAB — CBC WITH DIFFERENTIAL/PLATELET
Basophils Absolute: 0 10*3/uL (ref 0.0–0.1)
Basophils Relative: 0 %
EOS ABS: 0.2 10*3/uL (ref 0.0–0.7)
EOS PCT: 1 %
HCT: 27 % — ABNORMAL LOW (ref 39.0–52.0)
Hemoglobin: 8.5 g/dL — ABNORMAL LOW (ref 13.0–17.0)
Lymphocytes Relative: 6 %
Lymphs Abs: 1.2 10*3/uL (ref 0.7–4.0)
MCH: 28.2 pg (ref 26.0–34.0)
MCHC: 31.5 g/dL (ref 30.0–36.0)
MCV: 89.7 fL (ref 78.0–100.0)
MONO ABS: 1.2 10*3/uL — AB (ref 0.1–1.0)
MONOS PCT: 6 %
Neutro Abs: 17.9 10*3/uL — ABNORMAL HIGH (ref 1.7–7.7)
Neutrophils Relative %: 87 %
PLATELETS: 260 10*3/uL (ref 150–400)
RBC: 3.01 MIL/uL — AB (ref 4.22–5.81)
RDW: 16.1 % — AB (ref 11.5–15.5)
WBC: 20.5 10*3/uL — ABNORMAL HIGH (ref 4.0–10.5)

## 2016-09-10 LAB — TROPONIN I: Troponin I: <0.03 ng/mL (ref <0.03)

## 2016-09-10 LAB — LACTIC ACID, PLASMA: LACTIC ACID, VENOUS: 0.9 mmol/L (ref 0.5–1.9)

## 2016-09-10 NOTE — ED Notes (Signed)
Called and gave report to [facility] LPN who stated that he would arrange for transportation. But it would be a little later.

## 2016-09-10 NOTE — ED Notes (Signed)
Asst. Director calledf and stated that she would call EMS to have pt picked up since EMS had brought him there and their transportation would not be able to get him until about noon.

## 2016-09-10 NOTE — Discharge Instructions (Signed)
Garrett Spencer has had no complaints while in the ED. EMS reported an initial low blood pressure which responded to 500 cc of IV fluids.  His labs have been unrevealing except for an elevated WBC c/w being on steroids. His CXR shows  no acute changes. Monitor and recheck as needed.

## 2016-09-10 NOTE — ED Provider Notes (Signed)
[facility] DEPT Provider Note   CSN: [id number] Arrival date & time: 09/09/16  2312  Time seen 23:20 AM   History   Chief Complaint Chief Complaint  Patient presents with  . Shortness of Breath    HPI Garrett Spencer is a 71 y.o. male.  HPI  patient presents to the emergency department tonight from his nursing facility. They reported to EMS that patient started having shortness of breath at 10 PM and his saturations dropped from the 90s down to 81%. He was given a nebulizer treatment and his Symbicort with no change in his pulse ox. They report his low pressure was 100/64 heart rate 100 and he was afebrile. He continued to complain of shortness of breath. He had recently been diagnosed with pneumonia and was given prednisone. EMS reports on their arrival his blood pressure was 80/40 and he was given a 500 mL bolus of fluid his blood pressure improved to 100/60. They report patient was initially not very alert however he became alert after the IV fluids. When I talked to the patient he states "I feel chip or". He denies having any problems tonight. He does not seem to remember being short of breath. He denies any nausea or vomiting.  PCP Dr Vernell Morgans  Past Medical History:  Diagnosis Date  . Anxiety   . Arthritis   . Asthma   . Bipolar disorder ([place])   . BPH with obstruction/lower urinary tract symptoms   . Cancer ([place])   . Cognitive communication deficit   . Constipation   . COPD (chronic obstructive pulmonary disease) ([place])   . Depression   . Diabetes mellitus without complication ([place])   . Diaphragmatic hernia   . Generalized weakness   . GERD (gastroesophageal reflux disease)   . Hepatitis C   . History of pulmonary embolism   . History of shingles   . HOH (hard of hearing)   . Hyperlipidemia   . Hypertension   . Hypocalcemia   . Insomnia   . Pneumonia   . Polyneuropathy   . Torn rotator cuff    left  . Tremors of nervous system   . Vitamin D deficiency       Patient Active Problem List   Diagnosis Date Noted  . SVT (supraventricular tachycardia) ([place]) 08/30/2016  . Respiratory distress   . COPD exacerbation ([place]) 08/28/2016  . Hypotension 08/28/2016  . AKI (acute kidney injury) ([place]) 08/28/2016  . Acute respiratory failure with hypoxia ([place]) 08/28/2016  . COPD with acute exacerbation [facility])     Past Surgical History:  Procedure Laterality Date  . CATARACT EXTRACTION W/PHACO Left 12/19/2014   Procedure: CATARACT EXTRACTION PHACO AND INTRAOCULAR LENS PLACEMENT (IOC);  Surgeon: Tonny Branch, MD;  Location: [facility];  Service: Ophthalmology;  Laterality: Left;  CDE: 19.97  . CATARACT EXTRACTION W/PHACO Right 02/20/2015   Procedure: CATARACT EXTRACTION PHACO AND INTRAOCULAR LENS PLACEMENT RIGHT EYE;  Surgeon: Tonny Branch, MD;  Location: [facility];  Service: Ophthalmology;  Laterality: Right;  16.59  . ORIF FEMUR FRACTURE Left   . TONGUE SURGERY     removal of cancer  . TRANSURETHRAL RESECTION OF PROSTATE         Home Medications    Prior to Admission medications   Medication Sig Start Date End Date Taking? Authorizing Provider  albuterol (PROVENTIL) (2.5 MG/3ML) 0.083% nebulizer solution Take 2.5 mg by nebulization 4 (four) times daily.    [provider]  ARIPiprazole (ABILIFY) 5 MG tablet  Take 5 mg by mouth daily.    [provider]  aspirin 81 MG chewable tablet Chew 1 tablet (81 mg total) by mouth daily. 09/04/16   Orson Eva, MD  atorvastatin (LIPITOR) 10 MG tablet Take 10 mg by mouth daily.    [provider]  baclofen (LIORESAL) 10 MG tablet Take 5 mg by mouth 3 (three) times daily. 08/15/16   [provider]  budesonide-formoterol (SYMBICORT) 160-4.5 MCG/ACT inhaler Inhale 2 puffs into the lungs 2 (two) times daily.    [provider]  calcium-vitamin D (OSCAL WITH D) 500-200 MG-UNIT per tablet Take 1 tablet by mouth daily with breakfast.     [provider]  clonazePAM (KLONOPIN) 2  MG tablet Take 2 mg by mouth 2 (two) times daily.    [provider]  clonazePAM (KLONOPIN) 2 MG tablet Take 1 tablet (2 mg total) by mouth 2 (two) times daily. 09/03/16   Orson Eva, MD  diltiazem (CARDIZEM CD) 240 MG 24 hr capsule Take 1 capsule (240 mg total) by mouth daily. 09/04/16   Orson Eva, MD  ferrous sulfate 325 (65 FE) MG tablet Take 325 mg by mouth 2 (two) times daily with a meal.    [provider]  finasteride (PROSCAR) 5 MG tablet Take 5 mg by mouth daily.    [provider]  gabapentin (NEURONTIN) 100 MG capsule Take 1 capsule by mouth 3 (three) times daily. 08/15/16   [provider]  INCRUSE ELLIPTA 62.5 MCG/INH AEPB Inhale 1 puff into the lungs daily. 07/22/16   [provider]  isosorbide mononitrate (IMDUR) 30 MG 24 hr tablet Take 30 mg by mouth daily.    [provider]  LORazepam (ATIVAN) 1 MG tablet Take 1 mg by mouth every 12 (twelve) hours as needed for anxiety.    [provider]  LORazepam (ATIVAN) 1 MG tablet Take 1 tablet (1 mg total) by mouth every 8 (eight) hours as needed for anxiety. 09/03/16   Orson Eva, MD  metFORMIN (GLUCOPHAGE) 500 MG tablet Take 500 mg by mouth 2 (two) times daily with a meal.    [provider]  metoprolol tartrate (LOPRESSOR) 25 MG tablet Take 1 tablet (25 mg total) by mouth 2 (two) times daily. 09/03/16   Orson Eva, MD  nitroGLYCERIN (NITROSTAT) 0.4 MG SL tablet Place 0.4 mg under the tongue every 5 (five) minutes as needed for chest pain.    [provider]  predniSONE (DELTASONE) 20 MG tablet Take 3 tablets (60 mg total) by mouth daily with breakfast. And decrease by one tablet daily 09/04/16   Tat, Shanon Brow, MD  traMADol (ULTRAM) 50 MG tablet Take 50 mg by mouth 4 (four) times daily.     [provider]  traMADol (ULTRAM) 50 MG tablet Take 1 tablet (50 mg total) by mouth 4 (four) times daily. 09/03/16   Orson Eva, MD  traZODone (DESYREL) 50 MG tablet Take  50 mg by mouth at bedtime.    [provider]  vitamin C (ASCORBIC ACID) 500 MG tablet Take 500 mg by mouth 2 (two) times daily.    [provider]    Family History History reviewed. No pertinent family history.  Social History Social History  Substance Use Topics  . Smoking status: Former Smoker    Packs/day: 1.50    Years: 45.00    Types: Cigarettes    Quit date: 12/14/2012  . Smokeless tobacco: Never Used  . Alcohol use No  lives in NH   Allergies   Benadryl [diphenhydramine]; Haldol [haloperidol]; and Shellfish allergy   Review of Systems Review of Systems  All other systems reviewed and are negative.    Physical Exam Updated Vital Signs BP 106/67   Pulse 60   Temp 97.7 F (36.5 C) (Oral)   Resp 17   Ht 5\' 11"  (1.803 m)   Wt 67.6 kg (149 lb)   SpO2 100%   BMI 20.78 kg/m   Vital signs normal    Physical Exam  Constitutional: He is oriented to person, place, and time.  Non-toxic appearance. He does not appear ill. No distress.  Frail elderly patient  HENT:  Head: Normocephalic and atraumatic.  Right Ear: External ear normal.  Left Ear: External ear normal.  Nose: Nose normal. No mucosal edema or rhinorrhea.  Mouth/Throat: Oropharynx is clear and moist and mucous membranes are normal. No dental abscesses or uvula swelling.  Eyes: Conjunctivae and EOM are normal. Pupils are equal, round, and reactive to light.  Neck: Normal range of motion and full passive range of motion without pain. Neck supple.  Cardiovascular: Normal rate, regular rhythm and normal heart sounds.  Exam reveals no gallop and no friction rub.   No murmur heard. Pulmonary/Chest: Effort normal and breath sounds normal. No respiratory distress. He has no wheezes. He has no rhonchi. He has no rales. He exhibits no tenderness and no crepitus.  Abdominal: Soft. Normal appearance and bowel sounds are normal. He exhibits no distension. There is no tenderness. There is no rebound  and no guarding.  Musculoskeletal: Normal range of motion. He exhibits no edema or tenderness.  Moves all extremities well.   Neurological: He is alert and oriented to person, place, and time. He has normal strength. No cranial nerve deficit.  Skin: Skin is warm, dry and intact. No rash noted. No erythema. No pallor.  Patient has lots of bruising on his arms from recent IV sites  Psychiatric: He has a normal mood and affect. His speech is normal and behavior is normal. His mood appears not anxious.  Nursing note and vitals reviewed.    ED Treatments / Results  Labs (all labs ordered are listed, but only abnormal results are displayed) Results for orders placed or performed during the hospital encounter of 09/09/16  Comprehensive metabolic panel  Result Value Ref Range   Sodium 138 135 - 145 mmol/L   Potassium 4.0 3.5 - 5.1 mmol/L   Chloride 101 101 - 111 mmol/L   CO2 30 22 - 32 mmol/L   Glucose, Bld 96 65 - 99 mg/dL   BUN 24 (H) 6 - 20 mg/dL   Creatinine, Ser 1.12 0.61 - 1.24 mg/dL   Calcium 9.3 8.9 - 10.3 mg/dL   Total Protein 5.3 (L) 6.5 - 8.1 g/dL   Albumin 3.1 (L) 3.5 - 5.0 g/dL   AST 30 15 - 41 U/L   ALT 41 17 - 63 U/L   Alkaline Phosphatase 54 38 - 126 U/L   Total Bilirubin 0.7 0.3 - 1.2 mg/dL   GFR calc non Af Amer >60 >60 mL/min   GFR calc Af Amer >60 >60 mL/min   Anion gap 7 5 - 15  CBC with Differential  Result Value Ref Range   WBC 20.5 (H) 4.0 - 10.5 K/uL   RBC 3.01 (L) 4.22 - 5.81 MIL/uL   Hemoglobin 8.5 (L) 13.0 - 17.0 g/dL   HCT 27.0 (L) 39.0 - 52.0 %   MCV  89.7 78.0 - 100.0 fL   MCH 28.2 26.0 - 34.0 pg   MCHC 31.5 30.0 - 36.0 g/dL   RDW 16.1 (H) 11.5 - 15.5 %   Platelets 260 150 - 400 K/uL   Neutrophils Relative % 87 %   Neutro Abs 17.9 (H) 1.7 - 7.7 K/uL   Lymphocytes Relative 6 %   Lymphs Abs 1.2 0.7 - 4.0 K/uL   Monocytes Relative 6 %   Monocytes Absolute 1.2 (H) 0.1 - 1.0 K/uL   Eosinophils Relative 1 %   Eosinophils Absolute 0.2 0.0 - 0.7 K/uL     Basophils Relative 0 %   Basophils Absolute 0.0 0.0 - 0.1 K/uL  Lactic acid, plasma  Result Value Ref Range   Lactic Acid, Venous 0.9 0.5 - 1.9 mmol/L  Troponin I  Result Value Ref Range   Troponin I <0.03 <0.03 ng/mL   Laboratory interpretation all normal except Leukocytosis, on steroids, anemia, malnutrition    EKG  EKG Interpretation None      ED ECG REPORT   Date: 09/10/2016  Rate: 103  Rhythm: sinus tachycardia  QRS Axis: normal  Intervals: normal  ST/T Wave abnormalities: normal  Conduction Disutrbances:none  Narrative Interpretation: PAC's  Old EKG Reviewed: none available  I have personally reviewed the EKG tracing and agree with the computerized printout as noted.     Radiology Dg Chest 2 View  Result Date: 09/10/2016 CLINICAL DATA:  Dyspnea and hypotension EXAM: CHEST  2 VIEW COMPARISON:  08/30/2016 FINDINGS: The heart size and mediastinal contours are within normal limits. Aortic atherosclerosis at the arch without aneurysm. Scarring is again noted in the right upper lobe. No pneumonic consolidation, CHF nor effusion. No pneumothorax. Bowel subchondral sclerosis of the humeral heads consistent with changes of avascular necrosis. IMPRESSION: Chronic scarring in the right upper lobe. Aortic atherosclerosis. No acute pneumonic consolidation or CHF. 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 Electronically Signed   By: Ashley Royalty M.D.   On: 09/10/2016 00:11    Procedures Procedures (including critical care time)  Medications Ordered in ED Medications  albuterol (PROVENTIL) (2.5 MG/3ML) 0.083% nebulizer solution 5 mg (5 mg Nebulization Given 09/10/16 0013)     Initial Impression / Assessment and Plan / ED Course  I have reviewed the triage vital signs and the nursing notes.  Pertinent labs & imaging results that were available during my care of the patient were reviewed by me and considered in my medical decision making (see chart for  details).  At the time of my exam patient's blood pressure was 103/64, heart rate 59, and his pulse ox was in her percent on 2 L/m nasal cannula.  Patient is chronically on nasal cannula oxygen at 2 L/m nasal cannula.  Patient remained stable during his ED visit. He continued to not have any complaints. He did become hypotensive once while sleeping but his blood pressure 84/57. His initial lactic acid was normal. He was discharged back to his nursing facility.  Final Clinical Impressions(s) / ED Diagnoses   Final diagnoses:  Hypotension, unspecified hypotension type   Plan discharge  Rolland Porter, MD, Barbette Or, MD 09/10/16 [phone]

## 2016-09-11 ENCOUNTER — Encounter (HOSPITAL_COMMUNITY): Payer: Self-pay | Admitting: *Deleted

## 2016-09-11 ENCOUNTER — Emergency Department (HOSPITAL_COMMUNITY): Payer: Medicare Other

## 2016-09-11 ENCOUNTER — Inpatient Hospital Stay (HOSPITAL_COMMUNITY)
Admission: EM | Admit: 2016-09-11 | Discharge: 2016-09-19 | DRG: 870 | Disposition: A | Discharge status: Home / Self Care | Payer: Medicare Other | Attending: Pulmonary Disease | Admitting: Pulmonary Disease

## 2016-09-11 DIAGNOSIS — J189 Pneumonia, unspecified organism: Secondary | ICD-10-CM

## 2016-09-11 DIAGNOSIS — R001 Bradycardia, unspecified: Secondary | ICD-10-CM | POA: Diagnosis present

## 2016-09-11 DIAGNOSIS — E1165 Type 2 diabetes mellitus with hyperglycemia: Secondary | ICD-10-CM | POA: Diagnosis present

## 2016-09-11 DIAGNOSIS — R109 Unspecified abdominal pain: Secondary | ICD-10-CM

## 2016-09-11 DIAGNOSIS — N179 Acute kidney failure, unspecified: Secondary | ICD-10-CM | POA: Diagnosis not present

## 2016-09-11 DIAGNOSIS — Z515 Encounter for palliative care: Secondary | ICD-10-CM | POA: Diagnosis present

## 2016-09-11 DIAGNOSIS — M87852 Other osteonecrosis, left femur: Secondary | ICD-10-CM | POA: Diagnosis present

## 2016-09-11 DIAGNOSIS — M87051 Idiopathic aseptic necrosis of right femur: Secondary | ICD-10-CM | POA: Diagnosis present

## 2016-09-11 DIAGNOSIS — M87052 Idiopathic aseptic necrosis of left femur: Secondary | ICD-10-CM

## 2016-09-11 DIAGNOSIS — R6520 Severe sepsis without septic shock: Secondary | ICD-10-CM | POA: Diagnosis not present

## 2016-09-11 DIAGNOSIS — F419 Anxiety disorder, unspecified: Secondary | ICD-10-CM | POA: Diagnosis present

## 2016-09-11 DIAGNOSIS — H9190 Unspecified hearing loss, unspecified ear: Secondary | ICD-10-CM | POA: Diagnosis present

## 2016-09-11 DIAGNOSIS — I4892 Unspecified atrial flutter: Secondary | ICD-10-CM | POA: Diagnosis present

## 2016-09-11 DIAGNOSIS — Z9079 Acquired absence of other genital organ(s): Secondary | ICD-10-CM

## 2016-09-11 DIAGNOSIS — Z87891 Personal history of nicotine dependence: Secondary | ICD-10-CM

## 2016-09-11 DIAGNOSIS — Z888 Allergy status to other drugs, medicaments and biological substances status: Secondary | ICD-10-CM

## 2016-09-11 DIAGNOSIS — T68XXXA Hypothermia, initial encounter: Secondary | ICD-10-CM

## 2016-09-11 DIAGNOSIS — Z452 Encounter for adjustment and management of vascular access device: Secondary | ICD-10-CM | POA: Diagnosis not present

## 2016-09-11 DIAGNOSIS — I480 Paroxysmal atrial fibrillation: Secondary | ICD-10-CM | POA: Diagnosis present

## 2016-09-11 DIAGNOSIS — M87851 Other osteonecrosis, right femur: Secondary | ICD-10-CM | POA: Diagnosis present

## 2016-09-11 DIAGNOSIS — J9690 Respiratory failure, unspecified, unspecified whether with hypoxia or hypercapnia: Secondary | ICD-10-CM | POA: Diagnosis not present

## 2016-09-11 DIAGNOSIS — E43 Unspecified severe protein-calorie malnutrition: Secondary | ICD-10-CM | POA: Diagnosis present

## 2016-09-11 DIAGNOSIS — B1920 Unspecified viral hepatitis C without hepatic coma: Secondary | ICD-10-CM | POA: Diagnosis present

## 2016-09-11 DIAGNOSIS — E1142 Type 2 diabetes mellitus with diabetic polyneuropathy: Secondary | ICD-10-CM | POA: Diagnosis present

## 2016-09-11 DIAGNOSIS — R7989 Other specified abnormal findings of blood chemistry: Secondary | ICD-10-CM

## 2016-09-11 DIAGNOSIS — R4020 Unspecified coma: Secondary | ICD-10-CM | POA: Diagnosis not present

## 2016-09-11 DIAGNOSIS — Z7982 Long term (current) use of aspirin: Secondary | ICD-10-CM

## 2016-09-11 DIAGNOSIS — Z961 Presence of intraocular lens: Secondary | ICD-10-CM | POA: Diagnosis present

## 2016-09-11 DIAGNOSIS — J9602 Acute respiratory failure with hypercapnia: Secondary | ICD-10-CM | POA: Diagnosis not present

## 2016-09-11 DIAGNOSIS — Z85810 Personal history of malignant neoplasm of tongue: Secondary | ICD-10-CM

## 2016-09-11 DIAGNOSIS — Z79891 Long term (current) use of opiate analgesic: Secondary | ICD-10-CM

## 2016-09-11 DIAGNOSIS — R748 Abnormal levels of other serum enzymes: Secondary | ICD-10-CM | POA: Diagnosis not present

## 2016-09-11 DIAGNOSIS — R41841 Cognitive communication deficit: Secondary | ICD-10-CM | POA: Diagnosis present

## 2016-09-11 DIAGNOSIS — N400 Enlarged prostate without lower urinary tract symptoms: Secondary | ICD-10-CM | POA: Diagnosis present

## 2016-09-11 DIAGNOSIS — J9601 Acute respiratory failure with hypoxia: Secondary | ICD-10-CM | POA: Diagnosis present

## 2016-09-11 DIAGNOSIS — F1320 Sedative, hypnotic or anxiolytic dependence, uncomplicated: Secondary | ICD-10-CM | POA: Diagnosis present

## 2016-09-11 DIAGNOSIS — R0602 Shortness of breath: Secondary | ICD-10-CM | POA: Diagnosis not present

## 2016-09-11 DIAGNOSIS — R778 Other specified abnormalities of plasma proteins: Secondary | ICD-10-CM

## 2016-09-11 DIAGNOSIS — E119 Type 2 diabetes mellitus without complications: Secondary | ICD-10-CM

## 2016-09-11 DIAGNOSIS — Z7951 Long term (current) use of inhaled steroids: Secondary | ICD-10-CM

## 2016-09-11 DIAGNOSIS — I2510 Atherosclerotic heart disease of native coronary artery without angina pectoris: Secondary | ICD-10-CM | POA: Diagnosis present

## 2016-09-11 DIAGNOSIS — E440 Moderate protein-calorie malnutrition: Secondary | ICD-10-CM | POA: Insufficient documentation

## 2016-09-11 DIAGNOSIS — I10 Essential (primary) hypertension: Secondary | ICD-10-CM | POA: Diagnosis present

## 2016-09-11 DIAGNOSIS — A419 Sepsis, unspecified organism: Principal | ICD-10-CM | POA: Diagnosis present

## 2016-09-11 DIAGNOSIS — Z91013 Allergy to seafood: Secondary | ICD-10-CM

## 2016-09-11 DIAGNOSIS — R404 Transient alteration of awareness: Secondary | ICD-10-CM | POA: Diagnosis not present

## 2016-09-11 DIAGNOSIS — K449 Diaphragmatic hernia without obstruction or gangrene: Secondary | ICD-10-CM | POA: Diagnosis present

## 2016-09-11 DIAGNOSIS — F319 Bipolar disorder, unspecified: Secondary | ICD-10-CM | POA: Diagnosis present

## 2016-09-11 DIAGNOSIS — R451 Restlessness and agitation: Secondary | ICD-10-CM | POA: Diagnosis present

## 2016-09-11 DIAGNOSIS — Z7984 Long term (current) use of oral hypoglycemic drugs: Secondary | ICD-10-CM

## 2016-09-11 DIAGNOSIS — J9600 Acute respiratory failure, unspecified whether with hypoxia or hypercapnia: Secondary | ICD-10-CM

## 2016-09-11 DIAGNOSIS — G9341 Metabolic encephalopathy: Secondary | ICD-10-CM | POA: Diagnosis present

## 2016-09-11 DIAGNOSIS — I959 Hypotension, unspecified: Secondary | ICD-10-CM | POA: Diagnosis present

## 2016-09-11 DIAGNOSIS — Z0189 Encounter for other specified special examinations: Secondary | ICD-10-CM

## 2016-09-11 DIAGNOSIS — Y95 Nosocomial condition: Secondary | ICD-10-CM

## 2016-09-11 DIAGNOSIS — F329 Major depressive disorder, single episode, unspecified: Secondary | ICD-10-CM | POA: Diagnosis present

## 2016-09-11 DIAGNOSIS — J690 Pneumonitis due to inhalation of food and vomit: Secondary | ICD-10-CM | POA: Diagnosis present

## 2016-09-11 DIAGNOSIS — Z6820 Body mass index (BMI) 20.0-20.9, adult: Secondary | ICD-10-CM

## 2016-09-11 DIAGNOSIS — K219 Gastro-esophageal reflux disease without esophagitis: Secondary | ICD-10-CM | POA: Diagnosis present

## 2016-09-11 DIAGNOSIS — J449 Chronic obstructive pulmonary disease, unspecified: Secondary | ICD-10-CM | POA: Diagnosis present

## 2016-09-11 DIAGNOSIS — Z79899 Other long term (current) drug therapy: Secondary | ICD-10-CM

## 2016-09-11 DIAGNOSIS — R34 Anuria and oliguria: Secondary | ICD-10-CM | POA: Diagnosis present

## 2016-09-11 DIAGNOSIS — R402431 Glasgow coma scale score 3-8, in the field [EMT or ambulance]: Secondary | ICD-10-CM

## 2016-09-11 DIAGNOSIS — E559 Vitamin D deficiency, unspecified: Secondary | ICD-10-CM | POA: Diagnosis present

## 2016-09-11 DIAGNOSIS — Z86711 Personal history of pulmonary embolism: Secondary | ICD-10-CM

## 2016-09-11 DIAGNOSIS — R680 Hypothermia, not associated with low environmental temperature: Secondary | ICD-10-CM | POA: Diagnosis not present

## 2016-09-11 DIAGNOSIS — Z66 Do not resuscitate: Secondary | ICD-10-CM | POA: Diagnosis present

## 2016-09-11 DIAGNOSIS — E861 Hypovolemia: Secondary | ICD-10-CM | POA: Diagnosis present

## 2016-09-11 DIAGNOSIS — D649 Anemia, unspecified: Secondary | ICD-10-CM | POA: Diagnosis present

## 2016-09-11 DIAGNOSIS — R0600 Dyspnea, unspecified: Secondary | ICD-10-CM | POA: Diagnosis not present

## 2016-09-11 DIAGNOSIS — F0390 Unspecified dementia without behavioral disturbance: Secondary | ICD-10-CM | POA: Diagnosis present

## 2016-09-11 DIAGNOSIS — R0902 Hypoxemia: Secondary | ICD-10-CM | POA: Diagnosis not present

## 2016-09-11 DIAGNOSIS — M6281 Muscle weakness (generalized): Secondary | ICD-10-CM | POA: Diagnosis not present

## 2016-09-11 DIAGNOSIS — Z4682 Encounter for fitting and adjustment of non-vascular catheter: Secondary | ICD-10-CM | POA: Diagnosis not present

## 2016-09-11 DIAGNOSIS — R0682 Tachypnea, not elsewhere classified: Secondary | ICD-10-CM | POA: Diagnosis not present

## 2016-09-11 DIAGNOSIS — E785 Hyperlipidemia, unspecified: Secondary | ICD-10-CM | POA: Diagnosis present

## 2016-09-11 DIAGNOSIS — E876 Hypokalemia: Secondary | ICD-10-CM | POA: Diagnosis not present

## 2016-09-11 LAB — CBC WITH DIFFERENTIAL/PLATELET
BASOS ABS: 0 10*3/uL (ref 0.0–0.1)
BASOS PCT: 0 %
EOS ABS: 0 10*3/uL (ref 0.0–0.7)
Eosinophils Relative: 0 %
HEMATOCRIT: 28.4 % — AB (ref 39.0–52.0)
HEMOGLOBIN: 8.7 g/dL — AB (ref 13.0–17.0)
Lymphocytes Relative: 2 %
Lymphs Abs: 0.6 10*3/uL — ABNORMAL LOW (ref 0.7–4.0)
MCH: 28.5 pg (ref 26.0–34.0)
MCHC: 30.6 g/dL (ref 30.0–36.0)
MCV: 93.1 fL (ref 78.0–100.0)
Monocytes Absolute: 0.9 10*3/uL (ref 0.1–1.0)
Monocytes Relative: 3 %
NEUTROS ABS: 27.7 10*3/uL — AB (ref 1.7–7.7)
NEUTROS PCT: 95 %
Platelets: 301 10*3/uL (ref 150–400)
RBC: 3.05 MIL/uL — AB (ref 4.22–5.81)
RDW: 17.1 % — ABNORMAL HIGH (ref 11.5–15.5)
WBC: 29.2 10*3/uL — AB (ref 4.0–10.5)

## 2016-09-11 LAB — URINALYSIS, ROUTINE W REFLEX MICROSCOPIC
BILIRUBIN URINE: NEGATIVE
GLUCOSE, UA: NEGATIVE mg/dL
Hgb urine dipstick: NEGATIVE
KETONES UR: NEGATIVE mg/dL
Leukocytes, UA: NEGATIVE
Nitrite: NEGATIVE
PH: 5 (ref 5.0–8.0)
PROTEIN: 30 mg/dL — AB
Specific Gravity, Urine: 1.017 (ref 1.005–1.030)

## 2016-09-11 LAB — BLOOD GAS, ARTERIAL
Acid-Base Excess: 4.4 mmol/L — ABNORMAL HIGH (ref 0.0–2.0)
BICARBONATE: 28.1 mmol/L — AB (ref 20.0–28.0)
DRAWN BY: 270161
FIO2: 100
MECHVT: 500 mL
O2 Saturation: 99.8 %
PEEP: 5 cmH2O
PO2 ART: 458 mmHg — AB (ref 83.0–108.0)
Patient temperature: 37
RATE: 16 resp/min
pCO2 arterial: 52.7 mmHg — ABNORMAL HIGH (ref 32.0–48.0)
pH, Arterial: 7.366 (ref 7.350–7.450)

## 2016-09-11 LAB — COMPREHENSIVE METABOLIC PANEL
ALK PHOS: 59 U/L (ref 38–126)
ALT: 39 U/L (ref 17–63)
ANION GAP: 7 (ref 5–15)
AST: 35 U/L (ref 15–41)
Albumin: 2.9 g/dL — ABNORMAL LOW (ref 3.5–5.0)
BUN: 25 mg/dL — ABNORMAL HIGH (ref 6–20)
CALCIUM: 8.7 mg/dL — AB (ref 8.9–10.3)
CO2: 35 mmol/L — AB (ref 22–32)
CREATININE: 1.47 mg/dL — AB (ref 0.61–1.24)
Chloride: 99 mmol/L — ABNORMAL LOW (ref 101–111)
GFR, EST AFRICAN AMERICAN: 54 mL/min — AB (ref >60)
GFR, EST NON AFRICAN AMERICAN: 47 mL/min — AB (ref >60)
Glucose, Bld: 260 mg/dL — ABNORMAL HIGH (ref 65–99)
Potassium: 4.4 mmol/L (ref 3.5–5.1)
SODIUM: 141 mmol/L (ref 135–145)
TOTAL PROTEIN: 5.5 g/dL — AB (ref 6.5–8.1)
Total Bilirubin: 0.3 mg/dL (ref 0.3–1.2)

## 2016-09-11 LAB — I-STAT CG4 LACTIC ACID, ED
LACTIC ACID, VENOUS: 2.03 mmol/L — AB (ref 0.5–1.9)
Lactic Acid, Venous: 2.17 mmol/L (ref 0.5–1.9)

## 2016-09-11 LAB — PROTIME-INR
INR: 1.01
PROTHROMBIN TIME: 13.3 s (ref 11.4–15.2)

## 2016-09-11 LAB — TROPONIN I: TROPONIN I: 0.03 ng/mL — AB (ref <0.03)

## 2016-09-11 LAB — APTT: APTT: 26 s (ref 24–36)

## 2016-09-11 MED ORDER — SODIUM CHLORIDE 0.9 % IV BOLUS (SEPSIS)
1000.0000 mL | Freq: Once | INTRAVENOUS | Status: AC
  Administered 2016-09-11: 1000 mL via INTRAVENOUS

## 2016-09-11 MED ORDER — HEPARIN (PORCINE) IN NACL 100-0.45 UNIT/ML-% IJ SOLN
1200.0000 [IU]/h | INTRAMUSCULAR | Status: DC
  Administered 2016-09-11: 950 [IU]/h via INTRAVENOUS
  Administered 2016-09-12 – 2016-09-14 (×2): 800 [IU]/h via INTRAVENOUS
  Administered 2016-09-15: 1050 [IU]/h via INTRAVENOUS
  Administered 2016-09-17: 1200 [IU]/h via INTRAVENOUS
  Filled 2016-09-11 (×11): qty 250

## 2016-09-11 MED ORDER — DEXTROSE 5 % IV SOLN
2.0000 g | INTRAVENOUS | Status: DC
  Administered 2016-09-12 – 2016-09-13 (×2): 2 g via INTRAVENOUS
  Filled 2016-09-11 (×4): qty 2

## 2016-09-11 MED ORDER — MIDAZOLAM HCL 2 MG/2ML IJ SOLN
1.0000 mg | INTRAMUSCULAR | Status: DC | PRN
  Administered 2016-09-12: 1 mg via INTRAVENOUS
  Filled 2016-09-11 (×3): qty 2

## 2016-09-11 MED ORDER — ROCURONIUM BROMIDE 50 MG/5ML IV SOLN
80.0000 mg | Freq: Once | INTRAVENOUS | Status: AC
  Administered 2016-09-11: 80 mg via INTRAVENOUS

## 2016-09-11 MED ORDER — VANCOMYCIN HCL IN DEXTROSE 1-5 GM/200ML-% IV SOLN: 1000.0000 mg | Freq: Once | INTRAVENOUS | Status: DC

## 2016-09-11 MED ORDER — ETOMIDATE 2 MG/ML IV SOLN
20.0000 mg | Freq: Once | INTRAVENOUS | Status: AC
  Administered 2016-09-11: 20 mg via INTRAVENOUS

## 2016-09-11 MED ORDER — FENTANYL CITRATE (PF) 100 MCG/2ML IJ SOLN
100.0000 ug | Freq: Once | INTRAMUSCULAR | Status: AC
  Administered 2016-09-11: 100 ug via INTRAVENOUS
  Filled 2016-09-11: qty 2

## 2016-09-11 MED ORDER — IPRATROPIUM-ALBUTEROL 0.5-2.5 (3) MG/3ML IN SOLN
3.0000 mL | Freq: Four times a day (QID) | RESPIRATORY_TRACT | Status: DC
  Filled 2016-09-11: qty 3

## 2016-09-11 MED ORDER — ALBUTEROL SULFATE (2.5 MG/3ML) 0.083% IN NEBU
2.5000 mg | INHALATION_SOLUTION | RESPIRATORY_TRACT | Status: DC | PRN
  Administered 2016-09-15 – 2016-09-16 (×3): 2.5 mg via RESPIRATORY_TRACT
  Filled 2016-09-11 (×3): qty 3

## 2016-09-11 MED ORDER — MIDAZOLAM HCL 2 MG/2ML IJ SOLN
4.0000 mg | Freq: Once | INTRAMUSCULAR | Status: AC
  Administered 2016-09-11: 4 mg via INTRAVENOUS
  Filled 2016-09-11: qty 4

## 2016-09-11 MED ORDER — HEPARIN BOLUS VIA INFUSION
3000.0000 [IU] | Freq: Once | INTRAVENOUS | Status: AC
  Administered 2016-09-11: 3000 [IU] via INTRAVENOUS

## 2016-09-11 MED ORDER — FENTANYL CITRATE (PF) 100 MCG/2ML IJ SOLN
50.0000 ug | Freq: Once | INTRAMUSCULAR | Status: AC
  Administered 2016-09-11: 50 ug via INTRAVENOUS
  Filled 2016-09-11: qty 2

## 2016-09-11 MED ORDER — FENTANYL 2500MCG IN NS 250ML (10MCG/ML) PREMIX INFUSION
25.0000 ug/h | INTRAVENOUS | Status: DC
  Administered 2016-09-11: 200 ug/h via INTRAVENOUS
  Administered 2016-09-12: 300 ug/h via INTRAVENOUS
  Administered 2016-09-12: 400 ug/h via INTRAVENOUS
  Administered 2016-09-13 – 2016-09-14 (×2): 225 ug/h via INTRAVENOUS
  Administered 2016-09-14: 50 ug/h via INTRAVENOUS
  Administered 2016-09-15: 200 ug/h via INTRAVENOUS
  Administered 2016-09-15: 300 ug/h via INTRAVENOUS
  Administered 2016-09-16: 400 ug/h via INTRAVENOUS
  Filled 2016-09-11 (×9): qty 250

## 2016-09-11 MED ORDER — SODIUM CHLORIDE 0.9 % IV BOLUS (SEPSIS)
500.0000 mL | Freq: Once | INTRAVENOUS | Status: AC
  Administered 2016-09-11: 500 mL via INTRAVENOUS

## 2016-09-11 MED ORDER — FENTANYL BOLUS VIA INFUSION
25.0000 ug | INTRAVENOUS | Status: DC | PRN
  Administered 2016-09-12 (×5): 25 ug via INTRAVENOUS
  Filled 2016-09-11: qty 25

## 2016-09-11 MED ORDER — DEXTROSE 5 % IV SOLN
2.0000 g | Freq: Once | INTRAVENOUS | Status: AC
  Administered 2016-09-11: 2 g via INTRAVENOUS
  Filled 2016-09-11: qty 2

## 2016-09-11 MED ORDER — MIDAZOLAM HCL 2 MG/2ML IJ SOLN
1.0000 mg | INTRAMUSCULAR | Status: DC | PRN
  Administered 2016-09-11 – 2016-09-13 (×4): 1 mg via INTRAVENOUS
  Filled 2016-09-11 (×2): qty 2

## 2016-09-11 MED ORDER — VANCOMYCIN HCL 10 G IV SOLR
1250.0000 mg | Freq: Once | INTRAVENOUS | Status: AC
  Administered 2016-09-11: 1250 mg via INTRAVENOUS
  Filled 2016-09-11: qty 1250

## 2016-09-11 MED ORDER — INSULIN ASPART 100 UNIT/ML ~~LOC~~ SOLN
2.0000 [IU] | SUBCUTANEOUS | Status: DC
Start: 2016-09-12 — End: 2016-09-17
  Administered 2016-09-13 (×2): 2 [IU] via SUBCUTANEOUS
  Administered 2016-09-14: 4 [IU] via SUBCUTANEOUS
  Administered 2016-09-14 – 2016-09-15 (×5): 2 [IU] via SUBCUTANEOUS
  Administered 2016-09-15 (×6): 4 [IU] via SUBCUTANEOUS
  Administered 2016-09-16: 2 [IU] via SUBCUTANEOUS
  Administered 2016-09-16: 4 [IU] via SUBCUTANEOUS

## 2016-09-11 MED ORDER — VANCOMYCIN HCL IN DEXTROSE 1-5 GM/200ML-% IV SOLN
1000.0000 mg | INTRAVENOUS | Status: DC
  Administered 2016-09-12 – 2016-09-13 (×2): 1000 mg via INTRAVENOUS
  Filled 2016-09-11 (×3): qty 200

## 2016-09-11 MED ORDER — PANTOPRAZOLE SODIUM 40 MG IV SOLR
40.0000 mg | Freq: Every day | INTRAVENOUS | Status: DC
  Administered 2016-09-11 – 2016-09-15 (×5): 40 mg via INTRAVENOUS
  Filled 2016-09-11 (×6): qty 40

## 2016-09-11 MED ORDER — SODIUM CHLORIDE 0.9 % IV SOLN
25.0000 ug/h | INTRAVENOUS | Status: DC
  Filled 2016-09-11: qty 50

## 2016-09-11 MED ORDER — BUDESONIDE 0.5 MG/2ML IN SUSP
0.5000 mg | Freq: Two times a day (BID) | RESPIRATORY_TRACT | Status: DC
  Administered 2016-09-12 – 2016-09-17 (×10): 0.5 mg via RESPIRATORY_TRACT
  Filled 2016-09-11 (×16): qty 2

## 2016-09-11 MED ORDER — DILTIAZEM HCL 100 MG IV SOLR
5.0000 mg/h | INTRAVENOUS | Status: DC
  Administered 2016-09-12 – 2016-09-13 (×2): 5 mg/h via INTRAVENOUS
  Administered 2016-09-14: 12.5 mg/h via INTRAVENOUS
  Administered 2016-09-14: 10 mg/h via INTRAVENOUS
  Administered 2016-09-14: 12.5 mg/h via INTRAVENOUS
  Administered 2016-09-15 (×3): 15 mg/h via INTRAVENOUS
  Administered 2016-09-16: 10 mg/h via INTRAVENOUS
  Administered 2016-09-16: 7.5 mg/h via INTRAVENOUS
  Administered 2016-09-17: 10 mg/h via INTRAVENOUS
  Filled 2016-09-11 (×11): qty 100

## 2016-09-11 MED ORDER — SODIUM CHLORIDE 0.9 % IV SOLN
250.0000 mL | INTRAVENOUS | Status: DC | PRN
  Administered 2016-09-17: 250 mL via INTRAVENOUS

## 2016-09-11 MED ORDER — SODIUM CHLORIDE 0.9 % IV BOLUS (SEPSIS)
250.0000 mL | Freq: Once | INTRAVENOUS | Status: AC
  Administered 2016-09-11: 250 mL via INTRAVENOUS

## 2016-09-11 NOTE — ED Notes (Signed)
Pt becoming agitated and moving while on vent.  MD ordered fentanyl and versed. RN scanned in medications and [facility] administered.

## 2016-09-11 NOTE — ED Notes (Signed)
Oxygenating pt at present with high flow O2

## 2016-09-11 NOTE — ED Triage Notes (Signed)
Pt from [facility], found in bed with vomit in airway.  Hypotensive and bradycardic.  Ventilate him at facility

## 2016-09-11 NOTE — H&P (Addendum)
PULMONARY / CRITICAL CARE MEDICINE   Name: Garrett Spencer MRN: [id number] DOB: 1945-09-12    ADMISSION DATE:  09/11/2016 CONSULTATION DATE:  6/6  REFERRING MD:  Dr. Anitra Lauth [facility] EDP  CHIEF COMPLAINT:  VDRF  HISTORY OF PRESENT ILLNESS:   71 year old male with PMH as below, which is significant for COPD, DM, Hepatitis C, HTN, Tremors, and Bipolar disorder. He was recently admitted 5/22-5/29 for hypoxemic respiratory failure due to COPD exacerbation treated with IV steroids and nebulized bronchodilators. Course complicated by atrial tachycardia ? MAT treated with beta blockers. He was discharged to SNF. He was seen in ED 6/4 for hypotension, but once he woke up his BP was 103/64 and he was discharged to SNF.   6/6 he was found minimally responsive at SNF with evidence of aspiration of emesis. He required BVM ventilation en route and was intubated in ED for airway protection. Initially he was hypotensive and in AF with RVR, but once airway was established and IVF was started he converted back into NSR.  He was started on antibiotics for HCAP. PCCM asked to admit to [facility] for ICU care.   PAST MEDICAL HISTORY :  He  has a past medical history of Anxiety; Arthritis; Asthma; Bipolar disorder ([place]); BPH with obstruction/lower urinary tract symptoms; Cancer ([place]); Cognitive communication deficit; Constipation; COPD (chronic obstructive pulmonary disease) ([place]); Depression; Diabetes mellitus without complication ([place]); Diaphragmatic hernia; Generalized weakness; GERD (gastroesophageal reflux disease); Hepatitis C; History of pulmonary embolism; History of shingles; HOH (hard of hearing); Hyperlipidemia; Hypertension; Hypocalcemia; Insomnia; Pneumonia; Polyneuropathy; Torn rotator cuff; Tremors of nervous system; and Vitamin D deficiency.  PAST SURGICAL HISTORY: He  has a past surgical history that includes Tongue surgery; ORIF femur fracture (Left); Transurethral resection of prostate; Cataract  extraction w/PHACO (Left, 12/19/2014); and Cataract extraction w/PHACO (Right, 02/20/2015).  Allergies  Allergen Reactions  . Benadryl [Diphenhydramine]     unknown  . Haldol [Haloperidol] Other (See Comments)    unknown  . Shellfish Allergy Other (See Comments)    unknown    No current facility-administered medications on file prior to encounter.    Current Outpatient Prescriptions on File Prior to Encounter  Medication Sig  . albuterol (PROVENTIL) (2.5 MG/3ML) 0.083% nebulizer solution Take 2.5 mg by nebulization 4 (four) times daily.  . ARIPiprazole (ABILIFY) 5 MG tablet Take 5 mg by mouth daily.  Marland Kitchen aspirin 81 MG chewable tablet Chew 1 tablet (81 mg total) by mouth daily.  Marland Kitchen atorvastatin (LIPITOR) 10 MG tablet Take 10 mg by mouth daily.  . baclofen (LIORESAL) 10 MG tablet Take 5 mg by mouth 3 (three) times daily.  . budesonide-formoterol (SYMBICORT) 160-4.5 MCG/ACT inhaler Inhale 2 puffs into the lungs 2 (two) times daily.  . calcium-vitamin D (OSCAL WITH D) 500-200 MG-UNIT per tablet Take 1 tablet by mouth 2 (two) times daily.   . clonazePAM (KLONOPIN) 2 MG tablet Take 1 tablet (2 mg total) by mouth 2 (two) times daily.  Marland Kitchen diltiazem (CARDIZEM CD) 240 MG 24 hr capsule Take 1 capsule (240 mg total) by mouth daily.  . ferrous sulfate 325 (65 FE) MG tablet Take 325 mg by mouth 2 (two) times daily with a meal.  . finasteride (PROSCAR) 5 MG tablet Take 5 mg by mouth daily.  Marland Kitchen gabapentin (NEURONTIN) 100 MG capsule Take 1 capsule by mouth 3 (three) times daily.  . INCRUSE ELLIPTA 62.5 MCG/INH AEPB Inhale 1 puff into the lungs daily.  . isosorbide mononitrate (IMDUR) 30  MG 24 hr tablet Take 30 mg by mouth daily.  Marland Kitchen LORazepam (ATIVAN) 1 MG tablet Take 1 tablet (1 mg total) by mouth every 8 (eight) hours as needed for anxiety.  . metFORMIN (GLUCOPHAGE) 500 MG tablet Take 500 mg by mouth 2 (two) times daily with a meal.  . metoprolol tartrate (LOPRESSOR) 25 MG tablet Take 1 tablet (25 mg  total) by mouth 2 (two) times daily.  . nitroGLYCERIN (NITROSTAT) 0.4 MG SL tablet Place 0.4 mg under the tongue every 5 (five) minutes as needed for chest pain.  . traMADol (ULTRAM) 50 MG tablet Take 1 tablet (50 mg total) by mouth 4 (four) times daily.  . traZODone (DESYREL) 50 MG tablet Take 50 mg by mouth at bedtime.  . vitamin C (ASCORBIC ACID) 500 MG tablet Take 500 mg by mouth 2 (two) times daily.  . predniSONE (DELTASONE) 20 MG tablet Take 3 tablets (60 mg total) by mouth daily with breakfast. And decrease by one tablet daily (Patient not taking: Reported on 09/11/2016)    FAMILY HISTORY:  His has no family status information on file.    SOCIAL HISTORY: He  reports that he quit smoking about 3 years ago. His smoking use included Cigarettes. He has a 67.50 pack-year smoking history. He has never used smokeless tobacco. He reports that he does not drink alcohol or use drugs.  REVIEW OF SYSTEMS:   Unable as patient is intubtead  SUBJECTIVE:    VITAL SIGNS: BP 102/70   Pulse 83   Temp (!) 100.8 F (38.2 C)   Resp 16   SpO2 100%   HEMODYNAMICS:    VENTILATOR SETTINGS: Vent Mode: PRVC FiO2 (%):  [50 %-100 %] 50 % Set Rate:  [16 bmp] 16 bmp Vt Set:  [500 mL] 500 mL PEEP:  [5 cmH20] 5 cmH20 Plateau Pressure:  [26 cmH20] 26 cmH20  INTAKE / OUTPUT: I/O last 3 completed shifts: In: 2000 [IV Piggyback:2000] Out: -   PHYSICAL EXAMINATION: General:  Frail elderly male. Mildly agitated on vent Neuro:  Alert, follows commands.  HEENT:  [place]/AT, PERRL, No JVD Cardiovascular:  Tachy, IRIR Lungs:  Coarse wheeze Abdomen:  Soft, non-tender, non-distended. Umbilical hernia, feels like contains bowel. Reducible.  Musculoskeletal:  No acute deformity. Trace lower extremity edema.  Skin:  No acute deformity or ROM limitation  LABS:  BMET  Recent Labs Lab 09/10/16 0040 09/11/16 1715  NA 138 141  K 4.0 4.4  CL 101 99*  CO2 30 35*  BUN 24* 25*  CREATININE 1.12 1.47*   GLUCOSE 96 260*    Electrolytes  Recent Labs Lab 09/10/16 0040 09/11/16 1715  CALCIUM 9.3 8.7*    CBC  Recent Labs Lab 09/10/16 0040 09/11/16 1715  WBC 20.5* 29.2*  HGB 8.5* 8.7*  HCT 27.0* 28.4*  PLT 260 301    Coag's No results for input(s): APTT, INR in the last 168 hours.  Sepsis Markers  Recent Labs Lab 09/10/16 0040 09/11/16 1732 09/11/16 2035  LATICACIDVEN 0.9 2.17* 2.03*    ABG  Recent Labs Lab 09/11/16 1745  PHART 7.366  PCO2ART 52.7*  PO2ART 458*    Liver Enzymes  Recent Labs Lab 09/10/16 0040 09/11/16 1715  AST 30 35  ALT 41 39  ALKPHOS 54 59  BILITOT 0.7 0.3  ALBUMIN 3.1* 2.9*    Cardiac Enzymes  Recent Labs Lab 09/10/16 0040 09/11/16 1743  TROPONINI <0.03 0.03*    Glucose No results for input(s): GLUCAP in the last  168 hours.  Imaging Ct Head Wo Contrast  Result Date: 09/11/2016 CLINICAL DATA:  Unresponsive. EXAM: CT HEAD WITHOUT CONTRAST TECHNIQUE: Contiguous axial images were obtained from the base of the skull through the vertex without intravenous contrast. COMPARISON:  None available currently. FINDINGS: Brain: Mild diffuse cortical atrophy is noted. Mild chronic ischemic white matter disease is noted. No mass effect or midline shift is noted. Ventricular size is within normal limits. There is no evidence of mass lesion, hemorrhage or acute infarction. Vascular: No hyperdense vessel or unexpected calcification. Skull: Normal. Negative for fracture or focal lesion. Sinuses/Orbits: No acute finding. Other: None. IMPRESSION: Mild diffuse cortical atrophy. Mild chronic ischemic white matter disease. No acute intracranial abnormality seen. Electronically Signed   By: Marijo Conception, M.D.   On: 09/11/2016 21:22   Dg Chest Portable 1 View  Result Date: 09/11/2016 CLINICAL DATA:  Intubation and gastric tube placement. Emesis found in airway. Hypotensive and bradycardic. EXAM: PORTABLE CHEST 1 VIEW COMPARISON:  09/09/2016  FINDINGS: The heart size and mediastinal contours are within normal limits. There is aortic atherosclerosis at the arch without aneurysm. The tip of an endotracheal tube is 7.6 cm above the carina. A gastric tube with side port is seen below the left hemidiaphragm in the expected location of the stomach, the tip projecting back up to the fundus. Emphysematous hyperinflation of the lungs without pneumonic consolidation, effusion or pneumothorax. Chronic scarring seen at the right lung apex. The visualized skeletal structures are nonacute. 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 IMPRESSION: 1. Satisfactory support line and tube positions. 2. Emphysematous hyperinflation of the lungs without pneumonic consolidation or CHF. 3. Aortic atherosclerosis. 4. 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 Electronically Signed   By: Ashley Royalty M.D.   On: 09/11/2016 18:15     STUDIES:  6/6 CT head > Mild diffuse cortical atrophy. Mild chronic ischemic white matter disease. No acute intracranial abnormality seen.  CULTURES: Blood 6/6 > Urine 6/6 >  ANTIBIOTICS: Cefepime 6/6 > Vanco 6/6 >  SIGNIFICANT EVENTS:   LINES/TUBES: ETT 6/6 >  DISCUSSION: 71 year old male admitted for aspiration/HCAP. Intubated for airway protection. Hypotensive responded to volume. A flutter on monitor with RVR.   ASSESSMENT / PLAN:  PULMONARY A: Acute hypoxemic respiratory failure secondary to aspiration COPD without acute exacerbation  P:   Full vent support Follow ABG CXR to ensure ETT placement VAP bundle Scheduled duoneb, budesonide  CARDIOVASCULAR A:  Hypotension likely secondary to severe sepsis with component of hypovolemia. Lactic very mildly elevated.  PAF/Aflutter now rapid  P:  Telemetry Continued IVF Heparin infusion If AF doesn't gain rate control with adequate sedation may need to start amio.  May need pressors Holding home diltiazem, lopressor, imdur due to  hypotension  RENAL A:   AKI  P:   Follow BMP Gentle hydration  GASTROINTESTINAL A:   No acute issues  P:   NPO Protonix for SUP  HEMATOLOGIC A:   Anemia (basline Hgb 10.5)  P:  Follow CBC SQ heparin  INFECTIOUS A:   Aspiration PNA Severe sepsis  P:   ABX as above Follow cultures  ENDOCRINE A:   DM  P:   SSI  NEUROLOGIC A:   Acute metabolic encephalopathy  P:   RASS goal: -1 to -2 Fentanyl infusion PRN versed   FAMILY  - Updates:   - Inter-disciplinary family meet or Palliative Care meeting due by:  6/12   Georgann Housekeeper, AGACNP-BC [place] [facility] Pager  [phone] or [phone]  09/11/2016 10:19 PM    ATTENDING NOTE / ATTESTATION NOTE :   I have discussed the case with the resident/APP  Georgann Housekeeper NP  I agree with the resident/APP's  history, physical examination, assessment, and plans.    I have edited the above note and modified it according to our agreed history, physical examination, assessment and plan.   Briefly, 71 year old male with PMH as below, which is significant for COPD, DM, Hepatitis C, HTN, Tremors, and Bipolar disorder. He was recently admitted 5/22-5/29 for hypoxemic respiratory failure due to COPD exacerbation treated with IV steroids and nebulized bronchodilators. Course complicated by atrial tachycardia ? MAT treated with beta blockers. He was discharged to SNF. He was seen in ED 6/4 for hypotension, but once he woke up his BP was 103/64 and he was discharged to SNF.   6/6 he was found minimally responsive at SNF with evidence of aspiration of emesis. He required BVM ventilation en route and was intubated in ED for airway protection. Initially he was hypotensive and in AF with RVR, but once airway was established and IVF was started he converted back into NSR.  He was started on antibiotics for HCAP. PCCM asked to admit to [facility] for ICU care.   Patient was transferred to [facility]. When he  got here, he was agitated and was bucking the vent. He went into atrial fibrillation/atrial flutter for which she was subsequently started on Cardizem drip. He was also started on fentanyl drip which is kept him comfortable. On transfer, they were able to suction a lot of thick secretions.   Vitals:  Vitals:   09/11/16 2130 09/11/16 2234 09/11/16 2300 09/12/16 0000  BP: 102/70 98/72 117/82 92/67  Pulse: 83 93 82 80  Resp: 16 (!) 27 (!) 26 16  Temp: (!) 100.8 F (38.2 C)     SpO2: 100% 100% 100% 99%    Constitutional/General: chronically ill, intubated, sedated, not in any distress  There is no height or weight on file to calculate BMI. Wt Readings from Last 3 Encounters:  09/09/16 67.6 kg (149 lb)  09/02/16 67.8 kg (149 lb 7.6 oz)  02/20/15 68.5 kg (151 lb)    HEENT: PERLA, anicteric sclerae. (-) Oral thrush. Intubated, ETT in place  Neck: No masses. Midline trachea. No JVD, (-) LAD. (-) bruits appreciated.  Respiratory/Chest: Grossly normal chest. (-) deformity. (-) Accessory muscle use.  Symmetric expansion. Diminished BS on both lower lung zones. (-) wheezing,rhonchi Crackles at the bases (-) egophony  Cardiovascular: Regular rate and  rhythm, heart sounds normal, no murmur or gallops,  Trace peripheral edema  Gastrointestinal:  Normal bowel sounds. Soft, non-tender. No hepatosplenomegaly.  (-) masses.   Musculoskeletal:  Normal muscle tone.   Extremities: Grossly normal. (-) clubbing, cyanosis.  Trace  edema  Skin: (-) rash,lesions seen.   Neurological/Psychiatric : sedated, intubated. CN grossly intact. (-) lateralizing signs.     CBC Recent Labs     09/10/16  0040  09/11/16  1715  WBC  20.5*  29.2*  HGB  8.5*  8.7*  HCT  27.0*  28.4*  PLT  260  301    Coag's Recent Labs     09/11/16  2125  APTT  26  INR  1.01    BMET Recent Labs     09/10/16  0040  09/11/16  1715  NA  138  141  K  4.0  4.4  CL  101  99*  CO2  30  35*  BUN  24*  25*   CREATININE  1.12  1.47*  GLUCOSE  96  260*    Electrolytes Recent Labs     09/10/16  0040  09/11/16  1715  CALCIUM  9.3  8.7*    Sepsis Markers No results for input(s): PROCALCITON, O2SATVEN in the last 72 hours.  Invalid input(s): LACTICACIDVEN  ABG Recent Labs     09/11/16  1745  PHART  7.366  PCO2ART  52.7*  PO2ART  458*    Liver Enzymes Recent Labs     09/10/16  0040  09/11/16  1715  AST  30  35  ALT  41  39  ALKPHOS  54  59  BILITOT  0.7  0.3  ALBUMIN  3.1*  2.9*    Cardiac Enzymes Recent Labs     09/10/16  0040  09/11/16  1743  TROPONINI  <0.03  0.03*    Glucose No results for input(s): GLUCAP in the last 72 hours.  Imaging Ct Head Wo Contrast  Result Date: 09/11/2016 CLINICAL DATA:  Unresponsive. EXAM: CT HEAD WITHOUT CONTRAST TECHNIQUE: Contiguous axial images were obtained from the base of the skull through the vertex without intravenous contrast. COMPARISON:  None available currently. FINDINGS: Brain: Mild diffuse cortical atrophy is noted. Mild chronic ischemic white matter disease is noted. No mass effect or midline shift is noted. Ventricular size is within normal limits. There is no evidence of mass lesion, hemorrhage or acute infarction. Vascular: No hyperdense vessel or unexpected calcification. Skull: Normal. Negative for fracture or focal lesion. Sinuses/Orbits: No acute finding. Other: None. IMPRESSION: Mild diffuse cortical atrophy. Mild chronic ischemic white matter disease. No acute intracranial abnormality seen. Electronically Signed   By: Marijo Conception, M.D.   On: 09/11/2016 21:22   Dg Chest Portable 1 View  Result Date: 09/11/2016 CLINICAL DATA:  Intubation and gastric tube placement. Emesis found in airway. Hypotensive and bradycardic. EXAM: PORTABLE CHEST 1 VIEW COMPARISON:  09/09/2016 FINDINGS: The heart size and mediastinal contours are within normal limits. There is aortic atherosclerosis at the arch without aneurysm. The tip of  an endotracheal tube is 7.6 cm above the carina. A gastric tube with side port is seen below the left hemidiaphragm in the expected location of the stomach, the tip projecting back up to the fundus. Emphysematous hyperinflation of the lungs without pneumonic consolidation, effusion or pneumothorax. Chronic scarring seen at the right lung apex. The visualized skeletal structures are nonacute. 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 IMPRESSION: 1. Satisfactory support line and tube positions. 2. Emphysematous hyperinflation of the lungs without pneumonic consolidation or CHF. 3. Aortic atherosclerosis. 4. 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 Electronically Signed   By: Ashley Royalty M.D.   On: 09/11/2016 18:15    Assessment/Plan: Acute hypoxemic hypercapneic respiratory respiratory failure secondary to aspiration pneumonitis vs HAP +  COPD (not in exacerbation) - Per history, patient was recently admitted for acute exacerbation of COPD and was discharged back to the nursing home. Allegedly, he vomited and aspirated food and went into respiratory failure. Chest x-ray on admission does not show significant infiltrate/PNA. - Continue ventilatory support. Re-check ABG in the morning. - Continue broad-spectrum antibiotics with cefepime and vancomycin. Check pro calcitonin to help with de-escalation. - Continue with Pulmicort.  Will d/c duoneb ( 2/2 uncontrolled afib/flutter) and start atrovent QID.  - He has atrial fibrillation and atrial flutter which was uncontrolled on admission and  for which Cardizem drip was started. I imagine this needs to be controlled before we start weaning. - Start weaning as able. - He may need a swallow examination once extubated.   Hypotension, likely related to infection + hypovolemia - BP better at 90/60ish with IVFs.  - cont IVF - not on pressors   Atrial fibrillation/atrial flutter. H/O CAD.  - Continue Cardizem drip. - Continue heparin drip - We'll  order 2-D echo. There was an echo done and of May but I'm not seeing results. I'm not sure if it that echo was actually performed.    AKI, likely related to hypovolemia + infection - cont IVF   Anxiety. Tremors.  - Continue fentanyl drip - Versed when necessary   I spent  30  minutes of Critical Care time with this patient today. This is my time spent independent of the APP or resident.   Family :  No family at bedside.    Monica Becton, MD 09/12/2016, 1:33 AM [facility] Pager [phone] After 3 pm or if no answer, call [phone]

## 2016-09-11 NOTE — Consult Note (Addendum)
Consult Note   Garrett Spencer KKX:[id number] DOB: 1945-09-03 DOA: 09/11/2016  PCP: Neale Burly, MD Patient coming from: [facility]  Chief Complaint: respiratory failure  HPI: Garrett Spencer is a 71 y.o. male with medical history significant of HTN, HLD, remote PE, DM, COPD, bipolar, and BPH presenting with acute respiratory failure thought to be related to aspiration.  The patient lives in [place] and was admitted from 5/23-29 for respiratory failure secondary to COPD exacerbation as well as SVT/atrial tachycardia.  He was discharged on 5/29 and returned to the ER 6/4PM with SOB, hypoxia to 81%, and hypotension to 80/40, improved with IVF.   He was again discharged to SNF and was brought back today after having been "found in bed with vomit in airway.  Hypotensive and bradycardic.  Ventilate him at facility."  He was intubated and sedated at the time of my evaluation and he was unaccompanied.   ED Course: Elective intubation for airway protection; hypotension; started on sepsis pathway with abx for HCAP.  Given early goal-directed fluid therapy.  Afib on monitor, no AC; spontaneously converted to NSR.  Mildly elevated lactate.  Marked hypothermia, with warming blanket in place.  Review of Systems: Unable to perform   PMH, PSH, SH, and FH reviewed in Epic but unable to verify with patient.  Past Medical History:  Diagnosis Date  . Anxiety   . Arthritis   . Asthma   . Bipolar disorder ([place])   . BPH with obstruction/lower urinary tract symptoms   . Cancer ([place])   . Cognitive communication deficit   . Constipation   . COPD (chronic obstructive pulmonary disease) ([place])   . Depression   . Diabetes mellitus without complication ([place])   . Diaphragmatic hernia   . Generalized weakness   . GERD (gastroesophageal reflux disease)   . Hepatitis C   . History of pulmonary embolism   . History of shingles   . HOH (hard of hearing)   . Hyperlipidemia   . Hypertension   . Hypocalcemia    . Insomnia   . Pneumonia   . Polyneuropathy   . Torn rotator cuff    left  . Tremors of nervous system   . Vitamin D deficiency     Past Surgical History:  Procedure Laterality Date  . CATARACT EXTRACTION W/PHACO Left 12/19/2014   Procedure: CATARACT EXTRACTION PHACO AND INTRAOCULAR LENS PLACEMENT (IOC);  Surgeon: Tonny Branch, MD;  Location: [facility];  Service: Ophthalmology;  Laterality: Left;  CDE: 19.97  . CATARACT EXTRACTION W/PHACO Right 02/20/2015   Procedure: CATARACT EXTRACTION PHACO AND INTRAOCULAR LENS PLACEMENT RIGHT EYE;  Surgeon: Tonny Branch, MD;  Location: [facility];  Service: Ophthalmology;  Laterality: Right;  16.59  . ORIF FEMUR FRACTURE Left   . TONGUE SURGERY     removal of cancer  . TRANSURETHRAL RESECTION OF PROSTATE      Social History   Social History  . Marital status: Married    Spouse name: N/A  . Number of children: N/A  . Years of education: N/A   Occupational History  . Not on file.   Social History Main Topics  . Smoking status: Former Smoker    Packs/day: 1.50    Years: 45.00    Types: Cigarettes    Quit date: 12/14/2012  . Smokeless tobacco: Never Used  . Alcohol use No  . Drug use: No  . Sexual activity: No   Other Topics Concern  . Not on file  Social History Narrative  . No narrative on file    Allergies  Allergen Reactions  . Benadryl [Diphenhydramine]     unknown  . Haldol [Haloperidol] Other (See Comments)    unknown  . Shellfish Allergy Other (See Comments)    unknown    History reviewed. No pertinent family history.  Prior to Admission medications   Medication Sig Start Date End Date Taking? Authorizing Provider  albuterol (PROVENTIL) (2.5 MG/3ML) 0.083% nebulizer solution Take 2.5 mg by nebulization 4 (four) times daily.    [provider]  ARIPiprazole (ABILIFY) 5 MG tablet Take 5 mg by mouth daily.    [provider]  aspirin 81 MG chewable tablet Chew 1 tablet (81 mg total) by mouth daily. 09/04/16    Orson Eva, MD  atorvastatin (LIPITOR) 10 MG tablet Take 10 mg by mouth daily.    [provider]  baclofen (LIORESAL) 10 MG tablet Take 5 mg by mouth 3 (three) times daily. 08/15/16   [provider]  budesonide-formoterol (SYMBICORT) 160-4.5 MCG/ACT inhaler Inhale 2 puffs into the lungs 2 (two) times daily.    [provider]  calcium-vitamin D (OSCAL WITH D) 500-200 MG-UNIT per tablet Take 1 tablet by mouth daily with breakfast.     [provider]  clonazePAM (KLONOPIN) 2 MG tablet Take 2 mg by mouth 2 (two) times daily.    [provider]  clonazePAM (KLONOPIN) 2 MG tablet Take 1 tablet (2 mg total) by mouth 2 (two) times daily. 09/03/16   Orson Eva, MD  diltiazem (CARDIZEM CD) 240 MG 24 hr capsule Take 1 capsule (240 mg total) by mouth daily. 09/04/16   Orson Eva, MD  ferrous sulfate 325 (65 FE) MG tablet Take 325 mg by mouth 2 (two) times daily with a meal.    [provider]  finasteride (PROSCAR) 5 MG tablet Take 5 mg by mouth daily.    [provider]  gabapentin (NEURONTIN) 100 MG capsule Take 1 capsule by mouth 3 (three) times daily. 08/15/16   [provider]  INCRUSE ELLIPTA 62.5 MCG/INH AEPB Inhale 1 puff into the lungs daily. 07/22/16   [provider]  isosorbide mononitrate (IMDUR) 30 MG 24 hr tablet Take 30 mg by mouth daily.    [provider]  LORazepam (ATIVAN) 1 MG tablet Take 1 mg by mouth every 12 (twelve) hours as needed for anxiety.    [provider]  LORazepam (ATIVAN) 1 MG tablet Take 1 tablet (1 mg total) by mouth every 8 (eight) hours as needed for anxiety. 09/03/16   Orson Eva, MD  metFORMIN (GLUCOPHAGE) 500 MG tablet Take 500 mg by mouth 2 (two) times daily with a meal.    [provider]  metoprolol tartrate (LOPRESSOR) 25 MG tablet Take 1 tablet (25 mg total) by mouth 2 (two) times daily. 09/03/16   Orson Eva, MD  nitroGLYCERIN (NITROSTAT) 0.4 MG SL tablet  Place 0.4 mg under the tongue every 5 (five) minutes as needed for chest pain.    [provider]  predniSONE (DELTASONE) 20 MG tablet Take 3 tablets (60 mg total) by mouth daily with breakfast. And decrease by one tablet daily 09/04/16   Tat, Shanon Brow, MD  traMADol (ULTRAM) 50 MG tablet Take 1 tablet (50 mg total) by mouth 4 (four) times daily. 09/03/16   Orson Eva, MD  traZODone (DESYREL) 50 MG tablet Take 50 mg by mouth at bedtime.    [provider]  vitamin  C (ASCORBIC ACID) 500 MG tablet Take 500 mg by mouth 2 (two) times daily.    [provider]    Physical Exam: Vitals:   09/11/16 1835 09/11/16 1845 09/11/16 1900 09/11/16 2000  BP: 106/75 100/78 93/71 98/76   Pulse: (!) 48 (!) 49 (!) 49 (!) 57  Resp: 13 13 13 13   Temp: (!) 96.3 F (35.7 C) (!) 96.4 F (35.8 C) (!) 96.6 F (35.9 C) 98.1 F (36.7 C)  SpO2: 100% 100% 100% 100%     General: Intubated, sedated Eyes:  PERRL, EOMI, normal lids, iris ENT:  grossly normal hearing, lips & tongue, mmm Neck:  no LAD, masses or thyromegaly Cardiovascular:  Bradycardia, no m/r/g. 2-3+ pitting LE edema.  Respiratory:  Diffuse rhonchorous breath sounds, but appears reasonable comfortable on the vent Abdomen:  soft, ntnd, NABS Skin:  no rash or induration seen on limited exam Musculoskeletal:  grossly normal tone BUE/BLE, good ROM, no bony abnormality Psychiatric:  Unable to perform Neurologic:  Unable to perform  Labs on Admission: I have personally reviewed following labs and imaging studies  CBC:  Recent Labs Lab 09/10/16 0040 09/11/16 1715  WBC 20.5* 29.2*  NEUTROABS 17.9* 27.7*  HGB 8.5* 8.7*  HCT 27.0* 28.4*  MCV 89.7 93.1  PLT 260 710   Basic Metabolic Panel:  Recent Labs Lab 09/10/16 0040 09/11/16 1715  NA 138 141  K 4.0 4.4  CL 101 99*  CO2 30 35*  GLUCOSE 96 260*  BUN 24* 25*  CREATININE 1.12 1.47*  CALCIUM 9.3 8.7*   GFR: Estimated Creatinine Clearance: 44.7 mL/min (A) (by C-G  formula based on SCr of 1.47 mg/dL (H)). Liver Function Tests:  Recent Labs Lab 09/10/16 0040 09/11/16 1715  AST 30 35  ALT 41 39  ALKPHOS 54 59  BILITOT 0.7 0.3  PROT 5.3* 5.5*  ALBUMIN 3.1* 2.9*   No results for input(s): LIPASE, AMYLASE in the last 168 hours. No results for input(s): AMMONIA in the last 168 hours. Coagulation Profile: No results for input(s): INR, PROTIME in the last 168 hours. Cardiac Enzymes:  Recent Labs Lab 09/10/16 0040 09/11/16 1743  TROPONINI <0.03 0.03*   BNP (last 3 results) No results for input(s): PROBNP in the last 8760 hours. HbA1C: No results for input(s): HGBA1C in the last 72 hours. CBG: No results for input(s): GLUCAP in the last 168 hours. Lipid Profile: No results for input(s): CHOL, HDL, LDLCALC, TRIG, CHOLHDL, LDLDIRECT in the last 72 hours. Thyroid Function Tests: No results for input(s): TSH, T4TOTAL, FREET4, T3FREE, THYROIDAB in the last 72 hours. Anemia Panel: No results for input(s): VITAMINB12, FOLATE, FERRITIN, TIBC, IRON, RETICCTPCT in the last 72 hours. Urine analysis:    Component Value Date/Time   COLORURINE YELLOW 09/11/2016 1702   APPEARANCEUR HAZY (A) 09/11/2016 1702   LABSPEC 1.017 09/11/2016 1702   PHURINE 5.0 09/11/2016 1702   GLUCOSEU NEGATIVE 09/11/2016 1702   HGBUR NEGATIVE 09/11/2016 1702   BILIRUBINUR NEGATIVE 09/11/2016 1702   KETONESUR NEGATIVE 09/11/2016 1702   PROTEINUR 30 (A) 09/11/2016 1702   NITRITE NEGATIVE 09/11/2016 1702   LEUKOCYTESUR NEGATIVE 09/11/2016 1702    Creatinine Clearance: Estimated Creatinine Clearance: 44.7 mL/min (A) (by C-G formula based on SCr of 1.47 mg/dL (H)).  Sepsis Labs: @LABRCNTIP (procalcitonin:4,lacticidven:4) ) Recent Results (from the past 240 hour(s))  Blood Culture (routine x 2)     Status: None (Preliminary result)   Collection Time: 09/11/16  5:15 PM  Result Value Ref Range Status  Specimen Description LEFT ANTECUBITAL  Final   Special Requests    Final    BOTTLES DRAWN AEROBIC AND ANAEROBIC Blood Culture results may not be optimal due to an inadequate volume of blood received in culture bottles   Culture PENDING  Incomplete   Report Status PENDING  Incomplete  Blood Culture (routine x 2)     Status: None (Preliminary result)   Collection Time: 09/11/16  5:15 PM  Result Value Ref Range Status   Specimen Description BLOOD LEFT HAND  Final   Special Requests   Final    BOTTLES DRAWN AEROBIC AND ANAEROBIC Blood Culture results may not be optimal due to an inadequate volume of blood received in culture bottles   Culture PENDING  Incomplete   Report Status PENDING  Incomplete     Radiological Exams on Admission: Dg Chest 2 View  Result Date: 09/10/2016 CLINICAL DATA:  Dyspnea and hypotension EXAM: CHEST  2 VIEW COMPARISON:  08/30/2016 FINDINGS: The heart size and mediastinal contours are within normal limits. Aortic atherosclerosis at the arch without aneurysm. Scarring is again noted in the right upper lobe. No pneumonic consolidation, CHF nor effusion. No pneumothorax. Bowel subchondral sclerosis of the humeral heads consistent with changes of avascular necrosis. IMPRESSION: Chronic scarring in the right upper lobe. Aortic atherosclerosis. No acute pneumonic consolidation or CHF. 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 Electronically Signed   By: Ashley Royalty M.D.   On: 09/10/2016 00:11   Dg Chest Portable 1 View  Result Date: 09/11/2016 CLINICAL DATA:  Intubation and gastric tube placement. Emesis found in airway. Hypotensive and bradycardic. EXAM: PORTABLE CHEST 1 VIEW COMPARISON:  09/09/2016 FINDINGS: The heart size and mediastinal contours are within normal limits. There is aortic atherosclerosis at the arch without aneurysm. The tip of an endotracheal tube is 7.6 cm above the carina. A gastric tube with side port is seen below the left hemidiaphragm in the expected location of the stomach, the tip projecting back up to the  fundus. Emphysematous hyperinflation of the lungs without pneumonic consolidation, effusion or pneumothorax. Chronic scarring seen at the right lung apex. The visualized skeletal structures are nonacute. 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 IMPRESSION: 1. Satisfactory support line and tube positions. 2. Emphysematous hyperinflation of the lungs without pneumonic consolidation or CHF. 3. Aortic atherosclerosis. 4. 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 Electronically Signed   By: Ashley Royalty M.D.   On: 09/11/2016 18:15    EKG: pending; per Dr. Roxanne Mins - NSR rate 60.  Boderline ST elevation in interior leads.  Assessment/Plan Active Problems:   Hypotension   AKI (acute kidney injury) ([place])   Acute respiratory failure with hypoxia (HCC)   Severe sepsis (HCC)   Diabetes mellitus, type 2 (HCC)   Avascular necrosis of bones of both hips (HCC)   Pertinent labs: ABG: 7.366/52.7/458/28.1 Troponin 0.03 Lactate 2.17 Glucose 260 BUN 25/Creatinine 1.47/GFR 47; 24/1.12/>60 on 6/5 Albumin 2.9 WBC 29.2; 20.5 on 6/5 Hgb 8.7; 8.5 on 6/5 UA basically unremarkable  Neuro/Psych:  Patient is intubated/sedated.  Based on concern for hypotension and also bradycardia (see below), sedation may be challenging.  Proprofol/Fentanyl is an option but may exacerbate hypotension and lead to need for earlier pressors.  Versed in the elderly is difficult due to how long it stays in the system.  Will defer to PCCM.  CV:  Patient seen by bradycardia <48 hours ago.  This may be indicative of the start of his infection.  He did  respond to IVF at least in part.  His pulse was 60 at that time.  Would suggest holding all BP medications including Lopressor; he may have rebound tachycardia.  Additionally, he has apparent PAF and may have HR control issues if both Lopressor and Cardizem are held - so it may be reasonable to give small IV doses of Cardizem intermittently.  Troponin is minimally elevated, suggest  trending.  Based on concern for severe sepsis with possible septic shock, it is possible to likely that he will require pressors.  Based on the severity of his illness, he is likely more appropriate for admission to [facility].  Pulm:  VDRF resulting from acute respiratory failure.  Per EMS report, the patient had marked debris suggestive of aspiration at the time of intubation.  Will need ventilator management; broad spectrum antibiotics; and likely speech therapy swallow evaluation post-intubation.  GI:  NPO, speech therapy evaluation as above.  GU:  Will need foley.  Renal:  AKI, likely contributing to hypotension.  Will need ongoing IVF and recheck in AM.  FEN: Moderate to severe protein-calorie malnutrition.   Consider nutrition consult once eating.  Endo:  Uncontrolled DM currently, possibly exacerbated by acute illness.  Hold Glucophage and cover with SSI.  Heme/Onc:  Stable anemia.  ID:  Sepsis with possible shock.  Elevated WBC count, hypothermia,  with elevated lactate to 2.17 and borderline hypotension; while awaiting blood cultures, this appears to be a preseptic condition.  Sepsis protocol initiated.  Blood and urine cultures pending. Suggest Vanc/Zosyn for now to provide broad spectrum coverage including aspiration PNA coverage.  Will need to trend lactate.  He will need aggressive pulmonary toilet.  Based on overall illness severity, he would likely be better served in a facility with 24/7 pulmonology availability.    MSK:  Marked LE edema.  Currently minimal concern for CHF as the cause for his symptoms.  Will follow.   Also with finding of B avascular necrosis of hips on CT.  This can be addressed once he is stabilized from his acute respiratory failure.  Derm:  No apparent rash.  Total critical care time: 70 minutes Critical care time was exclusive of separately billable procedures and treating other patients. Critical care was necessary to treat or prevent imminent or  life-threatening deterioration. Critical care was time spent personally by me on the following activities: development of treatment plan with patient and/or surrogate as well as nursing, discussions with consultants, evaluation of patient's response to treatment, examination of patient, obtaining history from patient or surrogate, ordering and performing treatments and interventions, ordering and review of laboratory studies, ordering and review of radiographic studies, pulse oximetry and re-evaluation of patient's condition.   Suggest transfer to PCCM service at [facility].  [facility] will be happy to assume care once he is stabilized and transferred out of the ICU.  Karmen Bongo MD [facility]  If 7PM-7AM, please contact night-coverage [email or web address] Password [email or web address]  09/11/2016, 8:21 PM

## 2016-09-11 NOTE — Progress Notes (Signed)
A Fib/A FLutter - HR 75-150  Plan Start cardizem gtt  Dr. Brand Males, M.D., [facility].C.P Pulmonary and Critical Care Medicine Staff Physician [facility] Pager: [phone], If no answer or between  15:00h - 7:00h: call [phone]  09/11/2016 11:49 PM

## 2016-09-11 NOTE — ED Provider Notes (Signed)
[facility] DEPT Provider Note   CSN: [id number] Arrival date & time: 09/11/16  1644     History   Chief Complaint Chief Complaint  Patient presents with  . Aspiration    HPI Garrett Spencer is a 71 y.o. male.  The history is provided by the nursing home and the EMS personnel. The history is limited by the condition of the patient (Unresponsive).  He was apparently found at the nursing home where he has a resident having aspirated and was completely unresponsive. He is a full code. EMS reports suctioning emesis from the pharynx.  Past Medical History:  Diagnosis Date  . Anxiety   . Arthritis   . Asthma   . Bipolar disorder ([place])   . BPH with obstruction/lower urinary tract symptoms   . Cancer ([place])   . Cognitive communication deficit   . Constipation   . COPD (chronic obstructive pulmonary disease) ([place])   . Depression   . Diabetes mellitus without complication ([place])   . Diaphragmatic hernia   . Generalized weakness   . GERD (gastroesophageal reflux disease)   . Hepatitis C   . History of pulmonary embolism   . History of shingles   . HOH (hard of hearing)   . Hyperlipidemia   . Hypertension   . Hypocalcemia   . Insomnia   . Pneumonia   . Polyneuropathy   . Torn rotator cuff    left  . Tremors of nervous system   . Vitamin D deficiency     Patient Active Problem List   Diagnosis Date Noted  . SVT (supraventricular tachycardia) ([place]) 08/30/2016  . Respiratory distress   . COPD exacerbation ([place]) 08/28/2016  . Hypotension 08/28/2016  . AKI (acute kidney injury) ([place]) 08/28/2016  . Acute respiratory failure with hypoxia ([place]) 08/28/2016  . COPD with acute exacerbation [facility])     Past Surgical History:  Procedure Laterality Date  . CATARACT EXTRACTION W/PHACO Left 12/19/2014   Procedure: CATARACT EXTRACTION PHACO AND INTRAOCULAR LENS PLACEMENT (IOC);  Surgeon: Tonny Branch, MD;  Location: [facility] ORS;  Service: Ophthalmology;  Laterality: Left;  CDE: 19.97  .  CATARACT EXTRACTION W/PHACO Right 02/20/2015   Procedure: CATARACT EXTRACTION PHACO AND INTRAOCULAR LENS PLACEMENT RIGHT EYE;  Surgeon: Tonny Branch, MD;  Location: [facility] ORS;  Service: Ophthalmology;  Laterality: Right;  16.59  . ORIF FEMUR FRACTURE Left   . TONGUE SURGERY     removal of cancer  . TRANSURETHRAL RESECTION OF PROSTATE         Home Medications    Prior to Admission medications   Medication Sig Start Date End Date Taking? Authorizing Provider  albuterol (PROVENTIL) (2.5 MG/3ML) 0.083% nebulizer solution Take 2.5 mg by nebulization 4 (four) times daily.    [provider]  ARIPiprazole (ABILIFY) 5 MG tablet Take 5 mg by mouth daily.    [provider]  aspirin 81 MG chewable tablet Chew 1 tablet (81 mg total) by mouth daily. 09/04/16   Orson Eva, MD  atorvastatin (LIPITOR) 10 MG tablet Take 10 mg by mouth daily.    [provider]  baclofen (LIORESAL) 10 MG tablet Take 5 mg by mouth 3 (three) times daily. 08/15/16   [provider]  budesonide-formoterol (SYMBICORT) 160-4.5 MCG/ACT inhaler Inhale 2 puffs into the lungs 2 (two) times daily.    [provider]  calcium-vitamin D (OSCAL WITH D) 500-200 MG-UNIT per tablet Take 1 tablet by mouth daily with breakfast.     [provider]  clonazePAM (KLONOPIN) 2 MG tablet Take 2 mg by mouth 2 (two) times daily.    [provider]  clonazePAM (KLONOPIN) 2 MG tablet Take 1 tablet (2 mg total) by mouth 2 (two) times daily. 09/03/16   Orson Eva, MD  diltiazem (CARDIZEM CD) 240 MG 24 hr capsule Take 1 capsule (240 mg total) by mouth daily. 09/04/16   Orson Eva, MD  ferrous sulfate 325 (65 FE) MG tablet Take 325 mg by mouth 2 (two) times daily with a meal.    [provider]  finasteride (PROSCAR) 5 MG tablet Take 5 mg by mouth daily.    [provider]  gabapentin (NEURONTIN) 100 MG capsule Take 1 capsule by mouth 3 (three) times daily. 08/15/16   [provider]  INCRUSE ELLIPTA 62.5 MCG/INH AEPB Inhale 1 puff into the lungs daily. 07/22/16   [provider]  isosorbide mononitrate (IMDUR) 30 MG 24 hr tablet Take 30 mg by mouth daily.    [provider]  LORazepam (ATIVAN) 1 MG tablet Take 1 mg by mouth every 12 (twelve) hours as needed for anxiety.    [provider]  LORazepam (ATIVAN) 1 MG tablet Take 1 tablet (1 mg total) by mouth every 8 (eight) hours as needed for anxiety. 09/03/16   Orson Eva, MD  metFORMIN (GLUCOPHAGE) 500 MG tablet Take 500 mg by mouth 2 (two) times daily with a meal.    [provider]  metoprolol tartrate (LOPRESSOR) 25 MG tablet Take 1 tablet (25 mg total) by mouth 2 (two) times daily. 09/03/16   Orson Eva, MD  nitroGLYCERIN (NITROSTAT) 0.4 MG SL tablet Place 0.4 mg under the tongue every 5 (five) minutes as needed for chest pain.    [provider]  predniSONE (DELTASONE) 20 MG tablet Take 3 tablets (60 mg total) by mouth daily with breakfast. And decrease by one tablet daily 09/04/16   Tat, Shanon Brow, MD  traMADol (ULTRAM) 50 MG tablet Take 50 mg by mouth 4 (four) times daily.     [provider]  traMADol (ULTRAM) 50 MG tablet Take 1 tablet (50 mg total) by mouth 4 (four) times daily. 09/03/16   Orson Eva, MD  traZODone (DESYREL) 50 MG tablet Take 50 mg by mouth at bedtime.    [provider]  vitamin C (ASCORBIC ACID) 500 MG tablet Take 500 mg by mouth 2 (two) times daily.    [provider]    Family History History reviewed. No pertinent family history.  Social History Social History  Substance Use Topics  . Smoking status: Former Smoker    Packs/day: 1.50    Years: 45.00    Types: Cigarettes    Quit date: 12/14/2012  . Smokeless tobacco: Never Used  . Alcohol use No     Allergies   Benadryl [diphenhydramine]; Haldol [haloperidol]; and Shellfish allergy   Review of Systems Review of Systems  Unable to perform ROS: Patient  unresponsive     Physical Exam Updated Vital Signs BP 93/71   Pulse (!) 49   Temp (!) 96.6 F (35.9 C)   Resp 13   SpO2 100%   Physical Exam  Nursing note and vitals reviewed.  71 year old male unresponsive and with pre-agonal respirations. Vital signs are significant for bradycardia. Oxygen saturation is 100%, which is normal. Head is normocephalic and atraumatic. Pupils are 3 mm with postsurgical changes. Oropharynx is clear. He has no gag reflex. Respirations are gurgling. Neck  is nontender and supple without adenopathy or JVD. Back is nontender and there is no CVA tenderness. Lungs have markedly diminished air flow without rales, wheezes, or rhonchi. Chest is nontender. Heart has regular rate and rhythm without murmur. Abdomen is soft, flat, nontender without masses or hepatosplenomegaly and peristalsis is normoactive. Extremities have 2+ edema, full range of motion is present. Skin is warm and dry without rash. Neurologic: He is unresponsive to painful stimuli and shows no spontaneous movement.  ED Treatments / Results  Labs (all labs ordered are listed, but only abnormal results are displayed) Labs Reviewed  COMPREHENSIVE METABOLIC PANEL - Abnormal; Notable for the following:       Result Value   Chloride 99 (*)    CO2 35 (*)    Glucose, Bld 260 (*)    BUN 25 (*)    Creatinine, Ser 1.47 (*)    Calcium 8.7 (*)    Total Protein 5.5 (*)    Albumin 2.9 (*)    GFR calc non Af Amer 47 (*)    GFR calc Af Amer 54 (*)    All other components within normal limits  CBC WITH DIFFERENTIAL/PLATELET - Abnormal; Notable for the following:    WBC 29.2 (*)    RBC 3.05 (*)    Hemoglobin 8.7 (*)    HCT 28.4 (*)    RDW 17.1 (*)    Neutro Abs 27.7 (*)    Lymphs Abs 0.6 (*)    All other components within normal limits  URINALYSIS, ROUTINE W REFLEX MICROSCOPIC - Abnormal; Notable for the following:    APPearance HAZY (*)    Protein, ur 30 (*)    Bacteria, UA RARE (*)     Squamous Epithelial / LPF 0-5 (*)    All other components within normal limits  BLOOD GAS, ARTERIAL - Abnormal; Notable for the following:    pCO2 arterial 52.7 (*)    pO2, Arterial 458 (*)    Bicarbonate 28.1 (*)    Acid-Base Excess 4.4 (*)    All other components within normal limits  TROPONIN I - Abnormal; Notable for the following:    Troponin I 0.03 (*)    All other components within normal limits  I-STAT CG4 LACTIC ACID, ED - Abnormal; Notable for the following:    Lactic Acid, Venous 2.17 (*)    All other components within normal limits  CULTURE, BLOOD (ROUTINE X 2)  CULTURE, BLOOD (ROUTINE X 2)  URINE CULTURE  I-STAT CG4 LACTIC ACID, ED    EKG ECG shows normal sinus rhythm with a rate of 60, no ectopy. Normal axis. Normal P wave. Normal QRS. Normal intervals. Borderline ST elevation in inferior leads. Impression: borderline ECG.when compared with ECG of 09/01/2016, sinus rhythm has replaced atrial fibrillation.  Radiology Dg Chest 2 View  Result Date: 09/10/2016 CLINICAL DATA:  Dyspnea and hypotension EXAM: CHEST  2 VIEW COMPARISON:  08/30/2016 FINDINGS: The heart size and mediastinal contours are within normal limits. Aortic atherosclerosis at the arch without aneurysm. Scarring is again noted in the right upper lobe. No pneumonic consolidation, CHF nor effusion. No pneumothorax. Bowel subchondral sclerosis of the humeral heads consistent with changes of avascular necrosis. IMPRESSION: Chronic scarring in the right upper lobe. Aortic atherosclerosis. No acute pneumonic consolidation or CHF. 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 Electronically Signed   By: Ashley Royalty M.D.   On: 09/10/2016 00:11   Dg Chest Portable 1 View  Result Date: 09/11/2016 CLINICAL DATA:  Intubation and gastric tube placement. Emesis found in airway. Hypotensive and bradycardic. EXAM: PORTABLE CHEST 1 VIEW COMPARISON:  09/09/2016 FINDINGS: The heart size and mediastinal contours are within  normal limits. There is aortic atherosclerosis at the arch without aneurysm. The tip of an endotracheal tube is 7.6 cm above the carina. A gastric tube with side port is seen below the left hemidiaphragm in the expected location of the stomach, the tip projecting back up to the fundus. Emphysematous hyperinflation of the lungs without pneumonic consolidation, effusion or pneumothorax. Chronic scarring seen at the right lung apex. The visualized skeletal structures are nonacute. 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 IMPRESSION: 1. Satisfactory support line and tube positions. 2. Emphysematous hyperinflation of the lungs without pneumonic consolidation or CHF. 3. Aortic atherosclerosis. 4. 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 Electronically Signed   By: Ashley Royalty M.D.   On: 09/11/2016 18:15    Procedures Procedure Name: Intubation Date/Time: 09/11/2016 5:17 PM Performed by: Roxanne Mins, Lori Popowski Pre-anesthesia Checklist: Patient identified, Emergency Drugs available, Suction available, Patient being monitored and Timeout performed Oxygen Delivery Method: Ambu bag Preoxygenation: Pre-oxygenation with 100% oxygen Intubation Type: IV induction, Rapid sequence and Cricoid Pressure applied Ventilation: Mask ventilation without difficulty Grade View: Grade II Tube size: 7.5 mm Number of attempts: 1 Airway Equipment and Method: Stylet Placement Confirmation: ETT inserted through vocal cords under direct vision,  Positive ETCO2 and Breath sounds checked- equal and bilateral Secured at: 25 cm Tube secured with: ETT holder Dental Injury: Teeth and Oropharynx as per pre-operative assessment       CRITICAL CARE Performed by: ELFYB,OFBPZ Total critical care time: 180 minutes Critical care time was exclusive of separately billable procedures and treating other patients. Critical care was necessary to treat or prevent imminent or life-threatening deterioration. Critical care was time spent  personally by me on the following activities: development of treatment plan with patient and/or surrogate as well as nursing, discussions with consultants, evaluation of patient's response to treatment, examination of patient, obtaining history from patient or surrogate, ordering and performing treatments and interventions, ordering and review of laboratory studies, ordering and review of radiographic studies, pulse oximetry and re-evaluation of patient's condition.   Medications Ordered in ED Medications  rocuronium (ZEMURON) injection 80 mg (80 mg Intravenous Given by Other 09/11/16 1656)  etomidate (AMIDATE) injection 20 mg (20 mg Intravenous Given by Other 09/11/16 1655)  ceFEPIme (MAXIPIME) 2 g in dextrose 5 % 50 mL IVPB (0 g Intravenous Stopped 09/11/16 1855)  sodium chloride 0.9 % bolus 1,000 mL (0 mLs Intravenous Stopped 09/11/16 1800)    And  sodium chloride 0.9 % bolus 1,000 mL (0 mLs Intravenous Stopped 09/11/16 1856)    And  sodium chloride 0.9 % bolus 250 mL (0 mLs Intravenous Stopped 09/11/16 1904)  vancomycin (VANCOCIN) 1,250 mg in sodium chloride 0.9 % 250 mL IVPB (0 mg Intravenous Stopped 09/11/16 1904)     Initial Impression / Assessment and Plan / ED Course  I have reviewed the triage vital signs and the nursing notes.  Pertinent labs & imaging results that were available during my care of the patient were reviewed by me and considered in my medical decision making (see chart for details).  Patient presents with preagonal respirations following apparent aspiration. He is unable to protect his airway, so he was electively intubated. He did demonstrate some hypotension and is started on sepsis pathway and is started on antibiotics for healthcare associated pneumonia. He is given early goal-directed fluid  therapy. Cardiac monitor shows atrial fibrillation of. He is not on any anticoagulants, so will be sent for CT of head. If no intracranial bleeding, will start on heparin. Old records are  reviewed, and he was in the ED 2 days ago with dyspnea, and admitted to the hospital 2 weeks ago with a COPD exacerbation.  Blood pressure has stabilized. Patient has also converted to sinus rhythm. ECG obtained following conversion, but no ECG was obtained while he was in atrial fibrillation. However, review of prior ECGs showed prior episodes of atrial fibrillation.  Lactic acid level has come back minimally elevated. He is already received antibiotics and early goal-directed fluid therapy with normalization of blood pressure. He is noted to be markedly hypothermic and is started on external warming. Case is discussed with Dr. Lorin Mercy of [facility] who is concerned that his case is too complicated to be managed effectively here. She is suggesting that he should be transferred to [facility] for management by critical care team there.  Patient has become hypotensive and is given additional IV fluids. Cases been discussed with Dr. Oletta Darter of pulmonary critical care medicine who agrees to accept the patient in transfer her to [facility].  Blood pressure has normalized. Patient is starting to become more awake and fighting the ventilator. He is given Mydriacyl and fentanyl for sedation for the ambulance ride. CT of head has come back showing no evidence of bleeding. Orders are placed to started on heparin because of atrial fibrillation.  CHA2DS2/VAS Stroke Risk Points   3 points: High risk 1 point for age 58-74, 1 point for hypertension history, 1 point for diabetes history.    This score determines the patient's risk of having a stroke if the  patient has atrial fibrillation.        Final Clinical Impressions(s) / ED Diagnoses   Final diagnoses:  Acute respiratory failure, unspecified whether with hypoxia or hypercapnia (HCC)  Aspiration pneumonia, unspecified aspiration pneumonia type, unspecified laterality, unspecified part of lung (HCC)  Paroxysmal atrial fibrillation  ([place])  Glasgow coma scale total score 3-8, in the field (EMT or ambulance) ([place])  Acute kidney injury (nontraumatic) ([place])  Hypothermia, initial encounter  Normochromic normocytic anemia  Elevated lactic acid level  Elevated troponin I level    New Prescriptions New Prescriptions   No medications on file     Delora Fuel, MD 58/85/02 2137

## 2016-09-11 NOTE — ED Notes (Signed)
Intubation done by [facility] paramedic Zenia Resides with 7.5 ET with color change.  ET at 25 cm at lip.

## 2016-09-11 NOTE — ED Notes (Signed)
CRITICAL VALUE ALERT  Critical Value:  Trop 0.03  Date & Time Notied:  09/11/16 1943  Provider Notified: Roxanne Mins  Orders Received/Actions taken: NA

## 2016-09-11 NOTE — Progress Notes (Signed)
ANTICOAGULATION CONSULT NOTE - Initial Consult  Pharmacy Consult for Heparin Indication: atrial fibrillation  Allergies  Allergen Reactions  . Benadryl [Diphenhydramine]     unknown  . Haldol [Haloperidol] Other (See Comments)    unknown  . Shellfish Allergy Other (See Comments)    unknown    Patient Measurements:      Heparin dosing weight = 67.6 kg  Vital Signs: Temp: 100.8 F (38.2 C) (06/06 2130) BP: 102/70 (06/06 2130) Pulse Rate: 83 (06/06 2130)  Labs:  Recent Labs  09/10/16 0040 09/11/16 1715 09/11/16 1743  HGB 8.5* 8.7*  --   HCT 27.0* 28.4*  --   PLT 260 301  --   CREATININE 1.12 1.47*  --   TROPONINI <0.03  --  0.03*    Estimated Creatinine Clearance: 44.7 mL/min (A) (by C-G formula based on SCr of 1.47 mg/dL (H)).   Medical History: Past Medical History:  Diagnosis Date  . Anxiety   . Arthritis   . Asthma   . Bipolar disorder ([place])   . BPH with obstruction/lower urinary tract symptoms   . Cancer ([place])   . Cognitive communication deficit   . Constipation   . COPD (chronic obstructive pulmonary disease) ([place])   . Depression   . Diabetes mellitus without complication ([place])   . Diaphragmatic hernia   . Generalized weakness   . GERD (gastroesophageal reflux disease)   . Hepatitis C   . History of pulmonary embolism   . History of shingles   . HOH (hard of hearing)   . Hyperlipidemia   . Hypertension   . Hypocalcemia   . Insomnia   . Pneumonia   . Polyneuropathy   . Torn rotator cuff    left  . Tremors of nervous system   . Vitamin D deficiency     Medications:   (Not in a hospital admission)  Reviewed, not on an anticoagulant per home med list.  Assessment: Okay for Protocol , low Hg.  CT (-) acute findings.  Heparin for Afib.  Baseline anticoag labs pending.  Goal of Therapy:  Heparin level 0.3-0.7 units/ml Monitor platelets by anticoagulation protocol: Yes   Plan:  Give 3000 units bolus x 1 Start heparin infusion at 950  units/hr Check anti-Xa level in 6-8 hours and daily while on heparin Continue to monitor H&H and platelets  Pricilla Larsson 09/11/2016,9:38 PM

## 2016-09-11 NOTE — ED Notes (Signed)
Plan to intubate

## 2016-09-11 NOTE — Progress Notes (Signed)
Alice LPN from [facility] facility called to give report on pt. Report received from [facility] ED nurse prior to pt. Arriving to [facility] via [facility]. Explained that report had already been received from [facility] ED to [place] in which she was asking for admitting diagnosis. Would not give her last name when asked. No information provided to her.

## 2016-09-11 NOTE — ED Notes (Signed)
CRITICAL VALUE ALERT  Critical Value:  Lactic Acid 2.17  Date & Time Notied:  09/11/16 1753  Provider Notified: Roxanne Mins MD  Orders Received/Actions taken: none

## 2016-09-11 NOTE — Progress Notes (Signed)
Pharmacy Antibiotic Note  Garrett Spencer is a 71 y.o. male admitted on 09/11/2016 with pneumonia.  Pharmacy has been consulted for Vancomycin and Cefepime dosing.  Initial dose given in ED.  Plan: Vancomycin 1gm IV every 24 hours.  Goal trough 15-20 mcg/mL.  Cefepime 2gm IV every 24 hours. Monitor labs, micro and vitals.     Temp (24hrs), Avg:96.8 F (36 C), Min:93.9 F (34.4 C), Max:99.5 F (37.5 C)   Recent Labs Lab 09/10/16 0040 09/11/16 1715 09/11/16 1732 09/11/16 2035  WBC 20.5* 29.2*  --   --   CREATININE 1.12 1.47*  --   --   LATICACIDVEN 0.9  --  2.17* 2.03*    Estimated Creatinine Clearance: 44.7 mL/min (A) (by C-G formula based on SCr of 1.47 mg/dL (H)).    Allergies  Allergen Reactions  . Benadryl [Diphenhydramine]     unknown  . Haldol [Haloperidol] Other (See Comments)    unknown  . Shellfish Allergy Other (See Comments)    unknown    Antimicrobials this admission: Vanc 6/6 >>  Cefepime 6/6 >>   Dose adjustments this admission: n/a   Microbiology results: 6/6 BCx: pending 6/6 UCx: pending  6//6 Sputum: pending  6/6 MRSA PCR: pending  Thank you for allowing pharmacy to be a part of this patient's care.  Pricilla Larsson 09/11/2016 9:29 PM

## 2016-09-11 NOTE — ED Triage Notes (Signed)
Reported that pt is full code.

## 2016-09-12 ENCOUNTER — Other Ambulatory Visit: Payer: Self-pay

## 2016-09-12 ENCOUNTER — Inpatient Hospital Stay (HOSPITAL_COMMUNITY): Payer: Medicare Other

## 2016-09-12 DIAGNOSIS — N179 Acute kidney failure, unspecified: Secondary | ICD-10-CM

## 2016-09-12 DIAGNOSIS — J189 Pneumonia, unspecified organism: Secondary | ICD-10-CM

## 2016-09-12 DIAGNOSIS — R0600 Dyspnea, unspecified: Secondary | ICD-10-CM

## 2016-09-12 DIAGNOSIS — J9601 Acute respiratory failure with hypoxia: Secondary | ICD-10-CM

## 2016-09-12 DIAGNOSIS — Y95 Nosocomial condition: Secondary | ICD-10-CM

## 2016-09-12 LAB — BLOOD GAS, ARTERIAL
Acid-base deficit: 1 mmol/L (ref 0.0–2.0)
Bicarbonate: 25 mmol/L (ref 20.0–28.0)
DRAWN BY: 249101
FIO2: 50
O2 Saturation: 97.9 %
PCO2 ART: 56.4 mmHg — AB (ref 32.0–48.0)
PEEP: 5 cmH2O
Patient temperature: 98.6
RATE: 16 resp/min
VT: 500 mL
pH, Arterial: 7.269 — ABNORMAL LOW (ref 7.350–7.450)
pO2, Arterial: 111 mmHg — ABNORMAL HIGH (ref 83.0–108.0)

## 2016-09-12 LAB — BASIC METABOLIC PANEL
Anion gap: 6 (ref 5–15)
BUN: 28 mg/dL — AB (ref 6–20)
CALCIUM: 7.3 mg/dL — AB (ref 8.9–10.3)
CHLORIDE: 111 mmol/L (ref 101–111)
CO2: 23 mmol/L (ref 22–32)
CREATININE: 1.62 mg/dL — AB (ref 0.61–1.24)
GFR calc Af Amer: 48 mL/min — ABNORMAL LOW (ref >60)
GFR calc non Af Amer: 41 mL/min — ABNORMAL LOW (ref >60)
Glucose, Bld: 77 mg/dL (ref 65–99)
Potassium: 4.7 mmol/L (ref 3.5–5.1)
Sodium: 140 mmol/L (ref 135–145)

## 2016-09-12 LAB — ECHOCARDIOGRAM COMPLETE
Height: 71 in
WEIGHTICAEL: 2391.55 [oz_av]
Weight: 2522.06 oz

## 2016-09-12 LAB — POCT I-STAT 3, ART BLOOD GAS (G3+)
Acid-base deficit: 3 mmol/L — ABNORMAL HIGH (ref 0.0–2.0)
Acid-base deficit: 3 mmol/L — ABNORMAL HIGH (ref 0.0–2.0)
Bicarbonate: 24.2 mmol/L (ref 20.0–28.0)
Bicarbonate: 23.8 mmol/L (ref 20.0–28.0)
O2 SAT: 93 %
O2 SAT: 95 %
PCO2 ART: 51.8 mmHg — AB (ref 32.0–48.0)
PH ART: 7.279 — AB (ref 7.350–7.450)
Patient temperature: 37.2
TCO2: 26 mmol/L (ref 0–100)
TCO2: 25 mmol/L (ref 0–100)
pCO2 arterial: 49.8 mmHg — ABNORMAL HIGH (ref 32.0–48.0)
pH, Arterial: 7.285 — ABNORMAL LOW (ref 7.350–7.450)
pO2, Arterial: 79 mmHg — ABNORMAL LOW (ref 83.0–108.0)
pO2, Arterial: 81 mmHg — ABNORMAL LOW (ref 83.0–108.0)

## 2016-09-12 LAB — PHOSPHORUS: Phosphorus: 3.8 mg/dL (ref 2.5–4.6)

## 2016-09-12 LAB — CBC
HCT: 29.3 % — ABNORMAL LOW (ref 39.0–52.0)
Hemoglobin: 8.9 g/dL — ABNORMAL LOW (ref 13.0–17.0)
MCH: 28.3 pg (ref 26.0–34.0)
MCHC: 30.4 g/dL (ref 30.0–36.0)
MCV: 93 fL (ref 78.0–100.0)
Platelets: 238 10*3/uL (ref 150–400)
RBC: 3.15 MIL/uL — ABNORMAL LOW (ref 4.22–5.81)
RDW: 17.6 % — AB (ref 11.5–15.5)
WBC: 29.1 10*3/uL — ABNORMAL HIGH (ref 4.0–10.5)

## 2016-09-12 LAB — GLUCOSE, CAPILLARY
GLUCOSE-CAPILLARY: 78 mg/dL (ref 65–99)
GLUCOSE-CAPILLARY: 77 mg/dL (ref 65–99)
GLUCOSE-CAPILLARY: 80 mg/dL (ref 65–99)
Glucose-Capillary: 106 mg/dL — ABNORMAL HIGH (ref 65–99)
Glucose-Capillary: 113 mg/dL — ABNORMAL HIGH (ref 65–99)
Glucose-Capillary: 83 mg/dL (ref 65–99)
Glucose-Capillary: 96 mg/dL (ref 65–99)

## 2016-09-12 LAB — TRIGLYCERIDES: TRIGLYCERIDES: 77 mg/dL (ref <150)

## 2016-09-12 LAB — HEPARIN LEVEL (UNFRACTIONATED)
HEPARIN UNFRACTIONATED: 0.86 [IU]/mL — AB (ref 0.30–0.70)
HEPARIN UNFRACTIONATED: 0.43 [IU]/mL (ref 0.30–0.70)

## 2016-09-12 LAB — PROCALCITONIN: PROCALCITONIN: 3.77 ng/mL

## 2016-09-12 LAB — MAGNESIUM: MAGNESIUM: 1 mg/dL — AB (ref 1.7–2.4)

## 2016-09-12 LAB — MRSA PCR SCREENING: MRSA by PCR: NEGATIVE

## 2016-09-12 LAB — TSH: TSH: 1.403 u[IU]/mL (ref 0.350–4.500)

## 2016-09-12 MED ORDER — SODIUM CHLORIDE 0.9 % IV SOLN
0.0000 ug/min | INTRAVENOUS | Status: DC
  Administered 2016-09-12: 15 ug/min via INTRAVENOUS
  Administered 2016-09-12: 140 ug/min via INTRAVENOUS
  Administered 2016-09-12: 130 ug/min via INTRAVENOUS
  Administered 2016-09-12: 90 ug/min via INTRAVENOUS
  Administered 2016-09-12: 130 ug/min via INTRAVENOUS
  Filled 2016-09-12 (×9): qty 1

## 2016-09-12 MED ORDER — CHLORHEXIDINE GLUCONATE CLOTH 2 % EX PADS
6.0000 | MEDICATED_PAD | Freq: Every day | CUTANEOUS | Status: DC
  Administered 2016-09-13 – 2016-09-16 (×4): 6 via TOPICAL

## 2016-09-12 MED ORDER — SODIUM CHLORIDE 0.9 % IV SOLN
0.0000 ug/min | INTRAVENOUS | Status: DC
  Administered 2016-09-12: 125 ug/min via INTRAVENOUS
  Administered 2016-09-12: 230 ug/min via INTRAVENOUS
  Administered 2016-09-13: 125 ug/min via INTRAVENOUS
  Administered 2016-09-13: 50 ug/min via INTRAVENOUS
  Filled 2016-09-12 (×5): qty 4

## 2016-09-12 MED ORDER — IPRATROPIUM BROMIDE 0.02 % IN SOLN
0.5000 mg | Freq: Four times a day (QID) | RESPIRATORY_TRACT | Status: DC
  Administered 2016-09-12 – 2016-09-14 (×12): 0.5 mg via RESPIRATORY_TRACT
  Filled 2016-09-12 (×12): qty 2.5

## 2016-09-12 MED ORDER — ATROPINE SULFATE 1 MG/10ML IJ SOSY
PREFILLED_SYRINGE | INTRAMUSCULAR | Status: AC
  Filled 2016-09-12: qty 10

## 2016-09-12 MED ORDER — SODIUM CHLORIDE 0.9 % IV BOLUS (SEPSIS)
500.0000 mL | Freq: Once | INTRAVENOUS | Status: AC
  Administered 2016-09-12: 500 mL via INTRAVENOUS

## 2016-09-12 MED ORDER — ORAL CARE MOUTH RINSE
15.0000 mL | Freq: Four times a day (QID) | OROMUCOSAL | Status: DC
  Administered 2016-09-12 – 2016-09-19 (×28): 15 mL via OROMUCOSAL

## 2016-09-12 MED ORDER — SODIUM CHLORIDE 0.9 % IV BOLUS (SEPSIS)
1000.0000 mL | Freq: Once | INTRAVENOUS | Status: AC
  Administered 2016-09-12: 1000 mL via INTRAVENOUS

## 2016-09-12 MED ORDER — CHLORHEXIDINE GLUCONATE 0.12% ORAL RINSE (MEDLINE KIT)
15.0000 mL | Freq: Two times a day (BID) | OROMUCOSAL | Status: DC
  Administered 2016-09-12 – 2016-09-19 (×16): 15 mL via OROMUCOSAL

## 2016-09-12 MED ORDER — MAGNESIUM SULFATE 4 GM/100ML IV SOLN
4.0000 g | Freq: Once | INTRAVENOUS | Status: AC
  Administered 2016-09-12: 4 g via INTRAVENOUS
  Filled 2016-09-12: qty 100

## 2016-09-12 MED ORDER — ACETAMINOPHEN 160 MG/5ML PO SOLN
650.0000 mg | Freq: Four times a day (QID) | ORAL | Status: AC | PRN
  Administered 2016-09-12: 650 mg
  Filled 2016-09-12: qty 20.3

## 2016-09-12 MED ORDER — PROPOFOL 1000 MG/100ML IV EMUL
0.0000 ug/kg/min | INTRAVENOUS | Status: DC
  Administered 2016-09-12: 5 ug/kg/min via INTRAVENOUS
  Administered 2016-09-12: 30 ug/kg/min via INTRAVENOUS
  Administered 2016-09-13: 40 ug/kg/min via INTRAVENOUS
  Administered 2016-09-13: 25 ug/kg/min via INTRAVENOUS
  Filled 2016-09-12 (×4): qty 100

## 2016-09-12 NOTE — Progress Notes (Signed)
Now hypotensive despite sedation off and cardizem off  Plan 500cc fluid bolus Neo if needed for MAP > 65  Dr. Brand Males, M.D., [facility].C.P Pulmonary and Critical Care Medicine Staff Physician [facility] Pager: [phone], If no answer or between  15:00h - 7:00h: call [phone]  09/12/2016 5:52 AM

## 2016-09-12 NOTE — Progress Notes (Signed)
   Febrile 102.5  Plan Tylenol x 1 day x q6h prn for fever  Dr. Brand Males, M.D., [facility].C.P Pulmonary and Critical Care Medicine Staff Physician [facility] Pager: [phone], If no answer or between  15:00h - 7:00h: call [phone]  09/12/2016 3:54 AM

## 2016-09-12 NOTE — Progress Notes (Signed)
[facility] Progress Note Patient Name: Garrett Spencer DOB: [birth year] MRN: [id number]   Date of Service  09/12/2016  HPI/Events of Note  Oliguria - Bladder scan with 30 mL residual. Hgb = 8.7. No CVL. LVEF = 55-60%.  eICU Interventions  Will order: 1. Bolus with 0.9 NaCl 1 liter IV over 1 hour now.      Intervention Category Intermediate Interventions: Oliguria - evaluation and management  Estill Llerena Eugene 09/12/2016, 3:19 PM mL

## 2016-09-12 NOTE — Procedures (Signed)
Central Venous Catheter Insertion Procedure Note Garrett Spencer [id number] 28-Sep-1945  Procedure: Insertion of Central Venous Catheter Indications: Assessment of intravascular volume, Drug and/or fluid administration and Frequent blood sampling  Procedure Details Consent: Unable to obtain consent because of altered level of consciousness. Time Out: Verified patient identification, verified procedure, site/side was marked, verified correct patient position, special equipment/implants available, medications/allergies/relevent history reviewed, required imaging and test results available.  Performed  Maximum sterile technique was used including antiseptics, cap, gloves, gown, hand hygiene, mask and sheet. Skin prep: Chlorhexidine; local anesthetic administered A antimicrobial bonded/coated triple lumen catheter was placed in the left internal jugular vein using the Seldinger technique.  Evaluation Blood flow good Complications: No apparent complications Patient did tolerate procedure well. Chest X-ray ordered to verify placement.  CXR: pending.  Procedure performed under direct ultrasound guidance for real time vessel cannulation.      Garrett Spencer, [facility] Pager: [phone]  or [phone] 09/12/2016, 4:34 PM  Baltazar Apo, MD, PhD 09/13/2016, 2:30 PM [facility] [phone] or if no answer [phone]

## 2016-09-12 NOTE — Progress Notes (Signed)
  Echocardiogram 2D Echocardiogram has been performed. Patient noncompliant and ventilator.   Garrett Spencer 09/12/2016, 2:01 PM

## 2016-09-12 NOTE — Progress Notes (Signed)
Initial Nutrition Assessment  DOCUMENTATION CODES:   Non-severe (moderate) malnutrition in context of chronic illness  INTERVENTION:   -If unable to extubate within 24 hours, recommend initiation of tube feeding via OG tube  -TF recommendations:   Vital AF 1.2 @ goal of 70 ml/hr providing 126 g of protein, 2016 kcals, 1361 mL of free water. Appropriate for PEPuP.    NUTRITION DIAGNOSIS:   Malnutrition (Moderate ) related to chronic illness as evidenced by mild depletion of body fat, mild depletion of muscle mass.  GOAL:   Provide needs based on ASPEN/SCCM guidelines  MONITOR:   Vent status, Labs, Weight trends, TF tolerance  REASON FOR ASSESSMENT:   Ventilator    ASSESSMENT:   71 yo male admitted with acute respiratory failure secondary to aspiration, HCAP; AKI, hypotension likely due to sepsis. Pt with hx of COPD, DM, diaphragmatic hernia, GERD, hepatitis C, HTN, HL, anxiety/depression/bipolar disorder, cognitive communication deficit   Patient is currently intubated on ventilator support, fentanyl for sedation, on phenylephrine, febrile MV: 10.5 L/min Temp (24hrs), Avg:97.9 F (36.6 C), Min:93.9 F (34.4 C), Max:102.2 F (39 C)  OG tube with tip in stomach  Nutrition-Focused physical exam completed. Findings are mild fat depletion, mild/moderate muscle depletion, and moderate edema.   Labs: reviewed Meds: reviewed  Diet Order:  Diet NPO time specified  Skin:  Reviewed, no issues  Last BM:  no documented BM  Height:   Ht Readings from Last 1 Encounters:  09/09/16 5\' 11"  (1.803 m)    Weight:   Wt Readings from Last 1 Encounters:  09/12/16 157 lb 10.1 oz (71.5 kg)   BMI:  Body mass index is 21.98 kg/m.  Estimated Nutritional Needs:   Kcal:  2059 kcals  Protein:  107-143 g  Fluid:  >/= 2 L  EDUCATION NEEDS:   No education needs identified at this time  [place], [place], LDN [phone] Pager  [phone] Weekend/On-Call  Pager

## 2016-09-12 NOTE — Progress Notes (Signed)
ANTICOAGULATION CONSULT NOTE - Follow up [place] for Heparin Indication: atrial fibrillation  Allergies  Allergen Reactions  . Benadryl [Diphenhydramine]     unknown  . Haldol [Haloperidol] Other (See Comments)    unknown  . Shellfish Allergy Other (See Comments)    unknown    Patient Measurements: Weight: 157 lb 10.1 oz (71.5 kg) Heparin dosing weight: 67.6 kg  Vital Signs: Temp: 97 F (36.1 C) (06/07 2000) BP: 117/63 (06/07 2000) Pulse Rate: 53 (06/07 2000)  Labs:  Recent Labs  09/10/16 0040 09/11/16 1715 09/11/16 1743 09/11/16 2125 09/12/16 0721 09/12/16 0948 09/12/16 1920  HGB 8.5* 8.7*  --   --   --  8.9*  --   HCT 27.0* 28.4*  --   --   --  29.3*  --   PLT 260 301  --   --   --  238  --   APTT  --   --   --  26  --   --   --   LABPROT  --   --   --  13.3  --   --   --   INR  --   --   --  1.01  --   --   --   HEPARINUNFRC  --   --   --   --  0.86*  --  0.43  CREATININE 1.12 1.47*  --   --   --  1.62*  --   TROPONINI <0.03  --  0.03*  --   --   --   --     Estimated Creatinine Clearance: 42.9 mL/min (A) (by C-G formula based on SCr of 1.62 mg/dL (H)).  Reviewed, not on an anticoagulant per home med list.  Assessment: 71 yo male admitted with hypotension and AF with RVR, pharmacy consulted to start heparin. Initial heparin level SUPRAtherapeutic (0.86). And heparin rate was reduced earlier today.  Heparin level is now 0.43, therapeutic on 800 units/hr  Goal of Therapy:  Heparin level 0.3-0.7 units/ml Monitor platelets by anticoagulation protocol: Yes   Plan:  Continue IV heparin at current rate F/u AM labs Continue to monitor H&H and platelets   Maryanna Shape, PharmD, BCPS  Clinical Pharmacist  Pager: [phone]   09/12/2016,9:27 PM

## 2016-09-12 NOTE — Progress Notes (Signed)
PCCM Interval Progress Note  Asked to assess pt at bedside by RN due to bradycardia.  Admitted overnight from SNF with acute hypoxic and hypercapnic respiratory failure with concern for aspiration vs HCAP.  Placed on broad spectrum abx.  Overnight developed A.fib > started on cardizem.  Later spiked fever and became hypotensive > started on neo and cardizem stopped.  This morning, has A.fib with occasional A.flutter on EKG with low voltage.  HR fluctuating from 60's to low 40's.   He is sedated on vent; however, is awake and opens eyes to voice, appears uncomfortable.  VITAL SIGNS: Temp:  [93.9 F (34.4 C)-102.2 F (39 C)] 99.5 F (37.5 C) (06/07 0700) Pulse Rate:  [48-105] 63 (06/07 0700) Resp:  [11-33] 16 (06/07 0700) BP: (62-129)/(44-90) 67/52 (06/07 0700) SpO2:  [98 %-100 %] 99 % (06/07 0700) FiO2 (%):  [50 %-100 %] 50 % (06/07 0231) Weight:  [71.5 kg (157 lb 10.1 oz)] 71.5 kg (157 lb 10.1 oz) (06/07 0353)  PHYSICAL EXAM: General:  Chronically ill appearing male. Neuro:  Sedated, appears uncomfortable. Cardiovascular:  IRIR. Lungs:  Coarse bilaterally. Abdomen:  BS x 4 , S/NT/ND.  Musculoskeletal:  No edema.   IMPRESSION / PLAN:  Acute hypoxic and hypercapnic respiratory failure - with concern for aspiration vs HCAP. Plan: Continue full vent support, broad spectrum abx, follow cultures. See H&P for further details.  Hypotension with possible developing septic shock. Bradycardia - new this AM. Plan: Continue neosynephrine, goal MAP > 65. Continue to hold cardizem. STAT labs and echo. May need to consult cardiology.  A.fib / A.flutter. Plan: Continue heparin. Assess echo, TSH.  See H&P for further details.  Additional CC time: 20 min.   [name], [facility] Pgr: [phone]  or [phone] 09/12/2016, 7:34 AM

## 2016-09-12 NOTE — Progress Notes (Signed)
ANTICOAGULATION CONSULT NOTE - Follow up [place] for Heparin Indication: atrial fibrillation  Allergies  Allergen Reactions  . Benadryl [Diphenhydramine]     unknown  . Haldol [Haloperidol] Other (See Comments)    unknown  . Shellfish Allergy Other (See Comments)    unknown    Patient Measurements: Weight: 157 lb 10.1 oz (71.5 kg) Heparin dosing weight: 67.6 kg  Vital Signs: Temp: 98.8 F (37.1 C) (06/07 0900) Temp Source: Core (Comment) (06/07 0600) BP: 95/54 (06/07 0900) Pulse Rate: 69 (06/07 0900)  Labs:  Recent Labs  09/10/16 0040 09/11/16 1715 09/11/16 1743 09/11/16 2125 09/12/16 0721  HGB 8.5* 8.7*  --   --   --   HCT 27.0* 28.4*  --   --   --   PLT 260 301  --   --   --   APTT  --   --   --  26  --   LABPROT  --   --   --  13.3  --   INR  --   --   --  1.01  --   HEPARINUNFRC  --   --   --   --  0.86*  CREATININE 1.12 1.47*  --   --   --   TROPONINI <0.03  --  0.03*  --   --     Estimated Creatinine Clearance: 47.3 mL/min (A) (by C-G formula based on SCr of 1.47 mg/dL (H)).   Medical History: Past Medical History:  Diagnosis Date  . Anxiety   . Arthritis   . Asthma   . Bipolar disorder ([place])   . BPH with obstruction/lower urinary tract symptoms   . Cancer ([place])   . Cognitive communication deficit   . Constipation   . COPD (chronic obstructive pulmonary disease) ([place])   . Depression   . Diabetes mellitus without complication ([place])   . Diaphragmatic hernia   . Generalized weakness   . GERD (gastroesophageal reflux disease)   . Hepatitis C   . History of pulmonary embolism   . History of shingles   . HOH (hard of hearing)   . Hyperlipidemia   . Hypertension   . Hypocalcemia   . Insomnia   . Pneumonia   . Polyneuropathy   . Torn rotator cuff    left  . Tremors of nervous system   . Vitamin D deficiency     Medications:  Prescriptions Prior to Admission  Medication Sig Dispense Refill Last Dose  . albuterol  (PROVENTIL) (2.5 MG/3ML) 0.083% nebulizer solution Take 2.5 mg by nebulization 4 (four) times daily.   08/28/2016 at Unknown time  . ARIPiprazole (ABILIFY) 5 MG tablet Take 5 mg by mouth daily.   08/28/2016 at Unknown time  . aspirin 81 MG chewable tablet Chew 1 tablet (81 mg total) by mouth daily. 30 tablet 11   . atorvastatin (LIPITOR) 10 MG tablet Take 10 mg by mouth daily.   08/27/2016 at Unknown time  . baclofen (LIORESAL) 10 MG tablet Take 5 mg by mouth 3 (three) times daily.   08/28/2016 at Unknown time  . budesonide-formoterol (SYMBICORT) 160-4.5 MCG/ACT inhaler Inhale 2 puffs into the lungs 2 (two) times daily.   08/28/2016 at Unknown time  . calcium-vitamin D (OSCAL WITH D) 500-200 MG-UNIT per tablet Take 1 tablet by mouth 2 (two) times daily.    08/28/2016 at Unknown time  . clonazePAM (KLONOPIN) 2 MG tablet Take 1 tablet (2 mg total) by mouth  2 (two) times daily. 4 tablet 0   . diltiazem (CARDIZEM CD) 240 MG 24 hr capsule Take 1 capsule (240 mg total) by mouth daily. 30 capsule 1   . ferrous sulfate 325 (65 FE) MG tablet Take 325 mg by mouth 2 (two) times daily with a meal.   08/28/2016 at Unknown time  . finasteride (PROSCAR) 5 MG tablet Take 5 mg by mouth daily.   08/28/2016 at Unknown time  . gabapentin (NEURONTIN) 100 MG capsule Take 1 capsule by mouth 3 (three) times daily.   08/28/2016 at Unknown time  . INCRUSE ELLIPTA 62.5 MCG/INH AEPB Inhale 1 puff into the lungs daily.   08/28/2016 at Unknown time  . isosorbide mononitrate (IMDUR) 30 MG 24 hr tablet Take 30 mg by mouth daily.   08/28/2016 at Unknown time  . LORazepam (ATIVAN) 1 MG tablet Take 1 tablet (1 mg total) by mouth every 8 (eight) hours as needed for anxiety. 6 tablet 0   . metFORMIN (GLUCOPHAGE) 500 MG tablet Take 500 mg by mouth 2 (two) times daily with a meal.   08/28/2016 at Unknown time  . metoprolol tartrate (LOPRESSOR) 25 MG tablet Take 1 tablet (25 mg total) by mouth 2 (two) times daily. 60 tablet 1   . nitroGLYCERIN  (NITROSTAT) 0.4 MG SL tablet Place 0.4 mg under the tongue every 5 (five) minutes as needed for chest pain.   unknown  . traMADol (ULTRAM) 50 MG tablet Take 1 tablet (50 mg total) by mouth 4 (four) times daily. 6 tablet 0   . traZODone (DESYREL) 50 MG tablet Take 50 mg by mouth at bedtime.   08/27/2016 at Unknown time  . vitamin C (ASCORBIC ACID) 500 MG tablet Take 500 mg by mouth 2 (two) times daily.   08/28/2016 at Unknown time  . predniSONE (DELTASONE) 20 MG tablet Take 3 tablets (60 mg total) by mouth daily with breakfast. And decrease by one tablet daily (Patient not taking: Reported on 09/11/2016) 21 tablet 0 Not Taking at Unknown time    Reviewed, not on an anticoagulant per home med list.  Assessment: 71 yo male admitted with hypotension and AF with RVR, pharmacy consulted to start heparin. Initial heparin level SUPRAtherapeutic (0.86). Hgb low but stable, PLT 301. No issues reported.  Goal of Therapy:  Heparin level 0.3-0.7 units/ml Monitor platelets by anticoagulation protocol: Yes   Plan:  Decrease heparin to 800 units/hr Check anti-Xa level in 8 hours and daily while on heparin Continue to monitor H&H and platelets  Melburn Popper 09/12/2016,9:38 AM

## 2016-09-13 ENCOUNTER — Inpatient Hospital Stay (HOSPITAL_COMMUNITY): Payer: Medicare Other

## 2016-09-13 DIAGNOSIS — E440 Moderate protein-calorie malnutrition: Secondary | ICD-10-CM | POA: Insufficient documentation

## 2016-09-13 DIAGNOSIS — A419 Sepsis, unspecified organism: Principal | ICD-10-CM

## 2016-09-13 DIAGNOSIS — R6520 Severe sepsis without septic shock: Secondary | ICD-10-CM

## 2016-09-13 LAB — URINE CULTURE: Culture: NO GROWTH

## 2016-09-13 LAB — BASIC METABOLIC PANEL
Anion gap: 6 (ref 5–15)
BUN: 31 mg/dL — AB (ref 6–20)
CHLORIDE: 111 mmol/L (ref 101–111)
CO2: 24 mmol/L (ref 22–32)
CREATININE: 1.49 mg/dL — AB (ref 0.61–1.24)
Calcium: 7 mg/dL — ABNORMAL LOW (ref 8.9–10.3)
GFR calc Af Amer: 53 mL/min — ABNORMAL LOW (ref >60)
GFR calc non Af Amer: 46 mL/min — ABNORMAL LOW (ref >60)
GLUCOSE: 101 mg/dL — AB (ref 65–99)
Potassium: 4.4 mmol/L (ref 3.5–5.1)
Sodium: 141 mmol/L (ref 135–145)

## 2016-09-13 LAB — MAGNESIUM
MAGNESIUM: 2 mg/dL (ref 1.7–2.4)
Magnesium: 2 mg/dL (ref 1.7–2.4)
Magnesium: 1.8 mg/dL (ref 1.7–2.4)

## 2016-09-13 LAB — CBC
HCT: 28.8 % — ABNORMAL LOW (ref 39.0–52.0)
Hemoglobin: 8.6 g/dL — ABNORMAL LOW (ref 13.0–17.0)
MCH: 27.7 pg (ref 26.0–34.0)
MCHC: 29.9 g/dL — AB (ref 30.0–36.0)
MCV: 92.6 fL (ref 78.0–100.0)
PLATELETS: 244 10*3/uL (ref 150–400)
RBC: 3.11 MIL/uL — AB (ref 4.22–5.81)
RDW: 17.8 % — ABNORMAL HIGH (ref 11.5–15.5)
WBC: 22.6 10*3/uL — ABNORMAL HIGH (ref 4.0–10.5)

## 2016-09-13 LAB — PHOSPHORUS
PHOSPHORUS: 2.6 mg/dL (ref 2.5–4.6)
Phosphorus: 3 mg/dL (ref 2.5–4.6)
Phosphorus: 2.8 mg/dL (ref 2.5–4.6)

## 2016-09-13 LAB — LACTIC ACID, PLASMA: Lactic Acid, Venous: 1 mmol/L (ref 0.5–1.9)

## 2016-09-13 LAB — GLUCOSE, CAPILLARY
GLUCOSE-CAPILLARY: 87 mg/dL (ref 65–99)
GLUCOSE-CAPILLARY: 136 mg/dL — AB (ref 65–99)
Glucose-Capillary: 88 mg/dL (ref 65–99)
Glucose-Capillary: 106 mg/dL — ABNORMAL HIGH (ref 65–99)
Glucose-Capillary: 123 mg/dL — ABNORMAL HIGH (ref 65–99)

## 2016-09-13 LAB — HEPARIN LEVEL (UNFRACTIONATED): Heparin Unfractionated: 0.53 IU/mL (ref 0.30–0.70)

## 2016-09-13 LAB — PROCALCITONIN: Procalcitonin: 5.57 ng/mL

## 2016-09-13 MED ORDER — MIDAZOLAM HCL 2 MG/2ML IJ SOLN
1.0000 mg | INTRAMUSCULAR | Status: DC | PRN
  Administered 2016-09-13 – 2016-09-15 (×14): 1 mg via INTRAVENOUS
  Filled 2016-09-13 (×14): qty 2

## 2016-09-13 MED ORDER — ACETAMINOPHEN 160 MG/5ML PO SOLN
650.0000 mg | Freq: Four times a day (QID) | ORAL | Status: DC | PRN
  Administered 2016-09-14: 650 mg
  Filled 2016-09-13: qty 20.3

## 2016-09-13 MED ORDER — VITAL HIGH PROTEIN PO LIQD
1000.0000 mL | ORAL | Status: DC
  Administered 2016-09-13: 1000 mL

## 2016-09-13 MED ORDER — VITAL AF 1.2 CAL PO LIQD
1000.0000 mL | ORAL | Status: DC
  Administered 2016-09-13 – 2016-09-15 (×4): 1000 mL

## 2016-09-13 MED ORDER — PRO-STAT SUGAR FREE PO LIQD
30.0000 mL | Freq: Two times a day (BID) | ORAL | Status: DC
  Filled 2016-09-13: qty 30

## 2016-09-13 MED ORDER — ATROPINE SULFATE 1 MG/10ML IJ SOSY
PREFILLED_SYRINGE | INTRAMUSCULAR | Status: AC
  Filled 2016-09-13: qty 10

## 2016-09-13 MED FILL — Medication: Qty: 1 | Status: AC

## 2016-09-13 NOTE — Progress Notes (Signed)
PULMONARY / CRITICAL CARE MEDICINE   Name: JD MCCASTER MRN: [id number] DOB: [birth year]    ADMISSION DATE:  09/11/2016 CONSULTATION DATE:  6/6  REFERRING MD:  Dr. Anitra Lauth [facility] ED[facility]  CHIEF COMPLAINT:  VDRF  BRIEF SUMMARY:   71 year old male with PMH of COPD, DM, Hepatitis C, HTN, Tremors, and Bipolar disorder. He was recently admitted 5/22-5/29 for hypoxemic respiratory failure due to COPD exacerbation treated with IV steroids and nebulized bronchodilators. Course complicated by atrial tachycardia ? MAT treated with beta blockers. He was discharged to SNF. He was seen in ED 6/4 for hypotension, but once he woke up his BP was 103/64 and he was discharged to SNF.   6/6 he was found minimally responsive at SNF with evidence of aspiration of emesis. He required BVM ventilation en route and was intubated in ED for airway protection. Initially he was hypotensive and in AF with RVR, but once airway was established and IVF was started he converted back into NSR.  He was started on antibiotics for HCAP. PCCM asked to admit to [facility] for ICU care.  Admitted 6/6.  Early am 6/8, he had a bradycardic episode into the 20's.     SUBJECTIVE:  Brady episode overnight into the 20's, code called but no CPR required as they were able to find pulses with doppler   VITAL SIGNS: BP (!) 82/58   Pulse 61   Temp 97.3 F (36.3 C)   Resp 18   Wt 157 lb 10.1 oz (71.5 kg)   SpO2 100%   BMI 21.98 kg/m   HEMODYNAMICS:    VENTILATOR SETTINGS: Vent Mode: PRVC FiO2 (%):  [40 %] 40 % Set Rate:  [18 bmp] 18 bmp Vt Set:  [500 mL] 500 mL PEEP:  [5 cmH20] 5 cmH20 Plateau Pressure:  [13 cmH20-24 cmH20] 15 cmH20  INTAKE / OUTPUT: I/O last 3 completed shifts: In: 7272.9 [I.V.:5022.9; IV Piggyback:2250] Out: 670 [Urine:670]  PHYSICAL EXAMINATION: General: elderly male in NAD on vent HEENT: MM pink/moist, ETT, poor dentition  Neuro: sedate, blinks to threat, baseline shaking / tremor  CV: s1s2 irr  irr, no m/r/g PULM: even/non-labored, lungs bilaterally coarse with basilar crackles VX:YIAX, non-tender, bsx4 active  Extremities: warm/dry, no edema Skin: no rashes or lesions, scattered bruising on upper extremities    LABS:  BMET  Recent Labs Lab 09/11/16 1715 09/12/16 0948 09/13/16 0258  NA 141 140 141  K 4.4 4.7 4.4  CL 99* 111 111  CO2 35* 23 24  BUN 25* 28* 31*  CREATININE 1.47* 1.62* 1.49*  GLUCOSE 260* 77 101*    Electrolytes  Recent Labs Lab 09/11/16 1715 09/12/16 0948 09/13/16 0258  CALCIUM 8.7* 7.3* 7.0*  MG  --  1.0* 2.0  PHOS  --  3.8 3.0    CBC  Recent Labs Lab 09/11/16 1715 09/12/16 0948 09/13/16 0258  WBC 29.2* 29.1* 22.6*  HGB 8.7* 8.9* 8.6*  HCT 28.4* 29.3* 28.8*  PLT 301 238 244    Coag's  Recent Labs Lab 09/11/16 2125  APTT 26  INR 1.01    Sepsis Markers  Recent Labs Lab 09/11/16 1732 09/11/16 2035 09/12/16 0246 09/13/16 0258 09/13/16 0300  LATICACIDVEN 2.17* 2.03*  --   --  1.0  PROCALCITON  --   --  3.77 5.57  --     ABG  Recent Labs Lab 09/12/16 0545 09/12/16 0824 09/12/16 1650  PHART 7.269* 7.279* 7.285*  PCO2ART 56.4* 51.8* 49.8*  PO2ART  111* 79.0* 81.0*    Liver Enzymes  Recent Labs Lab 09/10/16 0040 09/11/16 1715  AST 30 35  ALT 41 39  ALKPHOS 54 59  BILITOT 0.7 0.3  ALBUMIN 3.1* 2.9*    Cardiac Enzymes  Recent Labs Lab 09/10/16 0040 09/11/16 1743  TROPONINI <0.03 0.03*    Glucose  Recent Labs Lab 09/12/16 1106 09/12/16 1512 09/12/16 2007 09/12/16 2351 09/13/16 0302 09/13/16 0821  GLUCAP 77 80 113* 96 88 87    Imaging Dg Chest Port 1 View  Result Date: 09/12/2016 CLINICAL DATA:  Central line placement EXAM: PORTABLE CHEST 1 VIEW COMPARISON:  Chest radiograph 09/12/2016 at 4:47 a.m. FINDINGS: Left IJ approach central venous catheter tip is at the midportion of the IVC. Endotracheal tube is in unchanged position. Enteric tube courses beyond the diaphragm with tip and  side-port in the stomach. Bilateral opacities suggestive of pulmonary edema are unchanged. IMPRESSION: 1. Left IJ approach central venous catheter tip in the midportion of the SVC. 2. Otherwise unchanged support apparatus and bilateral opacities suggestive of pulmonary edema. Electronically Signed   By: Ulyses Jarred M.D.   On: 09/12/2016 17:53     STUDIES:  6/6 CT head >> Mild diffuse cortical atrophy. Mild chronic ischemic white matter disease. No acute intracranial abnormality seen.  CULTURES: Blood 6/6 >> Urine 6/6 >> Sputum 6/7 >>   ANTIBIOTICS: Cefepime 6/6 >> Vanco 6/6 >>  SIGNIFICANT EVENTS: 6/06  Admit with aspiration / HCAP, intubated  6/08  Early am episode of brady to 20's, resolved  LINES/TUBES: ETT 6/6 >> L IJ TLC 6/7 >>   DISCUSSION: 71 year old male admitted for aspiration/HCAP. Intubated for airway protection. Hypotensive responded to volume. A flutter on monitor with RVR.   ASSESSMENT / PLAN:  PULMONARY A: Acute hypoxemic respiratory failure secondary to aspiration COPD without acute exacerbation P:   PRVC 8 cc/kg  Wean PEEP / FiO2 for sats >90% Daily PSV trial / WUA  Intermittent CXR  Duoneb + budesonide   CARDIOVASCULAR A:  Hypotension likely secondary to severe sepsis with component of hypovolemia. Lactic very mildly elevated.  PAF/Aflutter   Bradycardia - into the 20's on 6/8 am, resolved.  ? Diltiazem vs reflex bradycardia with neo P:  ICU monitoring of hemodynamics Heparin gtt per pharmacy, appreciate input Neosynephrine gtt for MAP >65 Hold home diltiazem, lopressor, imdur with hypotension Cardizem gtt d/c'd during brady event, restart without bolus.  Amiodarone on national backorder  RENAL A:   AKI P:   Trend BMP / urinary output Replace electrolytes as indicated Avoid nephrotoxic agents, ensure adequate renal perfusion NS @ KVO  GASTROINTESTINAL A:   At Risk Malnutrition  P:   NPO  OGT  PPI for SUP  Begin  TF  HEMATOLOGIC A:   Anemia (basline Hgb 10.5) P:  Trend CBC  Heparin SQ for DVT prophylaxis  Monitor for bleeding  INFECTIOUS A:   Aspiration PNA Severe sepsis P:   ABX as above  Follow cultures above  ENDOCRINE A:   DM P:   CBG with SSI   NEUROLOGIC A:   Acute metabolic encephalopathy Benzo Dependent at Baseline  P:   RASS goal: 0 to -1  Fentanyl gtt for pain / sedation  Hold home Klonopin  PRN versed  D/C propofol with hypotension   FAMILY  - Updates: No family at bedside am 6/8  - Inter-disciplinary family meet or Palliative Care meeting due by:  6/12   CC Time: 30 minutes  Brandi  Alfredo Martinez NP-C [facility] Pgr: [phone] or if no answer [phone] 09/13/2016, 9:10 AM  Attending Note:  I have examined patient, reviewed labs, studies and notes. I have discussed the case with B Ollis, and I agree with the data and plans as amended above. 71 year old man with a history of dementia, COPD, diabetes, bipolar disorder. Also with a history of MAT. He was admitted after of overt aspiration event with respiratory failure, pneumonia/pneumonitis. He was intubated for airway protection. Course complicated by atrial fibrillation with a rapid ventricular response. He was treated with diltiazem and this had to be stopped due to profound bradycardia. He had another bradycardic event on the evening of 6/7 to the 20s with suspected pulselessness. He resolved spontaneously and did not get CPR. He remains on pressors. This morning he is poorly responsive, has some tremor, has some tremor. He remains on phenylephrine. Distant BS without wheeze. He is tachycardic, irregular. We will try starting low dose dilt 5, continue efforts to lighten sedation and push for SBT's.  Independent critical care time is 33 minutes.   Baltazar Apo, MD, PhD 09/13/2016, 2:40 PM [facility] [phone] or if no answer [phone]

## 2016-09-13 NOTE — Progress Notes (Signed)
Code Blue called for [phone] after patient experienced bradycardia to the 20s and a pulse was not palpated. Subsequently, a pulse was found using doppler and Code Blue was cancelled without initiating ACLS. Patient is on Neo with BPs in the 90-100s/60s. Using the ACLS pads, a better telemetry strip was obtained with less artifact which revealed afib with HR in the 80s which variably will drop to the 20-30s as noted previously in the day. This information was relayed to Louretta Parma, NP with PCCM.  Martyn Malay, DO PGY-3 Internal Medicine Resident Pager # [phone] 09/13/2016 2:49 AM

## 2016-09-13 NOTE — Progress Notes (Signed)
ANTICOAGULATION CONSULT NOTE - Follow up [place] for Heparin Indication: atrial fibrillation  Allergies  Allergen Reactions  . Benadryl [Diphenhydramine]     unknown  . Haldol [Haloperidol] Other (See Comments)    unknown  . Shellfish Allergy Other (See Comments)    unknown    Patient Measurements: Weight: 157 lb 10.1 oz (71.5 kg) Heparin dosing weight: 67.6 kg  Vital Signs: Temp: 98.8 F (37.1 C) (06/08 1100) BP: 125/89 (06/08 1141) Pulse Rate: 96 (06/08 1141)  Labs:  Recent Labs  09/11/16 1715 09/11/16 1743 09/11/16 2125 09/12/16 0721 09/12/16 0948 09/12/16 1920 09/13/16 0258 09/13/16 0301  HGB 8.7*  --   --   --  8.9*  --  8.6*  --   HCT 28.4*  --   --   --  29.3*  --  28.8*  --   PLT 301  --   --   --  238  --  244  --   APTT  --   --  26  --   --   --   --   --   LABPROT  --   --  13.3  --   --   --   --   --   INR  --   --  1.01  --   --   --   --   --   HEPARINUNFRC  --   --   --  0.86*  --  0.43  --  0.53  CREATININE 1.47*  --   --   --  1.62*  --  1.49*  --   TROPONINI  --  0.03*  --   --   --   --   --   --     Estimated Creatinine Clearance: 46.7 mL/min (A) (by C-G formula based on SCr of 1.49 mg/dL (H)).  Reviewed, not on an anticoagulant per home med list.  Assessment: 71 yo male admitted with hypotension and AF with RVR, pharmacy consulted to start heparin. Heparin level remains therapeutic on 800 units/hr. Hgb stable, PLT stable.  Goal of Therapy:  Heparin level 0.3-0.7 units/ml Monitor platelets by anticoagulation protocol: Yes   Plan:  Continue heparin at 800 units/hr Daily heparin level Continue to monitor H&H and platelets   Melburn Popper, PharmD Clinical Pharmacy Resident Pager: [phone] 09/13/16 12:06 PM

## 2016-09-13 NOTE — Progress Notes (Signed)
eLink Physician-Brief Progress Note Patient Name: PRAYAN ULIN DOB: [birth year] MRN: [id number]   Date of Service  09/13/2016  HPI/Events of Note  Agitation  eICU Interventions  Will increase Versed to 1 mg IV Q 1 hour PRN.     Intervention Category Minor Interventions: Agitation / anxiety - evaluation and management  Lloyde Ludlam Eugene 09/13/2016, 8:41 PM

## 2016-09-13 NOTE — Progress Notes (Addendum)
Nutrition Consult/Follow Up  DOCUMENTATION CODES:   Non-severe (moderate) malnutrition in context of chronic illness  INTERVENTION:    Initiate Vital AF 1.2 at goal rate of 70 ml/h (1680 ml per day)   TF regimen to provide 2016 kcals, 126 gm protein, 1362 ml of free water  NUTRITION DIAGNOSIS:   Malnutrition (Moderate ) related to chronic illness as evidenced by mild depletion of body fat, mild depletion of muscle mass, ongoing   GOAL:   Patient will meet greater than or equal to 90% of their needs, progressing  MONITOR:   Vent status, Labs, Weight trends, TF tolerance  ASSESSMENT:   70 yo male admitted with acute respiratory failure secondary to aspiration, HCAP; AKI, hypotension likely due to sepsis. Pt with hx of COPD, DM, diaphragmatic hernia, GERD, hepatitis C, HTN, HL, anxiety/depression/bipolar disorder, cognitive communication deficit   Patient is currently intubated on ventilator support MV: 10.7 L/min Temp (24hrs), Avg:97.8 F (36.6 C), Min:97 F (36.1 C), Max:100.6 F (38.1 C)  OGT in place  Found minimally responsive at SNF with evidence of aspiration of emesis.  Now with VDRF, PNA. Experiencing some hypotension/bradycardia.  Likely due to severe sepsis and hypovolemia.  RD consulted for TF initiation and management via Adult Tube Feeding Protocol. Medications reviewed.  Labs reviewed.  Mg, Phos, K+ WNL. CBG's [id number].  Diet Order:  Diet NPO time specified  Skin:  Reviewed, no issues  Last BM:  PTA  Height:   Ht Readings from Last 1 Encounters:  09/09/16 5\' 11"  (1.803 m)   Weight:   Wt Readings from Last 1 Encounters:  09/13/16 157 lb 10.1 oz (71.5 kg)   BMI:  Body mass index is 21.98 kg/m.  Estimated Nutritional Needs:   Kcal:  1922  Protein:  110-125 gm  Fluid:  per MD  EDUCATION NEEDS:   No education needs identified at this time  Arthur Holms, RD, LDN Pager #: [phone] After-Hours Pager #: [phone]

## 2016-09-14 LAB — BASIC METABOLIC PANEL
Anion gap: 8 (ref 5–15)
BUN: 34 mg/dL — AB (ref 6–20)
CHLORIDE: 111 mmol/L (ref 101–111)
CO2: 23 mmol/L (ref 22–32)
CREATININE: 1.33 mg/dL — AB (ref 0.61–1.24)
Calcium: 7.4 mg/dL — ABNORMAL LOW (ref 8.9–10.3)
GFR calc Af Amer: >60 mL/min (ref >60)
GFR calc non Af Amer: 53 mL/min — ABNORMAL LOW (ref >60)
GLUCOSE: 99 mg/dL (ref 65–99)
POTASSIUM: 4.2 mmol/L (ref 3.5–5.1)
SODIUM: 142 mmol/L (ref 135–145)

## 2016-09-14 LAB — PROCALCITONIN: PROCALCITONIN: 3.65 ng/mL

## 2016-09-14 LAB — GLUCOSE, CAPILLARY
GLUCOSE-CAPILLARY: 90 mg/dL (ref 65–99)
Glucose-Capillary: 126 mg/dL — ABNORMAL HIGH (ref 65–99)
Glucose-Capillary: 135 mg/dL — ABNORMAL HIGH (ref 65–99)
Glucose-Capillary: 138 mg/dL — ABNORMAL HIGH (ref 65–99)
Glucose-Capillary: 147 mg/dL — ABNORMAL HIGH (ref 65–99)
Glucose-Capillary: 169 mg/dL — ABNORMAL HIGH (ref 65–99)

## 2016-09-14 LAB — CBC
HCT: 26.6 % — ABNORMAL LOW (ref 39.0–52.0)
HEMOGLOBIN: 7.9 g/dL — AB (ref 13.0–17.0)
MCH: 27.4 pg (ref 26.0–34.0)
MCHC: 29.7 g/dL — AB (ref 30.0–36.0)
MCV: 92.4 fL (ref 78.0–100.0)
PLATELETS: 213 10*3/uL (ref 150–400)
RBC: 2.88 MIL/uL — AB (ref 4.22–5.81)
RDW: 17.8 % — ABNORMAL HIGH (ref 11.5–15.5)
WBC: 15.1 10*3/uL — ABNORMAL HIGH (ref 4.0–10.5)

## 2016-09-14 LAB — CULTURE, RESPIRATORY

## 2016-09-14 LAB — CULTURE, RESPIRATORY W GRAM STAIN: Culture: NORMAL

## 2016-09-14 LAB — HEPARIN LEVEL (UNFRACTIONATED): HEPARIN UNFRACTIONATED: 0.3 [IU]/mL (ref 0.30–0.70)

## 2016-09-14 LAB — MAGNESIUM: Magnesium: 2 mg/dL (ref 1.7–2.4)

## 2016-09-14 LAB — PHOSPHORUS: Phosphorus: 2.5 mg/dL (ref 2.5–4.6)

## 2016-09-14 MED ORDER — FUROSEMIDE 10 MG/ML IJ SOLN
40.0000 mg | Freq: Once | INTRAMUSCULAR | Status: AC
  Administered 2016-09-14: 40 mg via INTRAVENOUS
  Filled 2016-09-14: qty 4

## 2016-09-14 MED ORDER — CEFEPIME HCL 2 G IJ SOLR
2.0000 g | Freq: Two times a day (BID) | INTRAMUSCULAR | Status: DC
  Administered 2016-09-14 – 2016-09-15 (×2): 2 g via INTRAVENOUS
  Filled 2016-09-14 (×2): qty 2

## 2016-09-14 NOTE — Progress Notes (Signed)
ANTICOAGULATION CONSULT NOTE - Follow up [place] for Heparin Indication: atrial fibrillation  Allergies  Allergen Reactions  . Benadryl [Diphenhydramine]     unknown  . Haldol [Haloperidol] Other (See Comments)    unknown  . Shellfish Allergy Other (See Comments)    unknown    Patient Measurements: Weight: 157 lb 10.1 oz (71.5 kg) Heparin dosing weight: 67.6 kg  Vital Signs: Temp: 100.9 F (38.3 C) (06/09 1000) BP: 130/68 (06/09 1000) Pulse Rate: 117 (06/09 1108)  Labs:  Recent Labs  09/11/16 1743 09/11/16 2125  09/12/16 0948 09/12/16 1920 09/13/16 0258 09/13/16 0301 09/14/16 0339  HGB  --   --   --  8.9*  --  8.6*  --  7.9*  HCT  --   --   --  29.3*  --  28.8*  --  26.6*  PLT  --   --   --  238  --  244  --  213  APTT  --  26  --   --   --   --   --   --   LABPROT  --  13.3  --   --   --   --   --   --   INR  --  1.01  --   --   --   --   --   --   HEPARINUNFRC  --   --   < >  --  0.43  --  0.53 0.30  CREATININE  --   --   --  1.62*  --  1.49*  --  1.33*  TROPONINI 0.03*  --   --   --   --   --   --   --   < > = values in this interval not displayed.  Estimated Creatinine Clearance: 52.3 mL/min (A) (by C-G formula based on SCr of 1.33 mg/dL (H)).  Reviewed, not on an anticoagulant per home med list.  Assessment: 71 yo male admitted with hypotension and AF with RVR. Pharmacy consulted to dose heparin. Heparin level remains therapeutic at 0.3 on 800 units/hr but trending towards bottom of range. Hgb low stable, PLT stable. No bleeding or IV line issues per RN.  Goal of Therapy:  Heparin level 0.3-0.7 units/ml Monitor platelets by anticoagulation protocol: Yes   Plan:  Increase heparin slightly to 900 units/hr to ensure stays in range Daily heparin level/CBC Monitor for s/sx bleeding   Elicia Lamp, PharmD, BCPS Clinical Pharmacist Rx Phone # for today: [phone] After 3:30PM, please call Main Rx: #[id number] 09/14/2016 11:56 AM

## 2016-09-14 NOTE — Progress Notes (Signed)
Pharmacy Antibiotic Note  Garrett Spencer is a 71 y.o. male admitted on 09/11/2016 with pneumonia.  Pharmacy has been consulted for cefepime dosing - day #4. Vancomycin d/c'd per discussion with CCM. Tmax/24hr 100.9, WBC down 15.1, PCT trend down. SCr trend down to 1.33, CrCl~52, good UOP. Cultures negative to date.   Plan: Increase cefepime to 2g IV q12h for improving renal function Monitor clinical progress, c/s, renal function F/u de-escalation plan/LOT   Weight: 157 lb 10.1 oz (71.5 kg)  Temp (24hrs), Avg:99.9 F (37.7 C), Min:98.8 F (37.1 C), Max:100.9 F (38.3 C)   Recent Labs Lab 09/10/16 0040 09/11/16 1715 09/11/16 1732 09/11/16 2035 09/12/16 0948 09/13/16 0258 09/13/16 0300 09/14/16 0339  WBC 20.5* 29.2*  --   --  29.1* 22.6*  --  15.1*  CREATININE 1.12 1.47*  --   --  1.62* 1.49*  --  1.33*  LATICACIDVEN 0.9  --  2.17* 2.03*  --   --  1.0  --     Estimated Creatinine Clearance: 52.3 mL/min (A) (by C-G formula based on SCr of 1.33 mg/dL (H)).    Allergies  Allergen Reactions  . Benadryl [Diphenhydramine]     unknown  . Haldol [Haloperidol] Other (See Comments)    unknown  . Shellfish Allergy Other (See Comments)    unknown    Antimicrobials this admission: Vanc 6/6 >> 6/9 Cefepime 6/6 >>   Dose adjustments this admission: n/a   Microbiology results: 5/23 Bcx: neg 6/6: UCx: neg 6/6: BCx: ngtd 6/6: MRSA: neg 6/7: RCx: normal flora   Elicia Lamp, PharmD, BCPS Clinical Pharmacist Rx Phone # for today: [phone] After 3:30PM, please call Main Rx: #[id number] 09/14/2016 11:52 AM

## 2016-09-14 NOTE — Progress Notes (Signed)
PULMONARY / CRITICAL CARE MEDICINE   Name: Garrett Spencer MRN: [id number] DOB: 1945-09-18    ADMISSION DATE:  09/11/2016 CONSULTATION DATE:  6/6  REFERRING MD:  Dr. Anitra Lauth [place] EDP  CHIEF COMPLAINT:  VDRF  BRIEF SUMMARY:   71 year old male with PMH of COPD, DM, Hepatitis C, HTN, Tremors, and Bipolar disorder. He was recently admitted 5/22-5/29 for hypoxemic respiratory failure due to COPD exacerbation treated with IV steroids and nebulized bronchodilators. Course complicated by atrial tachycardia ? MAT treated with beta blockers. He was discharged to SNF. He was seen in ED 6/4 for hypotension, but once he woke up his BP was 103/64 and he was discharged to SNF.   6/6 he was found minimally responsive at SNF with evidence of aspiration of emesis. He required BVM ventilation en route and was intubated in ED for airway protection. Initially he was hypotensive and in AF with RVR, but once airway was established and IVF was started he converted back into NSR.  He was started on antibiotics for HCAP. PCCM asked to admit to [facility] for ICU care.  Admitted 6/6.  Early am 6/8, he had a bradycardic episode into the 20's.     SUBJECTIVE:   More awake and interacting Remains on dilt 5 Neo weaning, sedation weaning Did some high PSV today, then back to [facility] for low rate   VITAL SIGNS: BP 118/61 (BP Location: Left Arm)   Pulse 98   Temp (!) 100.6 F (38.1 C)   Resp (!) 0   Wt 71.5 kg (157 lb 10.1 oz)   SpO2 98%   BMI 21.98 kg/m   HEMODYNAMICS:    VENTILATOR SETTINGS: Vent Mode: CPAP;PSV FiO2 (%):  [40 %] 40 % Set Rate:  [18 bmp] 18 bmp Vt Set:  [500 mL] 500 mL PEEP:  [5 cmH20] 5 cmH20 Pressure Support:  [18 cmH20] 18 cmH20 Plateau Pressure:  [18 cmH20-19 cmH20] 18 cmH20  INTAKE / OUTPUT: I/O last 3 completed shifts: In: 3039.3 [I.V.:1921.3; NG/GT:1068; IV Piggyback:50] Out: 1941 [Urine:1290]  PHYSICAL EXAMINATION: General: elderly man on MV HEENT: OP clear, ETT in  place Neuro: awake and eyes open, nodded to questions.  CV: irregular, tachy, no M PULM: coarse BS bilaterally GI: benign, + BS Extremities: no edema Skin: no rash   LABS:  BMET  Recent Labs Lab 09/12/16 0948 09/13/16 0258 09/14/16 0339  NA 140 141 142  K 4.7 4.4 4.2  CL 111 111 111  CO2 23 24 23   BUN 28* 31* 34*  CREATININE 1.62* 1.49* 1.33*  GLUCOSE 77 101* 99    Electrolytes  Recent Labs Lab 09/12/16 0948 09/13/16 0258 09/13/16 1412 09/13/16 1841 09/14/16 0339  CALCIUM 7.3* 7.0*  --   --  7.4*  MG 1.0* 2.0 2.0 1.8 2.0  PHOS 3.8 3.0 2.8 2.6 2.5    CBC  Recent Labs Lab 09/12/16 0948 09/13/16 0258 09/14/16 0339  WBC 29.1* 22.6* 15.1*  HGB 8.9* 8.6* 7.9*  HCT 29.3* 28.8* 26.6*  PLT 238 244 213    Coag's  Recent Labs Lab 09/11/16 2125  APTT 26  INR 1.01    Sepsis Markers  Recent Labs Lab 09/11/16 1732 09/11/16 2035 09/12/16 0246 09/13/16 0258 09/13/16 0300 09/14/16 0339  LATICACIDVEN 2.17* 2.03*  --   --  1.0  --   PROCALCITON  --   --  3.77 5.57  --  3.65    ABG  Recent Labs Lab 09/12/16 0545 09/12/16 7408  09/12/16 1650  PHART 7.269* 7.279* 7.285*  PCO2ART 56.4* 51.8* 49.8*  PO2ART 111* 79.0* 81.0*    Liver Enzymes  Recent Labs Lab 09/10/16 0040 09/11/16 1715  AST 30 35  ALT 41 39  ALKPHOS 54 59  BILITOT 0.7 0.3  ALBUMIN 3.1* 2.9*    Cardiac Enzymes  Recent Labs Lab 09/10/16 0040 09/11/16 1743  TROPONINI <0.03 0.03*    Glucose  Recent Labs Lab 09/13/16 1621 09/13/16 2109 09/14/16 0002 09/14/16 0410 09/14/16 0854 09/14/16 1116  GLUCAP 136* 123* 135* 90 169* 147*    Imaging Dg Chest Port 1 View  Result Date: 09/13/2016 CLINICAL DATA:  Acute respiratory failure with hypoxia. EXAM: PORTABLE CHEST 1 VIEW COMPARISON:  Radiographs of September 12, 2016. FINDINGS: Stable cardiomediastinal silhouette. Endotracheal and nasogastric tubes are unchanged in position. Left internal jugular catheter is unchanged  with tip in SVC. No pneumothorax or pleural effusion is noted. Mild bibasilar pulmonary edema is noted which is improved compared to prior exam. Bony thorax is unremarkable. IMPRESSION: Stable support apparatus. Improved bilateral pulmonary edema compared to prior exam. Electronically Signed   By: Marijo Conception, M.D.   On: 09/13/2016 14:04     STUDIES:  6/6 CT head >> Mild diffuse cortical atrophy. Mild chronic ischemic white matter disease. No acute intracranial abnormality seen.  CULTURES: Blood 6/6 >> Urine 6/6 >> Sputum 6/7 >>   ANTIBIOTICS: Cefepime 6/6 >> Vanco 6/6 >>  SIGNIFICANT EVENTS: 6/06  Admit with aspiration / HCAP, intubated  6/08  Early am episode of brady to 20's, resolved  LINES/TUBES: ETT 6/6 >> L IJ TLC 6/7 >>   DISCUSSION: 71 year old male admitted for aspiration/HCAP. Intubated for airway protection. Hypotensive responded to volume. A flutter on monitor with RVR.   ASSESSMENT / PLAN:  PULMONARY A: Acute hypoxemic respiratory failure secondary to aspiration COPD without acute exacerbation P:   Continue PRVC 8cc/hg OK to do high PSV as he can tolerate Intermittent CXR duoneb and budesonide scheduled.   CARDIOVASCULAR A:  Hypotension likely secondary to severe sepsis with component of hypovolemia. Lactic very mildly elevated.  PAF/Aflutter   Bradycardia - into the 20's on 6/8 am, resolved.  ? Diltiazem vs reflex bradycardia with neo P:  heparion gtt per pharmacy Wean Neo as able dilt gtt 5 Home rate control on hold No amiodarone available at this time.   RENAL A:   AKI P:   Follow BMP, UOP Replace electrolytes as indicated Avoid nephrotoxins  GASTROINTESTINAL A:   At Risk Malnutrition  P:   NPO OGT PPI for SUP TF's   HEMATOLOGIC A:   Anemia (basline Hgb 10.5) P:  Trend CBC Heparin sq  INFECTIOUS A:   Aspiration PNA Severe sepsis P:   ABX as above  Follow cultures above  ENDOCRINE A:   DM P:   CBG on SSI    NEUROLOGIC A:   Acute metabolic encephalopathy Benzo Dependent at Baseline  P:   RASS goal: 0 to -1  Fentanyl gtt for pain / sedation  Hold home Klonopin  PRN versed    FAMILY  - Updates: No family at bedside am 6/8  - Inter-disciplinary family meet or Palliative Care meeting due by:  6/12  Independent critical care time is 32 minutes.   Baltazar Apo, MD, PhD 09/14/2016, 1:06 PM [facility] [phone] or if no answer [phone]

## 2016-09-14 NOTE — Progress Notes (Signed)
[place] Progress Note Patient Name: Garrett Spencer DOB: 07-16-1945 MRN: [id number]   Date of Service  09/14/2016  HPI/Events of Note  Increased work of breathing with markedly positive fluid balance  eICU Interventions  Dose of lasix     Intervention Category Intermediate Interventions: Respiratory distress - evaluation and management  Mauri Brooklyn, P 09/14/2016, 9:42 PM

## 2016-09-15 ENCOUNTER — Inpatient Hospital Stay (HOSPITAL_COMMUNITY): Payer: Medicare Other

## 2016-09-15 LAB — BASIC METABOLIC PANEL
ANION GAP: 7 (ref 5–15)
BUN: 37 mg/dL — AB (ref 6–20)
CALCIUM: 7.7 mg/dL — AB (ref 8.9–10.3)
CO2: 27 mmol/L (ref 22–32)
Chloride: 108 mmol/L (ref 101–111)
Creatinine, Ser: 1.17 mg/dL (ref 0.61–1.24)
GFR calc Af Amer: >60 mL/min (ref >60)
GLUCOSE: 149 mg/dL — AB (ref 65–99)
Potassium: 3.6 mmol/L (ref 3.5–5.1)
SODIUM: 142 mmol/L (ref 135–145)

## 2016-09-15 LAB — GLUCOSE, CAPILLARY
GLUCOSE-CAPILLARY: 154 mg/dL — AB (ref 65–99)
GLUCOSE-CAPILLARY: 157 mg/dL — AB (ref 65–99)
GLUCOSE-CAPILLARY: 159 mg/dL — AB (ref 65–99)
Glucose-Capillary: 146 mg/dL — ABNORMAL HIGH (ref 65–99)
Glucose-Capillary: 155 mg/dL — ABNORMAL HIGH (ref 65–99)
Glucose-Capillary: 169 mg/dL — ABNORMAL HIGH (ref 65–99)
Glucose-Capillary: 169 mg/dL — ABNORMAL HIGH (ref 65–99)

## 2016-09-15 LAB — CBC
HCT: 24.9 % — ABNORMAL LOW (ref 39.0–52.0)
Hemoglobin: 7.6 g/dL — ABNORMAL LOW (ref 13.0–17.0)
MCH: 27.9 pg (ref 26.0–34.0)
MCHC: 30.5 g/dL (ref 30.0–36.0)
MCV: 91.5 fL (ref 78.0–100.0)
PLATELETS: 190 10*3/uL (ref 150–400)
RBC: 2.72 MIL/uL — AB (ref 4.22–5.81)
RDW: 17.9 % — ABNORMAL HIGH (ref 11.5–15.5)
WBC: 14.4 10*3/uL — ABNORMAL HIGH (ref 4.0–10.5)

## 2016-09-15 LAB — HEPARIN LEVEL (UNFRACTIONATED)
HEPARIN UNFRACTIONATED: 0.21 [IU]/mL — AB (ref 0.30–0.70)
Heparin Unfractionated: 0.23 IU/mL — ABNORMAL LOW (ref 0.30–0.70)

## 2016-09-15 MED ORDER — FUROSEMIDE 10 MG/ML IJ SOLN
40.0000 mg | Freq: Once | INTRAMUSCULAR | Status: AC
  Administered 2016-09-15: 40 mg via INTRAVENOUS
  Filled 2016-09-15: qty 4

## 2016-09-15 MED ORDER — BISACODYL 10 MG RE SUPP
10.0000 mg | Freq: Every day | RECTAL | Status: DC | PRN
Start: 2016-09-15 — End: 2016-09-17
  Administered 2016-09-15: 10 mg via RECTAL
  Filled 2016-09-15: qty 1

## 2016-09-15 MED ORDER — MIDAZOLAM HCL 2 MG/2ML IJ SOLN
2.0000 mg | Freq: Once | INTRAMUSCULAR | Status: AC
  Administered 2016-09-15: 2 mg via INTRAVENOUS
  Filled 2016-09-15: qty 2

## 2016-09-15 MED ORDER — CEFEPIME HCL 2 G IJ SOLR
2.0000 g | Freq: Three times a day (TID) | INTRAMUSCULAR | Status: DC
  Administered 2016-09-15 – 2016-09-16 (×3): 2 g via INTRAVENOUS
  Filled 2016-09-15 (×6): qty 2

## 2016-09-15 MED ORDER — MIDAZOLAM HCL 2 MG/2ML IJ SOLN
2.0000 mg | INTRAMUSCULAR | Status: DC | PRN
  Administered 2016-09-15 – 2016-09-16 (×3): 2 mg via INTRAVENOUS
  Filled 2016-09-15 (×3): qty 2

## 2016-09-15 MED ORDER — IPRATROPIUM-ALBUTEROL 0.5-2.5 (3) MG/3ML IN SOLN
3.0000 mL | Freq: Three times a day (TID) | RESPIRATORY_TRACT | Status: DC
  Administered 2016-09-15 – 2016-09-18 (×7): 3 mL via RESPIRATORY_TRACT
  Filled 2016-09-15 (×10): qty 3

## 2016-09-15 NOTE — Progress Notes (Signed)
Pharmacy Antibiotic Note  Garrett Spencer is a 71 y.o. male admitted on 09/11/2016 with pneumonia.  Pharmacy has been consulted for cefepime dosing - day #5. Tmax/24hr 101.1, WBC down 14.4, PCT trend down. SCr trend down to 1.17, CrCl~60, good UOP. Cultures negative to date.   Plan: Increase cefepime to 2g IV q8h for improving renal function Monitor clinical progress, c/s, renal function F/u de-escalation plan/LOT   Weight: 161 lb 2.5 oz (73.1 kg)  Temp (24hrs), Avg:99.5 F (37.5 C), Min:98.8 F (37.1 C), Max:100.6 F (38.1 C)   Recent Labs Lab 09/10/16 0040 09/11/16 1715 09/11/16 1732 09/11/16 2035 09/12/16 0948 09/13/16 0258 09/13/16 0300 09/14/16 0339 09/15/16 0432  WBC 20.5* 29.2*  --   --  29.1* 22.6*  --  15.1* 14.4*  CREATININE 1.12 1.47*  --   --  1.62* 1.49*  --  1.33* 1.17  LATICACIDVEN 0.9  --  2.17* 2.03*  --   --  1.0  --   --     Estimated Creatinine Clearance: 60.7 mL/min (by C-G formula based on SCr of 1.17 mg/dL).    Allergies  Allergen Reactions  . Benadryl [Diphenhydramine]     unknown  . Haldol [Haloperidol] Other (See Comments)    unknown  . Shellfish Allergy Other (See Comments)    unknown    Antimicrobials this admission: Vanc 6/6 >> 6/9 Cefepime 6/6 >>   Dose adjustments this admission: 6/9: cefepime to 2g IV q12h 6/10: cefepime to 2g IV q8h  Microbiology results: 5/23 Bcx: neg 6/6: UCx: neg 6/6: BCx: ngtd 6/6: MRSA: neg 6/7: RCx: normal flora   Elicia Lamp, PharmD, BCPS Clinical Pharmacist Rx Phone # for today: [phone] After 3:30PM, please call Main Rx: #[id number] 09/15/2016 11:20 AM

## 2016-09-15 NOTE — Progress Notes (Signed)
PULMONARY / CRITICAL CARE MEDICINE   Name: Garrett Spencer MRN: [id number] DOB: 1946-03-21    ADMISSION DATE:  09/11/2016 CONSULTATION DATE:  6/6  REFERRING MD:  Dr. Anitra Lauth [place] EDP  CHIEF COMPLAINT:  VDRF  BRIEF SUMMARY:   71 year old male with PMH of COPD, DM, Hepatitis C, HTN, Tremors, and Bipolar disorder. He was recently admitted 5/22-5/29 for hypoxemic respiratory failure due to COPD exacerbation treated with IV steroids and nebulized bronchodilators. Course complicated by atrial tachycardia ? MAT treated with beta blockers. He was discharged to SNF. He was seen in ED 6/4 for hypotension, but once he woke up his BP was 103/64 and he was discharged to SNF.   6/6 he was found minimally responsive at SNF with evidence of aspiration of emesis. He required BVM ventilation en route and was intubated in ED for airway protection. Initially he was hypotensive and in AF with RVR, but once airway was established and IVF was started he converted back into NSR.  He was started on antibiotics for HCAP. PCCM asked to admit to [facility] for ICU care.  Admitted 6/6.  Early am 6/8, he had a bradycardic episode into the 20's.     SUBJECTIVE:   Diltiazem up to 15, no recurrence of bradycardia More awake and interactive today Currently on pressure support and tolerating Received extra bronchodilator therapy overnight for wheezing and evidence of bronchospasm   VITAL SIGNS: BP 100/65   Pulse 94   Temp 99.7 F (37.6 C)   Resp (!) 25   Wt 73.1 kg (161 lb 2.5 oz)   SpO2 98%   BMI 22.48 kg/m   HEMODYNAMICS:    VENTILATOR SETTINGS: Vent Mode: PRVC FiO2 (%):  [40 %] 40 % Set Rate:  [18 bmp] 18 bmp Vt Set:  [500 mL-550 mL] 550 mL PEEP:  [5 cmH20] 5 cmH20 Pressure Support:  [5 cmH20-18 cmH20] 12 cmH20 Plateau Pressure:  [16 cmH20-19 cmH20] 19 cmH20  INTAKE / OUTPUT: I/O last 3 completed shifts: In: 3834.7 [I.V.:1254.7; NG/GT:2480; IV Piggyback:100] Out: 8786 [Urine:3980]  PHYSICAL  EXAMINATION: General: Elderly man on mechanical ventilation, currently on pressure support HEENT: ET tube in place, oropharynx clear, pupils equal Neuro: Awake, eyes open, follows commands, nods to questions CV: Irregular, heart rate 90s PULM: Comfortable respiratory pattern on pressure support, bilateral expiratory wheezes present GI: Soft, benign, positive bowel sounds Extremities: No edema Skin: No rash   LABS:  BMET  Recent Labs Lab 09/13/16 0258 09/14/16 0339 09/15/16 0432  NA 141 142 142  K 4.4 4.2 3.6  CL 111 111 108  CO2 24 23 27   BUN 31* 34* 37*  CREATININE 1.49* 1.33* 1.17  GLUCOSE 101* 99 149*    Electrolytes  Recent Labs Lab 09/13/16 0258 09/13/16 1412 09/13/16 1841 09/14/16 0339 09/15/16 0432  CALCIUM 7.0*  --   --  7.4* 7.7*  MG 2.0 2.0 1.8 2.0  --   PHOS 3.0 2.8 2.6 2.5  --     CBC  Recent Labs Lab 09/13/16 0258 09/14/16 0339 09/15/16 0432  WBC 22.6* 15.1* 14.4*  HGB 8.6* 7.9* 7.6*  HCT 28.8* 26.6* 24.9*  PLT 244 213 190    Coag's  Recent Labs Lab 09/11/16 2125  APTT 26  INR 1.01    Sepsis Markers  Recent Labs Lab 09/11/16 1732 09/11/16 2035 09/12/16 0246 09/13/16 0258 09/13/16 0300 09/14/16 0339  LATICACIDVEN 2.17* 2.03*  --   --  1.0  --   PROCALCITON  --   --  3.77 5.57  --  3.65    ABG  Recent Labs Lab 09/12/16 0545 09/12/16 0824 09/12/16 1650  PHART 7.269* 7.279* 7.285*  PCO2ART 56.4* 51.8* 49.8*  PO2ART 111* 79.0* 81.0*    Liver Enzymes  Recent Labs Lab 09/10/16 0040 09/11/16 1715  AST 30 35  ALT 41 39  ALKPHOS 54 59  BILITOT 0.7 0.3  ALBUMIN 3.1* 2.9*    Cardiac Enzymes  Recent Labs Lab 09/10/16 0040 09/11/16 1743  TROPONINI <0.03 0.03*    Glucose  Recent Labs Lab 09/14/16 1116 09/14/16 1649 09/14/16 1929 09/15/16 0010 09/15/16 0338 09/15/16 0906  GLUCAP 147* 138* 126* 159* 146* 169*    Imaging Dg Chest Port 1 View  Result Date: 09/15/2016 CLINICAL DATA:  Hypoxia  EXAM: PORTABLE CHEST 1 VIEW COMPARISON:  September 13, 2016 FINDINGS: Endotracheal tube tip is 4.6 cm above the carina. Central catheter tip is in the superior vena cava. Nasogastric tube tip and side port below the diaphragm. No pneumothorax. There has been an increase in interstitial and patchy alveolar edema in the lung bases compared to recent study. There are small pleural effusions bilaterally. Heart size and contour remain within normal limits. There is mild pulmonary venous hypertension. No adenopathy evident. There is aortic atherosclerosis. No evident bone lesions. IMPRESSION: Tube and catheter positions as described without pneumothorax. Increase in interstitial and alveolar edema in the lower lung zones. Likely progression of congestive heart failure. Cardiac silhouette stable. Electronically Signed   By: Lowella Grip III M.D.   On: 09/15/2016 07:13     STUDIES:  6/6 CT head >> Mild diffuse cortical atrophy. Mild chronic ischemic white matter disease. No acute intracranial abnormality seen.  CULTURES: Blood 6/6 >> Urine 6/6 >> negative Sputum 6/7 >> normal flora  ANTIBIOTICS: Cefepime 6/6 >> Vanco 6/6 >> 6/9  SIGNIFICANT EVENTS: 6/06  Admit with aspiration / HCAP, intubated  6/08  Early am episode of brady to 20's, resolved  LINES/TUBES: ETT 6/6 >> L IJ TLC 6/7 >>   DISCUSSION: 71 year old male admitted for aspiration/HCAP. Intubated for airway protection. Hypotensive responded to volume. A flutter on monitor with RVR.   ASSESSMENT / PLAN:  PULMONARY A: Acute hypoxemic respiratory failure secondary to aspiration COPD without acute exacerbation P:   Continue pressure support as he can tolerate. Now tolerating pressure support 5. Getting closer to extubation although he is actively wheezing Intermittent chest x-ray Scheduled DuoNeb and budesonide  CARDIOVASCULAR A:  Hypotension likely secondary to severe sepsis with component of hypovolemia. Lactic very mildly  elevated.  PAF/Aflutter   Bradycardia - into the 20's on 6/8 am, resolved.  ? Diltiazem vs reflex bradycardia with neo P:  Heparin drip per pharmacy Diltiazem drip currently at 15, titrate as able Amiodarone not available due to national shortage Home rate control on hold   RENAL A:   AKI, improving P:   Follow BMP and urine output Replace electrolytes as indicated Avoid nephrotoxins  GASTROINTESTINAL A:   At Risk Malnutrition  P:   Tube feeding PPI for SUP  HEMATOLOGIC A:   Anemia (basline Hgb 10.5) P:  Continue heparin prophylaxis Follow CBC  INFECTIOUS A:   Aspiration PNA Severe sepsis P:   Antibiotics as above Culture data negative so far  ENDOCRINE A:   DM P:   SSI  NEUROLOGIC A:   Acute metabolic encephalopathy Benzo Dependent at Baseline  P:   RASS goal: 0 to -1  Minimize sedation as able Home clonazepam on hold  FAMILY  - Updates: No family at bedside 63/10  - Inter-disciplinary family meet or Palliative Care meeting due by:  6/12  Independent critical care time is 31 minutes.   Baltazar Apo, MD, PhD 09/15/2016, 11:00 AM [facility] [phone] or if no answer [phone]

## 2016-09-15 NOTE — Progress Notes (Signed)
eLink Physician-Brief Progress Note Patient Name: Garrett Spencer DOB: 10/05/1945 MRN: [id number]   Date of Service  09/15/2016  HPI/Events of Note  Patient tachypneic and uncomfortable by nurse report.  Patient awake and alert.  He is calm but RR is elevated.  He is on 40% FIOs with sat 95%.  BP and HR stable.  Nurse reports abd is a bit firm.    eICU Interventions  Check abd film Increase versed prn Continue fentanyl drip     Intervention Category Intermediate Interventions: Abdominal pain - evaluation and management  Mauri Brooklyn, P 09/15/2016, 9:53 PM

## 2016-09-15 NOTE — Progress Notes (Signed)
ANTICOAGULATION CONSULT NOTE - Follow up [place] for Heparin Indication: atrial fibrillation  Allergies  Allergen Reactions  . Benadryl [Diphenhydramine]     unknown  . Haldol [Haloperidol] Other (See Comments)    unknown  . Shellfish Allergy Other (See Comments)    unknown    Patient Measurements: Weight: 161 lb 2.5 oz (73.1 kg) Heparin dosing weight: 67.6 kg  Vital Signs: Temp: 99.3 F (37.4 C) (06/10 1200) BP: 93/67 (06/10 1200) Pulse Rate: 112 (06/10 1542)  Labs:  Recent Labs  09/13/16 0258  09/14/16 0339 09/15/16 0432 09/15/16 1700  HGB 8.6*  --  7.9* 7.6*  --   HCT 28.8*  --  26.6* 24.9*  --   PLT 244  --  213 190  --   HEPARINUNFRC  --   < > 0.30 0.21* 0.23*  CREATININE 1.49*  --  1.33* 1.17  --   < > = values in this interval not displayed.  Estimated Creatinine Clearance: 60.7 mL/min (by C-G formula based on SCr of 1.17 mg/dL).  Reviewed, not on an anticoagulant per home med list.  Assessment: 71 yo male admitted with hypotension and AF with RVR. Pharmacy consulted to dose heparin. Heparin level remains subtherapeutic at 0.23 on 1050 units/hr. Hgb low stable, PLT stable. No bleeding or IV line issues per RN.  Goal of Therapy:  Heparin level 0.3-0.7 units/ml Monitor platelets by anticoagulation protocol: Yes   Plan:  Increase heparin to 1200 units/hr 8h heparin level Daily heparin level/CBC Monitor for s/sx bleeding   Elicia Lamp, PharmD, BCPS Clinical Pharmacist 09/15/2016 5:44 PM

## 2016-09-15 NOTE — Progress Notes (Signed)
ANTICOAGULATION CONSULT NOTE - Follow Up Consult  Pharmacy Consult for Heparin  Indication: atrial fibrillation  Allergies  Allergen Reactions  . Benadryl [Diphenhydramine]     unknown  . Haldol [Haloperidol] Other (See Comments)    unknown  . Shellfish Allergy Other (See Comments)    unknown    Patient Measurements: Weight: 157 lb 10.1 oz (71.5 kg)  Vital Signs: Temp: 99.5 F (37.5 C) (06/10 0300) BP: 114/67 (06/10 0300) Pulse Rate: 119 (06/10 0300)  Labs:  Recent Labs  09/13/16 0258 09/13/16 0301 09/14/16 0339 09/15/16 0432  HGB 8.6*  --  7.9* 7.6*  HCT 28.8*  --  26.6* 24.9*  PLT 244  --  213 190  HEPARINUNFRC  --  0.53 0.30 0.21*  CREATININE 1.49*  --  1.33* 1.17    Estimated Creatinine Clearance: 59.4 mL/min (by C-G formula based on SCr of 1.17 mg/dL).  Assessment: 71 y/o M on heparin for afib, heparin level low this AM, no issues per RN.   Goal of Therapy:  Heparin level 0.3-0.7 units/ml Monitor platelets by anticoagulation protocol: Yes   Plan:  -Inc heparin to 1050 units/hr -1400 HL  Dolton Shaker 09/15/2016,5:47 AM

## 2016-09-15 NOTE — Progress Notes (Signed)
Called to room to evaluate patient having high anxiety and increased WOB. Anxious all night, patient at this time looked worse and had now expiratory wheezes bilaterally in the upper airways. Good movement of air into the lungs. Given PRN dose of albuterol and patient continues to work but looks better and less anxious. BBS still heard with some exp wheezes continuing post treatment. Patient was having no wheezes all night and seems to of possibly worked himself up through his anxiety. RN gave PRN dose of Versed per Md order and the mixture of all the work and treatments has patient calmer and more relaxed at this time.

## 2016-09-16 ENCOUNTER — Ambulatory Visit: Payer: Medicare Other | Admitting: Adult Health

## 2016-09-16 ENCOUNTER — Inpatient Hospital Stay (HOSPITAL_COMMUNITY): Payer: Medicare Other

## 2016-09-16 DIAGNOSIS — E559 Vitamin D deficiency, unspecified: Secondary | ICD-10-CM | POA: Diagnosis not present

## 2016-09-16 DIAGNOSIS — F319 Bipolar disorder, unspecified: Secondary | ICD-10-CM | POA: Diagnosis not present

## 2016-09-16 DIAGNOSIS — M6281 Muscle weakness (generalized): Secondary | ICD-10-CM | POA: Diagnosis not present

## 2016-09-16 DIAGNOSIS — K219 Gastro-esophageal reflux disease without esophagitis: Secondary | ICD-10-CM | POA: Diagnosis not present

## 2016-09-16 LAB — GLUCOSE, CAPILLARY
GLUCOSE-CAPILLARY: 157 mg/dL — AB (ref 65–99)
GLUCOSE-CAPILLARY: 108 mg/dL — AB (ref 65–99)
GLUCOSE-CAPILLARY: 120 mg/dL — AB (ref 65–99)
Glucose-Capillary: 143 mg/dL — ABNORMAL HIGH (ref 65–99)
Glucose-Capillary: 114 mg/dL — ABNORMAL HIGH (ref 65–99)

## 2016-09-16 LAB — CBC
HCT: 29.3 % — ABNORMAL LOW (ref 39.0–52.0)
HEMATOCRIT: 21.9 % — AB (ref 39.0–52.0)
Hemoglobin: 6.8 g/dL — CL (ref 13.0–17.0)
Hemoglobin: 9.3 g/dL — ABNORMAL LOW (ref 13.0–17.0)
MCH: 28.2 pg (ref 26.0–34.0)
MCH: 28.2 pg (ref 26.0–34.0)
MCHC: 31.1 g/dL (ref 30.0–36.0)
MCHC: 31.7 g/dL (ref 30.0–36.0)
MCV: 90.9 fL (ref 78.0–100.0)
MCV: 88.8 fL (ref 78.0–100.0)
PLATELETS: 180 10*3/uL (ref 150–400)
Platelets: 154 10*3/uL (ref 150–400)
RBC: 2.41 MIL/uL — ABNORMAL LOW (ref 4.22–5.81)
RBC: 3.3 MIL/uL — AB (ref 4.22–5.81)
RDW: 17.8 % — AB (ref 11.5–15.5)
RDW: 17.2 % — AB (ref 11.5–15.5)
WBC: 8.6 10*3/uL (ref 4.0–10.5)
WBC: 11.8 10*3/uL — AB (ref 4.0–10.5)

## 2016-09-16 LAB — BASIC METABOLIC PANEL
ANION GAP: 7 (ref 5–15)
BUN: 42 mg/dL — ABNORMAL HIGH (ref 6–20)
CALCIUM: 7.4 mg/dL — AB (ref 8.9–10.3)
CO2: 31 mmol/L (ref 22–32)
Chloride: 105 mmol/L (ref 101–111)
Creatinine, Ser: 1.16 mg/dL (ref 0.61–1.24)
GFR calc Af Amer: >60 mL/min (ref >60)
GFR calc non Af Amer: >60 mL/min (ref >60)
GLUCOSE: 112 mg/dL — AB (ref 65–99)
POTASSIUM: 3.2 mmol/L — AB (ref 3.5–5.1)
Sodium: 143 mmol/L (ref 135–145)

## 2016-09-16 LAB — HEPARIN LEVEL (UNFRACTIONATED)
HEPARIN UNFRACTIONATED: 0.53 [IU]/mL (ref 0.30–0.70)
Heparin Unfractionated: 0.46 IU/mL (ref 0.30–0.70)

## 2016-09-16 LAB — CULTURE, BLOOD (ROUTINE X 2)
CULTURE: NO GROWTH
Culture: NO GROWTH

## 2016-09-16 LAB — PREPARE RBC (CROSSMATCH)

## 2016-09-16 LAB — MAGNESIUM: Magnesium: 1.5 mg/dL — ABNORMAL LOW (ref 1.7–2.4)

## 2016-09-16 LAB — ABO/RH: ABO/RH(D): B POS

## 2016-09-16 MED ORDER — MORPHINE SULFATE (PF) 2 MG/ML IV SOLN
2.0000 mg | Freq: Once | INTRAVENOUS | Status: AC
  Administered 2016-09-16: 2 mg via INTRAVENOUS
  Filled 2016-09-16: qty 1

## 2016-09-16 MED ORDER — VECURONIUM BROMIDE 10 MG IV SOLR
INTRAVENOUS | Status: AC
  Filled 2016-09-16: qty 10

## 2016-09-16 MED ORDER — SODIUM CHLORIDE 0.9 % IV SOLN
1.0000 mg/h | INTRAVENOUS | Status: DC
  Administered 2016-09-16: 2 mg/h via INTRAVENOUS
  Filled 2016-09-16 (×2): qty 10

## 2016-09-16 MED ORDER — POTASSIUM CHLORIDE 20 MEQ/15ML (10%) PO SOLN
40.0000 meq | Freq: Once | ORAL | Status: AC
  Administered 2016-09-16: 40 meq
  Filled 2016-09-16: qty 30

## 2016-09-16 MED ORDER — MORPHINE BOLUS VIA INFUSION
4.0000 mg | INTRAVENOUS | Status: DC | PRN
  Administered 2016-09-16 – 2016-09-17 (×15): 4 mg via INTRAVENOUS
  Filled 2016-09-16: qty 4

## 2016-09-16 MED ORDER — SODIUM CHLORIDE 0.9 % IV SOLN
Freq: Once | INTRAVENOUS | Status: AC
  Administered 2016-09-16: 09:00:00 via INTRAVENOUS

## 2016-09-16 NOTE — Progress Notes (Signed)
eLink Physician-Brief Progress Note Patient Name: LYNKIN SAINI DOB: 1945-08-12 MRN: [id number]   Date of Service  09/16/2016  HPI/Events of Note  In pain, on morphine at maxium dose Camera check: grimace, awake  eICU Interventions  Increase max dose to 10mg /hr     Intervention Category Major Interventions: End of life / care limitation discussion  Simonne Maffucci 09/16/2016, 5:46 PM

## 2016-09-16 NOTE — Progress Notes (Deleted)
**Note Garrett-Identified via Obfuscation** Cardiology Office Note   Date:  09/16/2016   ID:  Garrett Spencer, DOB 01-07-1946, MRN [id number]  PCP:  Garrett Burly, MD  Cardiologist:   No chief complaint on file.     History of Present Illness: Garrett Spencer is Spencer 71 y.o. male who presents for ***    Past Medical History:  Diagnosis Date  . Anxiety   . Arthritis   . Asthma   . Bipolar disorder ([place])   . BPH with obstruction/lower urinary tract symptoms   . Cancer ([place])   . Cognitive communication deficit   . Constipation   . COPD (chronic obstructive pulmonary disease) ([place])   . Depression   . Diabetes mellitus without complication ([place])   . Diaphragmatic hernia   . Generalized weakness   . GERD (gastroesophageal reflux disease)   . Hepatitis C   . History of pulmonary embolism   . History of shingles   . HOH (hard of hearing)   . Hyperlipidemia   . Hypertension   . Hypocalcemia   . Insomnia   . Pneumonia   . Polyneuropathy   . Torn rotator cuff    left  . Tremors of nervous system   . Vitamin D deficiency     Past Surgical History:  Procedure Laterality Date  . CATARACT EXTRACTION W/PHACO Left 12/19/2014   Procedure: CATARACT EXTRACTION PHACO AND INTRAOCULAR LENS PLACEMENT (IOC);  Surgeon: Garrett Branch, MD;  Location: [facility];  Service: Ophthalmology;  Laterality: Left;  CDE: 19.97  . CATARACT EXTRACTION W/PHACO Right 02/20/2015   Procedure: CATARACT EXTRACTION PHACO AND INTRAOCULAR LENS PLACEMENT RIGHT EYE;  Surgeon: Garrett Branch, MD;  Location: [facility];  Service: Ophthalmology;  Laterality: Right;  16.59  . ORIF FEMUR FRACTURE Left   . TONGUE SURGERY     removal of cancer  . TRANSURETHRAL RESECTION OF PROSTATE       No current facility-administered medications for this visit.    No current outpatient prescriptions on file.   Facility-Administered Medications Ordered in Other Visits  Medication Dose Route Frequency Provider Last Rate Last Dose  . 0.9 %  sodium chloride infusion  250 mL Intravenous  PRN Garrett Harold, NP 20 mL/hr at 09/14/16 1900 250 mL at 09/14/16 1900  . 0.9 %  sodium chloride infusion   Intravenous Once Rigoberto Noel, MD      . acetaminophen (TYLENOL) solution 650 mg  650 mg Per Tube Q6H PRN Garrett Gens L, NP   650 mg at 09/14/16 1133  . albuterol (PROVENTIL) (2.5 MG/3ML) 0.083% nebulizer solution 2.5 mg  2.5 mg Nebulization Q2H PRN Garrett Harold, NP   2.5 mg at 09/15/16 0754  . bisacodyl (DULCOLAX) suppository 10 mg  10 mg Rectal Daily PRN Garrett Brooklyn, MD   10 mg at 09/15/16 2308  . budesonide (PULMICORT) nebulizer solution 0.5 mg  0.5 mg Nebulization BID Garrett Harold, NP   0.5 mg at 09/15/16 2122  . ceFEPIme (MAXIPIME) 2 g in dextrose 5 % 50 mL IVPB  2 g Intravenous Q8H Garrett Spencer, [place]   Stopped at 09/16/16 0606  . chlorhexidine gluconate (MEDLINE KIT) (PERIDEX) 0.12 % solution 15 mL  15 mL Mouth Rinse BID Garrett Spencer, [place] A, MD   15 mL at 09/15/16 1938  . Chlorhexidine Gluconate Cloth 2 % PADS 6 each  6 each Topical Q0600 Garrett Spencer, Garrett Gip, MD   6 each at 09/16/16 0600  . diltiazem (CARDIZEM)  100 mg in dextrose 5 % 100 mL (1 mg/mL) infusion  5-15 mg/hr Intravenous Titrated Brand Males, MD 5 mL/hr at 09/16/16 0648 5 mg/hr at 09/16/16 0648  . feeding supplement (VITAL AF 1.2 CAL) liquid 1,000 mL  1,000 mL Per Tube Continuous Garrett Spencer, Garrett Rafter A, MD 70 mL/hr at 09/15/16 1806 1,000 mL at 09/15/16 1806  . fentaNYL 2554mg in NS 2559m(101mml) infusion-PREMIX  25-400 mcg/hr Intravenous Continuous Garrett Spencer, Garrett Bay MD 40 mL/hr at 09/16/16 0647 400 mcg/hr at 09/16/16 0647  . heparin ADULT infusion 100 units/mL (25000 units/250m54mdium chloride 0.45%)  1,200 Units/hr Intravenous Continuous BairRomona CurlsH 12 mL/hr at 09/15/16 1900 1,200 Units/hr at 09/15/16 1900  . insulin aspart (novoLOG) injection 2-6 Units  2-6 Units Subcutaneous Q4H HoffCorey Spencer   4 Units at 09/16/16 0355  . ipratropium-albuterol (DUONEB) 0.5-2.5 (3) MG/3ML  nebulizer solution 3 mL  3 mL Nebulization TID Garrett Spencer, Garrett Spencer   3 mL at 09/15/16 2122  . MEDLINE mouth rinse  15 mL Mouth Rinse QID Garrett Spencer, JoseKirkersvilleMD   15 mL at 09/16/16 0355  . midazolam (VERSED) injection 2 mg  2 mg Intravenous Q1H PRN SmitMauri Spencer   2 mg at 09/16/16 0536  . pantoprazole (PROTONIX) injection 40 mg  40 mg Intravenous QHS HoffCorey Spencer   40 mg at 09/15/16 2200  . phenylephrine (NEO-SYNEPHRINE) 40 mg in sodium chloride 0.9 % 250 mL (0.16 mg/mL) infusion  0-400 mcg/min Intravenous Titrated Garrett Spencer, JoseBradshawMD 9.4 mL/hr at 09/13/16 1110 25 mcg/min at 09/13/16 1110    Allergies:   Benadryl [diphenhydramine]; Haldol [haloperidol]; and Shellfish allergy    Social History:  The patient  reports that he quit smoking about 3 years ago. His smoking use included Cigarettes. He has Spencer 67.50 pack-year smoking history. He has never used smokeless tobacco. He reports that he does not drink alcohol or use drugs.   Family History:  The patient's family history is not on file.    ROS: All other systems are reviewed and negative. Unless otherwise mentioned in H&P    PHYSICAL EXAM: VS:  There were no vitals taken for this visit. , BMI There is no height or weight on file to calculate BMI. GEN: Well nourished, well developed, in no acute distress HEENT: normal Neck: no JVD, carotid bruits, or masses Cardiac: ***RRR; no murmurs, rubs, or gallops,no edema  Respiratory:  clear to auscultation bilaterally, normal work of breathing GI: soft, nontender, nondistended, + BS MS: no deformity or atrophy Skin: warm and dry, no rash Neuro:  Strength and sensation are intact Psych: euthymic mood, full affect   EKG:  EKG {ACTION; IS/IS NOT:[id number]}ered today. The ekg ordered today demonstrates ***   Recent Labs: 08/28/2016: B Natriuretic Peptide 61.0 09/11/2016: ALT 39 09/12/2016: TSH 1.403 09/16/2016: BUN 42; Creatinine, Ser 1.16; Hemoglobin 6.8; Magnesium  1.5; Platelets 154; Potassium 3.2; Sodium 143    Lipid Panel    Component Value Date/Time   TRIG 77 09/12/2016 1921      Wt Readings from Last 3 Encounters:  09/16/16 157 lb 6.5 oz (71.4 kg)  09/09/16 149 lb (67.6 kg)  09/02/16 149 lb 7.6 oz (67.8 kg)      Other studies Reviewed: Additional studies/ records that were reviewed today include: ***. Review of the above records demonstrates: ***   ASSESSMENT AND PLAN:  1.  ***  Current medicines are reviewed at length with the patient today.    Labs/ tests ordered today include: *** Phill Myron. [place] Pugh, ANP, AACC   09/16/2016 7:24 AM    [facility] [address], [place], [place] [address] Phone: [phone]; Fax: [phone]

## 2016-09-16 NOTE — Progress Notes (Signed)
PULMONARY / CRITICAL CARE MEDICINE   Name: Garrett Spencer MRN: [id number] DOB: [birth year]    ADMISSION DATE:  09/11/2016 CONSULTATION DATE:  6/6  REFERRING MD:  Dr. Anitra Lauth [facility] EDP  CHIEF COMPLAINT:  VDRF  BRIEF SUMMARY:   71 year old male with PMH of COPD, DM, Hepatitis C, HTN, Tremors, and Bipolar disorder. He was recently admitted 5/22-5/29 for hypoxemic respiratory failure due to COPD exacerbation treated with IV steroids and nebulized bronchodilators. Course complicated by atrial tachycardia ? MAT treated with beta blockers. He was discharged to SNF. He was seen in ED 6/4 for hypotension, but once he woke up his BP was 103/64 and he was discharged to SNF.   6/6 he was found minimally responsive at SNF with evidence of aspiration of emesis. He required BVM ventilation en route and was intubated in ED for airway protection. Initially he was hypotensive and in AF with RVR, but once airway was established and IVF was started he converted back into NSR.  He was started on antibiotics for HCAP. PCCM asked to admit to [facility] for ICU care.  Admitted 6/6.  Early am 6/8, he had a bradycardic episode into the 20's.     SUBJECTIVE:   Remains on low-dose diltiazem drip Tolerating pressure support wean today More awake    VITAL SIGNS: BP 114/85   Pulse (!) 114   Temp 99.5 F (37.5 C)   Resp (!) 28   Wt 71.4 kg (157 lb 6.5 oz)   SpO2 98%   BMI 21.95 kg/m   HEMODYNAMICS:    VENTILATOR SETTINGS: Vent Mode: CPAP;PSV FiO2 (%):  [40 %] 40 % Set Rate:  [18 bmp] 18 bmp Vt Set:  [500 mL-550 mL] 500 mL PEEP:  [5 cmH20] 5 cmH20 Pressure Support:  [8 cmH20] 8 cmH20 Plateau Pressure:  [14 cmH20-19 cmH20] 14 cmH20  INTAKE / OUTPUT: I/O last 3 completed shifts: In: 5309.4 [I.V.:2589.4; NG/GT:2520; IV Piggyback:200] Out: 7340 [Urine:7340]  PHYSICAL EXAMINATION: General: Elderly man on mechanical ventilation, tolerating pressure support HEENT: ET tube in place, oropharynx  clear Neuro: Awake, eyes open, not to questions, follows commands, somewhat globally weak CV: Irregularly irregular, heart rate 90s (diltiazem running) PULM: Distant, prolonged expiratory phase, scattered expiratory wheezes are improved GI: Soft, benign, positive bowel sounds Extremities: No edema Skin: Some bilateral skin tears on the upper ext   LABS:  BMET  Recent Labs Lab 09/14/16 0339 09/15/16 0432 09/16/16 0200  NA 142 142 143  K 4.2 3.6 3.2*  CL 111 108 105  CO2 23 27 31   BUN 34* 37* 42*  CREATININE 1.33* 1.17 1.16  GLUCOSE 99 149* 112*    Electrolytes  Recent Labs Lab 09/13/16 1412 09/13/16 1841 09/14/16 0339 09/15/16 0432 09/16/16 0200  CALCIUM  --   --  7.4* 7.7* 7.4*  MG 2.0 1.8 2.0  --  1.5*  PHOS 2.8 2.6 2.5  --   --     CBC  Recent Labs Lab 09/14/16 0339 09/15/16 0432 09/16/16 0200  WBC 15.1* 14.4* 8.6  HGB 7.9* 7.6* 6.8*  HCT 26.6* 24.9* 21.9*  PLT 213 190 154    Coag's  Recent Labs Lab 09/11/16 2125  APTT 26  INR 1.01    Sepsis Markers  Recent Labs Lab 09/11/16 1732 09/11/16 2035 09/12/16 0246 09/13/16 0258 09/13/16 0300 09/14/16 0339  LATICACIDVEN 2.17* 2.03*  --   --  1.0  --   PROCALCITON  --   --  3.77  5.57  --  3.65    ABG  Recent Labs Lab 09/12/16 0545 09/12/16 0824 09/12/16 1650  PHART 7.269* 7.279* 7.285*  PCO2ART 56.4* 51.8* 49.8*  PO2ART 111* 79.0* 81.0*    Liver Enzymes  Recent Labs Lab 09/10/16 0040 09/11/16 1715  AST 30 35  ALT 41 39  ALKPHOS 54 59  BILITOT 0.7 0.3  ALBUMIN 3.1* 2.9*    Cardiac Enzymes  Recent Labs Lab 09/10/16 0040 09/11/16 1743  TROPONINI <0.03 0.03*    Glucose  Recent Labs Lab 09/15/16 1701 09/15/16 1931 09/15/16 2336 09/16/16 0320 09/16/16 0815 09/16/16 1141  GLUCAP 154* 169* 155* 157* 143* 108*    Imaging Dg Chest Port 1 View  Result Date: 09/16/2016 CLINICAL DATA:  Acute respiratory failure EXAM: PORTABLE CHEST 1 VIEW COMPARISON:   09/15/2016, 09/13/2016 FINDINGS: Endotracheal tube tip is approximately 4.8 cm superior to the carina. Left-sided centric the anus catheter tip overlies the SVC confluence. The lungs are hyperinflated. Right apical pleural and parenchymal scarring. Overall no significant interval change in small left pleural effusion and bilateral lower lung infiltrates. Stable cardiomediastinal silhouette with atherosclerosis. IMPRESSION: 1. Support lines and tubes as above 2. No significant interval change in radiographic appearance of the chest compared to the prior study. Electronically Signed   By: Donavan Foil M.D.   On: 09/16/2016 03:58   Dg Abd Portable 1v  Result Date: 09/15/2016 CLINICAL DATA:  Abdominal pain. EXAM: PORTABLE ABDOMEN - 1 VIEW COMPARISON:  09/12/2016 FINDINGS: Nasogastric tube is looped in the stomach with the tip at the gastric body. Large colonic gas in stool throughout the colon. No gaseous distension of small bowel loops. Low pelvis excluded. No gross free intraperitoneal air. Bibasilar airspace disease. IMPRESSION: Gas and stool throughout the colon may represent adynamic ileus or distal obstruction. No small bowel dilatation identified. Nasogastric again looped in the stomach. Electronically Signed   By: Abigail Miyamoto M.D.   On: 09/15/2016 22:42     STUDIES:  6/6 CT head >> Mild diffuse cortical atrophy. Mild chronic ischemic white matter disease. No acute intracranial abnormality seen.  CULTURES: Blood 6/6 >> Urine 6/6 >> negative Sputum 6/7 >> normal flora  ANTIBIOTICS: Cefepime 6/6 >> Vanco 6/6 >> 6/9  SIGNIFICANT EVENTS: 6/06  Admit with aspiration / HCAP, intubated  6/08  Early am episode of brady to 20's, resolved  LINES/TUBES: ETT 6/6 >> L IJ TLC 6/7 >>   DISCUSSION: 71 year old male admitted for aspiration/HCAP. Intubated for airway protection. Hypotensive responded to volume. A flutter on monitor with RVR.   ASSESSMENT / PLAN:  PULMONARY A: Acute hypoxemic  respiratory failure secondary to aspiration COPD without acute exacerbation P:   Plan to attempt extubation today Follow chest x-ray Continue scheduled DuoNeb and budesonide Antibiotics as below  CARDIOVASCULAR A:  Hypotension likely secondary to severe sepsis with component of hypovolemia. Lactic very mildly elevated.  PAF/Aflutter   Bradycardia - into the 20's on 6/8 am, resolved.  ? Diltiazem vs reflex bradycardia with neo P:  Currently on heparin drip Remains on diltiazem, we will attempt to wean to off. May need to add back enteral rate control in order to accomplish Amiodarone not currently available due to national shortage   RENAL A:   AKI, improved P:   Follow BMP and urine output He has tolerated some gentle diuresis, overall greater than 8 L positive for hospitalization. Continue as able Replace electrolytes as indicated Avoid nephrotoxins  GASTROINTESTINAL A:   At Risk Malnutrition  P:   Assessment swallowing evaluation once extubated PPI for SUP  HEMATOLOGIC A:   Anemia (basline Hgb 10.5) P:  Heparin infusion for atrial fibrillation Follow CBC  INFECTIOUS A:   Aspiration PNA Severe sepsis P:   Day 6 of 8 antibiotics Cultures negative  ENDOCRINE A:   DM P:   Sliding scale insulin  NEUROLOGIC A:   Acute metabolic encephalopathy Benzo Dependent at Baseline  P:   RASS goal: 0  Discontinue sedation and worked for extubation Restart low-dose clonazepam when he is stable post extubation and able to take by mouth    FAMILY  - Updates: No family at bedside 6/10 or 6/11  - Inter-disciplinary family meet or Palliative Care meeting due by:  6/12  Independent critical care time is 33 minutes.   Baltazar Apo, MD, PhD 09/16/2016, 12:38 PM [facility] [phone] or if no answer [phone]

## 2016-09-16 NOTE — Progress Notes (Signed)
Wasted 150 cc fentanyl in sink with Antoine Primas.

## 2016-09-16 NOTE — Progress Notes (Signed)
PT Cancellation Note  Patient Details Name: Garrett Spencer MRN: [id number] DOB: [birth year]   Cancelled Treatment:    Reason Eval/Treat Not Completed: Other (comment) (order received, pt not yet 4 hours post extubation and will attempt in PM as time allows)   [name] B [name] 09/16/2016, 11:22 AM  [phone]

## 2016-09-16 NOTE — Progress Notes (Signed)
eLink Physician-Brief Progress Note Patient Name: Garrett Spencer DOB: 04-16-1945 MRN: [id number]   Date of Service  09/16/2016  HPI/Events of Note  Increasing work of breathing despite morphine infusion Camera check: accessory muscle use, appears uncomfortable  eICU Interventions  Add bolus morphine via infusion, monitor, may need more     Intervention Category Major Interventions: End of life / care limitation discussion  Simonne Maffucci 09/16/2016, 8:42 PM

## 2016-09-16 NOTE — Progress Notes (Signed)
ANTICOAGULATION CONSULT NOTE - Follow Up Consult  Pharmacy Consult for Heparin  Indication: atrial fibrillation  Allergies  Allergen Reactions  . Benadryl [Diphenhydramine]     unknown  . Haldol [Haloperidol] Other (See Comments)    unknown  . Shellfish Allergy Other (See Comments)    unknown    Patient Measurements: Weight: 161 lb 2.5 oz (73.1 kg)  Vital Signs: Temp: 99.3 F (37.4 C) (06/11 0130) BP: 89/60 (06/11 0130) Pulse Rate: 86 (06/11 0130)  Labs:  Recent Labs  09/14/16 0339 09/15/16 0432 09/15/16 1700 09/16/16 0200  HGB 7.9* 7.6*  --  6.8*  HCT 26.6* 24.9*  --  21.9*  PLT 213 190  --  154  HEPARINUNFRC 0.30 0.21* 0.23* 0.46  CREATININE 1.33* 1.17  --  1.16    Estimated Creatinine Clearance: 61.3 mL/min (by C-G formula based on SCr of 1.16 mg/dL).  Assessment: 71 y/o M on heparin for afib, heparin level therapeutic x 1 after rate increase, MD aware of Hgb drop to 6.8, no obvious bleeding noted, getting 1 units PRBCs  Goal of Therapy:  Heparin level 0.3-0.7 units/ml Monitor platelets by anticoagulation protocol: Yes   Plan:  -Cont heparin at 1200 units/hr -1200 HL -Trend Hgb  Narda Bonds 09/16/2016,3:32 AM

## 2016-09-16 NOTE — Progress Notes (Signed)
eLink Physician-Brief Progress Note Patient Name: Garrett Spencer DOB: 1945/09/22 MRN: [id number]   Date of Service  09/16/2016  HPI/Events of Note  Hb drop to 6.8 from 7.6  eICU Interventions  Transfuse 1 U PRBC Hypokalemia -repleted      Intervention Category Intermediate Interventions: Bleeding - evaluation and treatment with blood products;Electrolyte abnormality - evaluation and management  Garrett Spencer V. 09/16/2016, 3:30 AM

## 2016-09-16 NOTE — Progress Notes (Signed)
PCCM Interval Note  I was able to reach the patient's half-brother Garrett Spencer by phone. I informed him that Garrett Spencer is critically ill, that I am concerned that he has impending resp failure. I explained that the patient has stated that he would not want to be reintubated, would not want prolonged support. I questioned whether he believed that these statements were consistent with Garrett Spencer's medical wishes. Garrett Spencer indicated that he believes that this is consistent with his brother's wishes.   I will place DNR orders on the chart.  Will reassess the patient, start low dose morphine gtt and titrate to correct his WOB. Suspect that he will transition to full comfort. I explained to Garrett Spencer that his brother may die today.   Additional CC time 20 minutes   Baltazar Apo, MD, PhD 09/16/2016, 2:53 PM [facility] [phone] or if no answer [phone]

## 2016-09-16 NOTE — Progress Notes (Signed)
RT contacted CCM.  Pt placed on Bipap through the vent due to increased WOB & RR per Dr. Lamonte Sakai.  RN notified.

## 2016-09-16 NOTE — Procedures (Signed)
Extubation Procedure Note  Patient Details:   Name: Garrett Spencer DOB: 1945-09-25 MRN: [id number]   Airway Documentation:     Evaluation  O2 sats: stable throughout Complications: No apparent complications Patient did tolerate procedure well. Bilateral Breath Sounds: Expiratory wheezes, Rhonchi   Yes   Positive cuff leak noted.  Pt placed on [place] 4 Lpm with humidity, no stridor noted.  Pt attempted IS but has a weak effort.  Bayard Beaver 09/16/2016, 10:46 AM

## 2016-09-16 NOTE — Progress Notes (Signed)
ANTICOAGULATION CONSULT NOTE - Follow Up Consult  Pharmacy Consult for Heparin  Indication: atrial fibrillation  Allergies  Allergen Reactions  . Benadryl [Diphenhydramine]     unknown  . Haldol [Haloperidol] Other (See Comments)    unknown  . Shellfish Allergy Other (See Comments)    unknown    Patient Measurements: Weight: 157 lb 6.5 oz (71.4 kg)  Vital Signs: Temp: 99.3 F (37.4 C) (06/11 1300) Temp Source: Core (Comment) (06/11 1200) BP: 114/85 (06/11 1215) Pulse Rate: 115 (06/11 1300)  Labs:  Recent Labs  09/14/16 0339 09/15/16 0432 09/15/16 1700 09/16/16 0200 09/16/16 1201  HGB 7.9* 7.6*  --  6.8*  --   HCT 26.6* 24.9*  --  21.9*  --   PLT 213 190  --  154  --   HEPARINUNFRC 0.30 0.21* 0.23* 0.46 0.53  CREATININE 1.33* 1.17  --  1.16  --     Estimated Creatinine Clearance: 59.8 mL/min (by C-G formula based on SCr of 1.16 mg/dL).  Assessment: 71 y/o M on heparin for afib, heparin level therapeutic x 2 after rate increase, MD aware of Hgb drop to 6.8, no obvious bleeding noted, getting 1 units PRBCs  Goal of Therapy:  Heparin level 0.3-0.7 units/ml Monitor platelets by anticoagulation protocol: Yes   Plan:  -Cont heparin at 1200 units/hr -Trend Hgb  Levester Fresh, PharmD, BCPS, BCCCP Clinical Pharmacist Clinical phone for 09/16/2016 from 7a-3:30p: [phone] If after 3:30p, please call main pharmacy at: [phone] 09/16/2016 1:11 PM

## 2016-09-16 NOTE — Progress Notes (Signed)
PCCM Progress note  Pt with increased WOB and dyspnea, tachypnea. Was extubated earlier today.  Prolonged exp phase, bilateral wheezes. He has received albuterol nebs x 2 without much change Placed on BiPAP  I will increase PS on his BiPAP now Give morphine x 1 to see how this affects his WOB  I spoke with him regarding re-intubation. He has expressed that he would not want to go back on MV. I clarified with him that he understands that he could die from his COPD given current status and without MV. He expressed understanding. I would like to also discuss with his brother, his recorded next of kin. Will attempt to call him.   I suspect that we may be approaching a transition to comfort care here.   Additional CC time 20 minutes.    Baltazar Apo, MD, PhD 09/16/2016, 2:16 PM [facility] [phone] or if no answer [phone]

## 2016-09-17 LAB — TYPE AND SCREEN
ABO/RH(D): B POS
Antibody Screen: NEGATIVE
UNIT DIVISION: 0

## 2016-09-17 LAB — BPAM RBC
Blood Product Expiration Date: 201806212359
ISSUE DATE / TIME: 201806111147
Unit Type and Rh: 7300

## 2016-09-17 MED ORDER — SODIUM CHLORIDE 0.9 % IV SOLN
1.0000 mg/h | INTRAVENOUS | Status: DC
  Administered 2016-09-17: 4 mg/h via INTRAVENOUS
  Administered 2016-09-17 – 2016-09-18 (×3): 6 mg/h via INTRAVENOUS
  Filled 2016-09-17 (×4): qty 2.5

## 2016-09-17 MED ORDER — LORAZEPAM 2 MG/ML IJ SOLN
1.0000 mg | INTRAMUSCULAR | Status: DC | PRN
  Administered 2016-09-18: 1 mg via INTRAVENOUS
  Administered 2016-09-19 (×2): 2 mg via INTRAVENOUS
  Filled 2016-09-17 (×4): qty 1

## 2016-09-17 NOTE — Progress Notes (Signed)
SLP Cancellation Note  Patient Details Name: Garrett Spencer MRN: [id number] DOB: 07/27/1945   Cancelled treatment:       Reason Eval/Treat Not Completed: Medical issues which prohibited therapy; transitioned to comfort care.  Our services will sign off.   [name] 09/17/2016, 10:16 AM

## 2016-09-17 NOTE — Progress Notes (Signed)
Nutrition Brief Note  Chart reviewed. Pt now transitioning to comfort care.  No further nutrition interventions warranted at this time.  Please re-consult as needed.   Aidian Salomon A. Anum Palecek, RD, LDN, CDE Pager: [phone] After hours Pager: [phone]  

## 2016-09-17 NOTE — Progress Notes (Signed)
PT Cancellation Note  Patient Details Name: Garrett Spencer MRN: [id number] DOB: 1945/05/30   Cancelled Treatment:    Reason Eval/Treat Not Completed: Other (comment) (Pt for comfort care). Will sign off.   Shary Decamp [facility] 09/17/2016, 9:43 AM [place]

## 2016-09-17 NOTE — Care Management (Signed)
Pt transitioned to comfort care 

## 2016-09-17 NOTE — Progress Notes (Signed)
Wasted 225 cc fentanyl in sink with Optician, dispensing.

## 2016-09-17 NOTE — Progress Notes (Signed)
PULMONARY / CRITICAL CARE MEDICINE   Name: Garrett Spencer MRN: [id number] DOB: 16-Dec-1945    ADMISSION DATE:  09/11/2016 CONSULTATION DATE:  6/6  REFERRING MD:  Dr. Anitra Lauth [facility] EDP  CHIEF COMPLAINT:  VDRF  BRIEF SUMMARY:   71 year old male with PMH of COPD, DM, Hepatitis C, HTN, Tremors, and Bipolar disorder. He was recently admitted 5/22-5/29 for hypoxemic respiratory failure due to COPD exacerbation treated with IV steroids and nebulized bronchodilators. Course complicated by atrial tachycardia ? MAT treated with beta blockers. He was discharged to SNF. He was seen in ED 6/4 for hypotension, but once he woke up his BP was 103/64 and he was discharged to SNF.   6/6 he was found minimally responsive at SNF with evidence of aspiration of emesis. He required BVM ventilation en route and was intubated in ED for airway protection. Initially he was hypotensive and in AF with RVR, but once airway was established and IVF was started he converted back into NSR.  He was started on antibiotics for HCAP. PCCM asked to admit to [facility] for ICU care.  Admitted 6/6.  Early am 6/8, he had a bradycardic episode into the 20's.     SUBJECTIVE:   resp pattern is a bit better Remains uncomfortable, MSO4 at 10./h    VITAL SIGNS: BP 127/81   Pulse (!) 107   Temp 99 F (37.2 C)   Resp (!) 30   Wt 71.4 kg (157 lb 6.5 oz)   SpO2 99%   BMI 21.95 kg/m   HEMODYNAMICS:    VENTILATOR SETTINGS: Vent Mode: BIPAP;PCV FiO2 (%):  [30 %] 30 % PEEP:  [5 cmH20] 5 cmH20  INTAKE / OUTPUT: I/O last 3 completed shifts: In: 4163.3 [I.V.:2398.3; Blood:565; NG/GT:1050; IV Piggyback:150] Out: 2575 [Urine:2575]  PHYSICAL EXAMINATION: General: elderly man, on [place], scared, fatigued HEENT: OP clear, very weak voice Neuro: awake and globally weak CV: irregular HR 120's PULM: clear accessory muscle use, prolonged exp, bilateral wheezes.  GI: soft Extremities: No edema Skin: Some bilateral skin tears on  the upper ext   LABS:  BMET  Recent Labs Lab 09/14/16 0339 09/15/16 0432 09/16/16 0200  NA 142 142 143  K 4.2 3.6 3.2*  CL 111 108 105  CO2 23 27 31   BUN 34* 37* 42*  CREATININE 1.33* 1.17 1.16  GLUCOSE 99 149* 112*    Electrolytes  Recent Labs Lab 09/13/16 1412 09/13/16 1841 09/14/16 0339 09/15/16 0432 09/16/16 0200  CALCIUM  --   --  7.4* 7.7* 7.4*  MG 2.0 1.8 2.0  --  1.5*  PHOS 2.8 2.6 2.5  --   --     CBC  Recent Labs Lab 09/15/16 0432 09/16/16 0200 09/16/16 1654  WBC 14.4* 8.6 11.8*  HGB 7.6* 6.8* 9.3*  HCT 24.9* 21.9* 29.3*  PLT 190 154 180    Coag's  Recent Labs Lab 09/11/16 2125  APTT 26  INR 1.01    Sepsis Markers  Recent Labs Lab 09/11/16 1732 09/11/16 2035 09/12/16 0246 09/13/16 0258 09/13/16 0300 09/14/16 0339  LATICACIDVEN 2.17* 2.03*  --   --  1.0  --   PROCALCITON  --   --  3.77 5.57  --  3.65    ABG  Recent Labs Lab 09/12/16 0545 09/12/16 0824 09/12/16 1650  PHART 7.269* 7.279* 7.285*  PCO2ART 56.4* 51.8* 49.8*  PO2ART 111* 79.0* 81.0*    Liver Enzymes  Recent Labs Lab 09/11/16 1715  AST 35  ALT 39  ALKPHOS 59  BILITOT 0.3  ALBUMIN 2.9*    Cardiac Enzymes  Recent Labs Lab 09/11/16 1743  TROPONINI 0.03*    Glucose  Recent Labs Lab 09/15/16 2336 09/16/16 0320 09/16/16 0815 09/16/16 1141 09/16/16 1542 09/16/16 2017  GLUCAP 155* 157* 143* 108* 120* 114*    Imaging No results found.   STUDIES:  6/6 CT head >> Mild diffuse cortical atrophy. Mild chronic ischemic white matter disease. No acute intracranial abnormality seen.  CULTURES: Blood 6/6 >> Urine 6/6 >> negative Sputum 6/7 >> normal flora  ANTIBIOTICS: Cefepime 6/6 >> Vanco 6/6 >> 6/9  SIGNIFICANT EVENTS: 6/06  Admit with aspiration / HCAP, intubated  6/08  Early am episode of brady to 20's, resolved  LINES/TUBES: ETT 6/6 >> L IJ TLC 6/7 >>   DISCUSSION: 71 year old male admitted for aspiration/HCAP. Intubated  for airway protection. Hypotensive responded to volume. A flutter on monitor with RVR. Transitioned to comfort care after failing extubation 6/11  ASSESSMENT / PLAN:  PULMONARY A: Acute hypoxemic respiratory failure secondary to aspiration COPD without acute exacerbation P:   Failed extubation, no rebound on BiPAP. Transitioned to comfort care but still w clear accessory resp muscle use. Will transition morp[hine to dilaudid, add benzos prn abx stopped  CARDIOVASCULAR A:  Hypotension likely secondary to severe sepsis with component of hypovolemia. Lactic very mildly elevated.  PAF/Aflutter   Bradycardia - into the 20's on 6/8 am, resolved.  ? Diltiazem vs reflex bradycardia with neo P:  D/c heparin and diltiazem gtt's   RENAL A:   AKI, improved P:   Stop labs  GASTROINTESTINAL A:   At Risk Malnutrition  P:   NPO, swabs and ice chips / water for comfort  HEMATOLOGIC A:   Anemia (basline Hgb 10.5) P:  Off heparin gtt  INFECTIOUS A:   Aspiration PNA Severe sepsis P:   Stopped abx  ENDOCRINE A:   DM P:   No more CBG  NEUROLOGIC A:   Acute metabolic encephalopathy Benzo Dependent at Baseline  P:   RASS goal: 0  Convert morphine to dilaudid Add ativan prn    FAMILY  - Updates: discussed status and plans with Chet his brother on 6/11  - Inter-disciplinary family meet or Palliative Care meeting due by:  6/12  Move to floor bed on 6/12  Baltazar Apo, MD, PhD 09/17/2016, 11:44 AM [facility] [phone] or if no answer [phone]

## 2016-09-17 NOTE — Progress Notes (Signed)
Patient transferred off of 10M to 6N32.  Report called to [facility].  Patient's vitals within normal limits before transport.  Patient transferred to room by two nurses.

## 2016-09-17 NOTE — Progress Notes (Signed)
elink notified of pt being on cardizem & heparin gtt despite comfort care measures. RN given instructions to leave pt on current gtts but to not escalate care. Pt is a DNR and on comfort measures per MD orders.

## 2016-09-18 MED ORDER — HYDROMORPHONE BOLUS VIA INFUSION
0.2000 mg | INTRAVENOUS | Status: DC | PRN
  Administered 2016-09-19: 0.5 mg via INTRAVENOUS
  Filled 2016-09-18: qty 1

## 2016-09-18 MED ORDER — SODIUM CHLORIDE 0.9 % IV SOLN
1.0000 mg/h | INTRAVENOUS | Status: DC
  Administered 2016-09-18: 8 mg/h via INTRAVENOUS
  Administered 2016-09-18: 6 mg/h via INTRAVENOUS
  Administered 2016-09-18: 8 mg/h via INTRAVENOUS
  Administered 2016-09-19: 10 mg/h via INTRAVENOUS
  Administered 2016-09-19: 8 mg/h via INTRAVENOUS
  Filled 2016-09-18 (×5): qty 5

## 2016-09-18 MED ORDER — SODIUM CHLORIDE 0.9 % IV SOLN: 1.0000 mg/h | INTRAVENOUS | Status: DC

## 2016-09-18 MED ORDER — SODIUM CHLORIDE 0.9 % IV SOLN
1.0000 mg/h | INTRAVENOUS | Status: DC
  Filled 2016-09-18: qty 10

## 2016-09-18 NOTE — Progress Notes (Signed)
Wasted 42ml of Dilaudid drip witnessed by Evangeline Dakin

## 2016-09-18 NOTE — Progress Notes (Signed)
[facility] Attending Note  ADMISSION DATE:  09/11/2016  CONSULTATION DATE:  09/11/2016  REFERRING MD:  Dr. Roxanne Mins / Deneise Lever [facility] EDP  CHIEF COMPLAINT:  VDRF  Presenting HPI:  71 y.o. male with PMH of COPD, DM, Hepatitis C, HTN, Tremors, and Bipolar disorder. He was recently admitted 5/22-5/29 for hypoxemic respiratory failure due to COPD exacerbation treated with IV steroids and nebulized bronchodilators. Course complicated by atrial tachycardia ? MAT treated with beta blockers. He was discharged to SNF. He was seen in ED 6/4 for hypotension, but once he woke up his BP was 103/64 and he was discharged to SNF. On 6/6 he was found minimally responsive at SNF with evidence of aspiration of emesis. He required BVM ventilation en route and was intubated in ED for airway protection. Initially he was hypotensive and in AF with RVR, but once airway was established and IVF was started he converted back into NSR.  He was started on antibiotics for HCAP. PCCM asked to admit to [facility] for ICU care.  Admitted 6/6.  Early am 6/8, he had a bradycardic episode into the 20's.   Subjective: Patient successfully transitioned from morphine to Dilaudid drip. Drip rate increased this morning due to increased work of breathing and abdominal breathing as per nurse report. Unable to obtain additional history given patient's altered mentation.  Review of Systems:  Unable to obtain given altered mentation.   Temp:  [97.9 F (36.6 C)-99.1 F (37.3 C)] 97.9 F (36.6 C) (06/13 0538) Pulse Rate:  [108-142] 126 (06/13 0538) Resp:  [16-20] 20 (06/13 0538) BP: (126-146)/(94-101) 138/98 (06/13 0538) SpO2:  [92 %-100 %] 97 % (06/13 0847)  General:  No family at bedside. No distress. Integument:  Warm & dry. No rash on exposed skin. HEENT:  No scleral injection. Neurological:  No spontaneous movements. Eyes open. Doesn't respond to voice. Pulmonary:  Moderately increased work of breathing. Coarse  breath sounds bilaterally. Cardiovascular:  No appreciable JVD. Normal S1 & S2. Abdomen:  Soft. Protuberant. Normoactive bowel sounds.  LINES/TUBES: OETT 6/6 - 6/11 L IJ CVL 6/7 >>> PIV  CBC Latest Ref Rng & Units 09/16/2016 09/16/2016 09/15/2016  WBC 4.0 - 10.5 K/uL 11.8(H) 8.6 14.4(H)  Hemoglobin 13.0 - 17.0 g/dL 9.3(L) 6.8(LL) 7.6(L)  Hematocrit 39.0 - 52.0 % 29.3(L) 21.9(L) 24.9(L)  Platelets 150 - 400 K/uL 180 154 190   BMP Latest Ref Rng & Units 09/16/2016 09/15/2016 09/14/2016  Glucose 65 - 99 mg/dL 112(H) 149(H) 99  BUN 6 - 20 mg/dL 42(H) 37(H) 34(H)  Creatinine 0.61 - 1.24 mg/dL 1.16 1.17 1.33(H)  Sodium 135 - 145 mmol/L 143 142 142  Potassium 3.5 - 5.1 mmol/L 3.2(L) 3.6 4.2  Chloride 101 - 111 mmol/L 105 108 111  CO2 22 - 32 mmol/L 31 27 23   Calcium 8.9 - 10.3 mg/dL 7.4(L) 7.7(L) 7.4(L)    Hepatic Function Latest Ref Rng & Units 09/11/2016 09/10/2016  Total Protein 6.5 - 8.1 g/dL 5.5(L) 5.3(L)  Albumin 3.5 - 5.0 g/dL 2.9(L) 3.1(L)  AST 15 - 41 U/L 35 30  ALT 17 - 63 U/L 39 41  Alk Phosphatase 38 - 126 U/L 59 54  Total Bilirubin 0.3 - 1.2 mg/dL 0.3 0.7    IMAGING/STUDIES: CT HEAD W/O 6/6:  Mild diffuse cortical atrophy. Mild chronic ischemic white matter disease. No acute intracranial abnormality seen. PORT CXR 6/11:  Personally reviewed by me. No change in bilateral patchy alveolar opacities predominantly in the lower lobes. Silhouetting  of bilateral hemidiaphragms without clear pleural effusion. Endotracheal tube with tip in appropriate position. Enteric feeding tube coursing below diaphragm.  MICROBIOLOGY: MRSA PCR 5/23:  Negative  Blood Cultures x2 6/6:  Negative  Urine Culture 6/6:  Negative  MRSA PCR 6/7:  Negative  Tracheal Aspirate Culture 6/7:  Normal Respiratory Flora  ANTIBIOTICS: Vancomycin 6/6 - 6/9 Cefepime 6/6 - 6/12  SIGNIFICANT EVENTS: 06/06 - Admit w/ aspiration & intubated 06/08 - Early morning episode of bradycardia that resolved 06/12 -  Transitioned out of ICU and to floor status w/ comfort care >> morphine transitioned to dilaudid drip  ASSESSMENT/PLAN:  71 y.o. male with multiple medical problems admitted for acute hypoxic respiratory failure secondary to aspiration. Patient's hospital course has been complicated by hypotension and atrial flutter with rapid ventricular response. Patient was extubated on 6/11 but with worsening respiratory status and no improvement on noninvasive positive pressure ventilation patient was transitioned to full comfort care as per documentation and family discussion.   1. Acute hypoxic respiratory failure: Continuing minimal nasal cannulae oxygen therapy for patient comfort. Continuing Dilaudid drip for relief of work of breathing and pain. 2. Atrial flutter with rapid ventricular response: Heparin drip and diltiazem infusion discontinued. 3. Acute encephalopathy: Likely secondary to Dilaudid infusion. Patient appears largely comfortable. Ativan ordered as needed for any anxiety.  Diet:  NPO. Code Status:  Full DNR/DNI as per previous physician discussions. Disposition:  Patient to remain on floor status with goal of comfort care. Holding on transfer to [facility] as I do not believe patient will survive the next 24 hours. Family Update:  No family at bedside at the time of my rounds.  I have spent a total of 36 minutes of time today caring for the patient, reviewing the patient's electronic medical record, and with more than 50% of that time spent coordinating care with the patient as well as reviewing the continuing plan of care with the patient's nurse at bedside.  Sonia Baller Ashok Cordia, M.D. [facility] Pager:  [phone] After 3pm or if no response, call [phone] 11:04 AM 09/18/16

## 2016-09-18 NOTE — Progress Notes (Signed)
0830 Pt was able to say his first name this morning, responsive to verbal stimuli.  Increasing abdominal breathing, increased rate of Dilaudid infusion. 61 Pt's brother came in, updated of pt's status.

## 2016-09-19 MED ORDER — ATROPINE SULFATE 1 % OP SOLN
1.0000 [drp] | Freq: Four times a day (QID) | OPHTHALMIC | Status: DC | PRN
  Administered 2016-09-19: 2 [drp] via SUBLINGUAL
  Filled 2016-09-19: qty 5

## 2016-09-20 ENCOUNTER — Telehealth: Payer: Self-pay

## 2016-09-20 NOTE — Telephone Encounter (Signed)
On 09/20/16 I received a death certificate from [place] (orginal). The death certificate is for burial. The patient is a patient of [facility] [employer]. The death certificate will be taken to [facility] ([address]) this pm for signature.  On 09/29/16 I received the death certificate back from [place]. I got the death certificate ready and called the funeral home to let them know the death certificate is ready for pickup.

## 2016-10-06 NOTE — Discharge Summary (Signed)
DEATH NOTE: For a complete accounting of the patient's history and physical exam on presentation please refer to the H&P dictated on 09/11/16. In brief the patient was a 71 year old male with known history of COPD, diabetes mellitus, hepatitis C viral infection, hypertension, and bipolar disorder. Patient was hospitalized in May for hypoxic respiratory failure with a COPD exacerbation. Continuing patient was ultimately discharged to a local skilled nursing facility for further recovery and rehabilitation. Patient was subsequently seen on 09/09/16 in the emergency department for hypotension but with increased alertness his blood pressure improved and he was discharged to the skilled nursing facility again. On 6/6 the patient was found minimally responsive at his skilled nursing facility with evidence of aspiration emesis. He required bag mask ventilation en route to the emergency department. Ultimately he required endotracheal intubation for further airway protection. Initially the patient became hypotensive and went into atrial fibrillation with rapid ventricular response. He spontaneously converted back to normal sinus rhythm. The patient was started on broad-spectrum antibiotics with vancomycin and cefepime to cover for healthcare associated organisms. CT imaging of the head showed no acute intracranial abnormality. Cultures obtained yielded no identifiable infectious organism. Patient was ultimately treated with a seven-day course of cefepime and a on 6/12. The patient was successfully extubated 6/11 but required noninvasive positive pressure ventilation for respiratory support. With continually worsening respiratory status family discussion was held and the decision was made to transition to full comfort care without the desire for reintubation. The patient's morphine infusion was transitioned to Dilaudid due to excessive amounts required for patient comfort. He was transferred out of the intensive care unit to a  medical floor for continued palliation. Per documentation patient was found unresponsive without respiration, pulse, or blood pressure at 8:25 AM on 2016/09/20 by his nurse.  DIAGNOSES AT DEATH: 1. Acute  hypoxic respiratory failure 2. Hypotension 3. Paroxysmal atrial fibrillation/atrial flutter  4. Acute renal failure 5. Acute metabolic encephalopathy 6. Aspiration pneumonia with severe sepsis 7. History of COPD 8. Bipolar disorder 9. History of hyperlipidemia 10. History of hypertension 11. History of pulmonary embolism

## 2016-10-06 NOTE — Progress Notes (Signed)
Patient found unresponsive with no respirations, pulse or blood pressure. Time of death 33. Death was pronounced by Charlies Silvers, RN and Karren Burly, RN

## 2016-10-06 NOTE — Progress Notes (Signed)
Nursing Note: Wasted 120cc Dilaudid IV gtt with RN Arbie Cookey at [phone]

## 2016-10-06 NOTE — Progress Notes (Signed)
Witnessed Carol Huckabee waste 35ml of Dilaudid in sink.

## 2016-10-06 DEATH — deceased

## 2016-10-16 DIAGNOSIS — E559 Vitamin D deficiency, unspecified: Secondary | ICD-10-CM | POA: Diagnosis not present

## 2016-10-16 DIAGNOSIS — K219 Gastro-esophageal reflux disease without esophagitis: Secondary | ICD-10-CM | POA: Diagnosis not present

## 2016-10-16 DIAGNOSIS — M6281 Muscle weakness (generalized): Secondary | ICD-10-CM | POA: Diagnosis not present

## 2016-10-16 DIAGNOSIS — F319 Bipolar disorder, unspecified: Secondary | ICD-10-CM | POA: Diagnosis not present

## 2018-11-02 IMAGING — CR DG CHEST 1V PORT
1 series · 2 of 2 positions shown · non-contrast
Comparison: 08/29/2016 chest radiograph.

CLINICAL DATA: 71 y/o  M; respiratory distress.  History of COPD.

EXAM:
PORTABLE CHEST 1 VIEW

[Series 1: portable · 0.17mm/px · 2 of 2 slices shown]
[im 1/2]
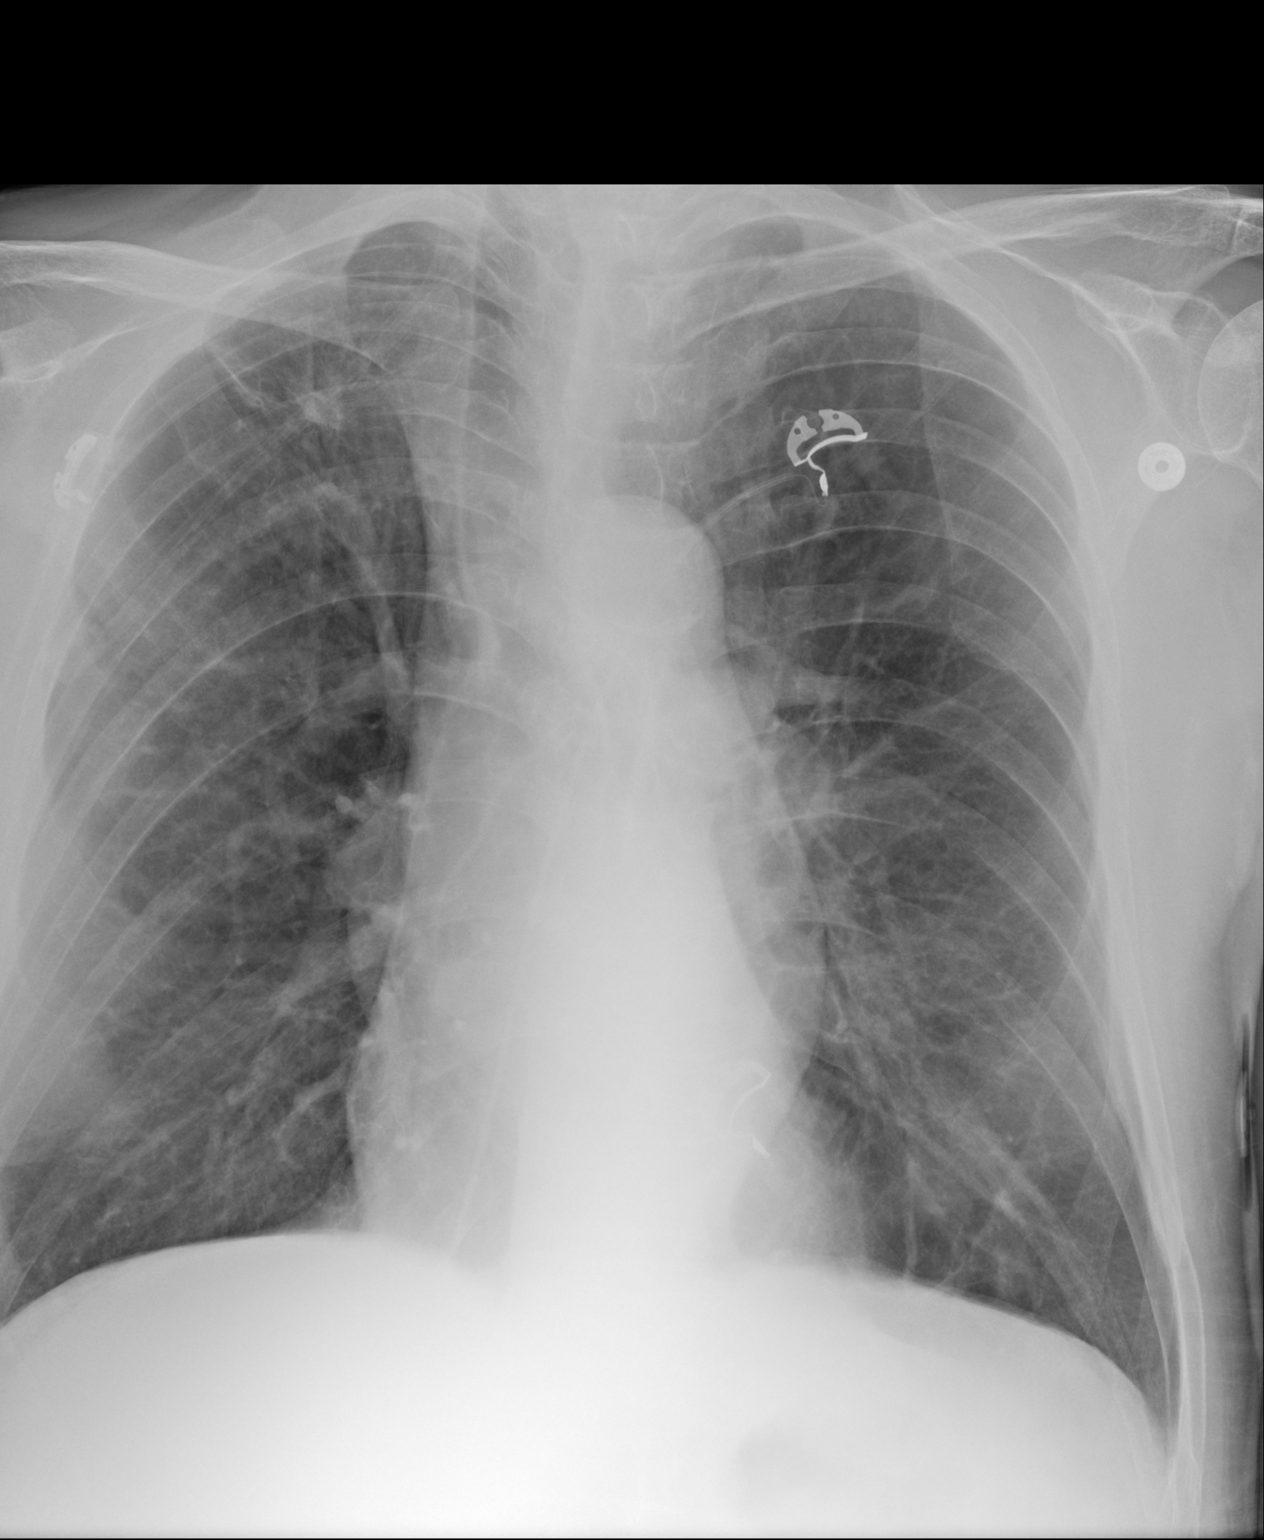
[im 2/2]
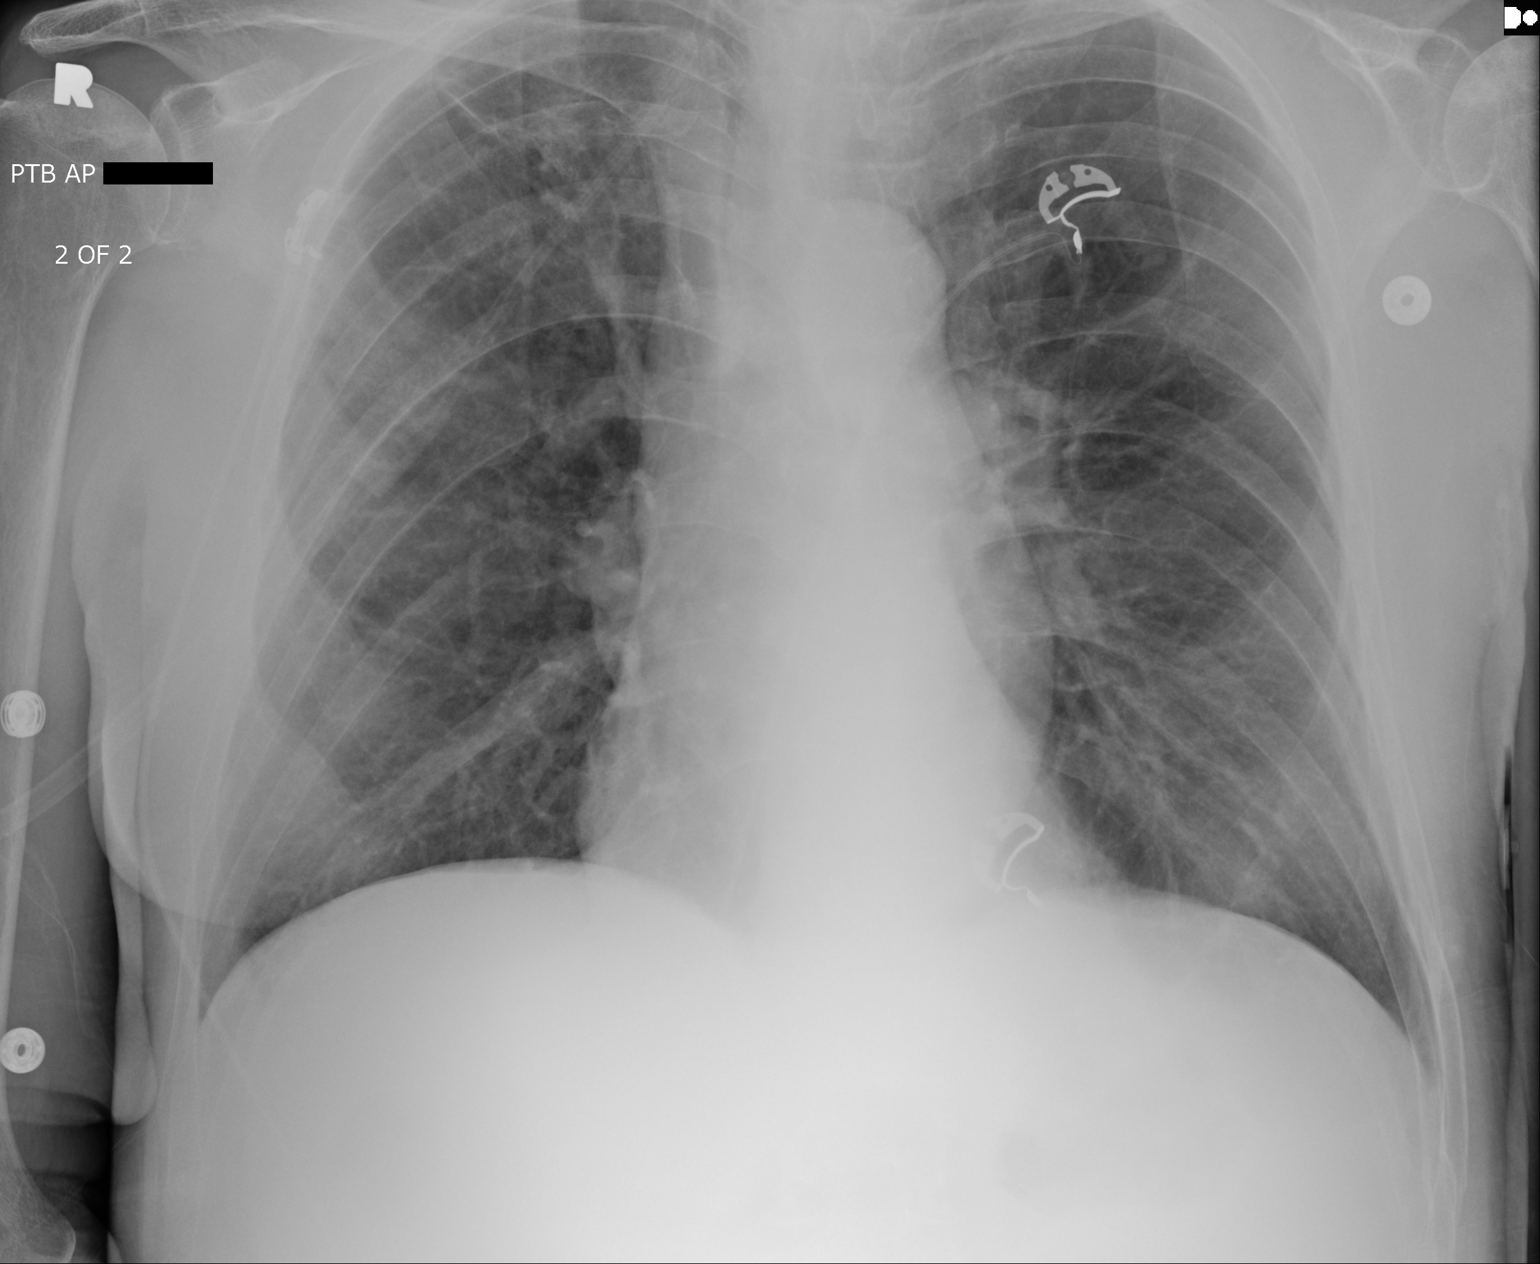

[2 of 2 positions shown; findings below may reference images not displayed]

FINDINGS: The heart size and mediastinal contours are within normal limits.
Stable scarring in the right lung apex. No focal consolidation,
effusion, or pneumothorax identified. Right humeral head subchondral
sclerosis is stable and compatible with avascular necrosis.
IMPRESSION: No active disease.

By: Yassine Delvalle M.D.

## 2018-11-15 IMAGING — CR DG CHEST 1V PORT
1 series · 1 of 1 positions shown · non-contrast
Comparison: 09/11/2016.  09/09/2016.

CLINICAL DATA: Shortness of breath.

EXAM:
PORTABLE CHEST 1 VIEW

[AP]
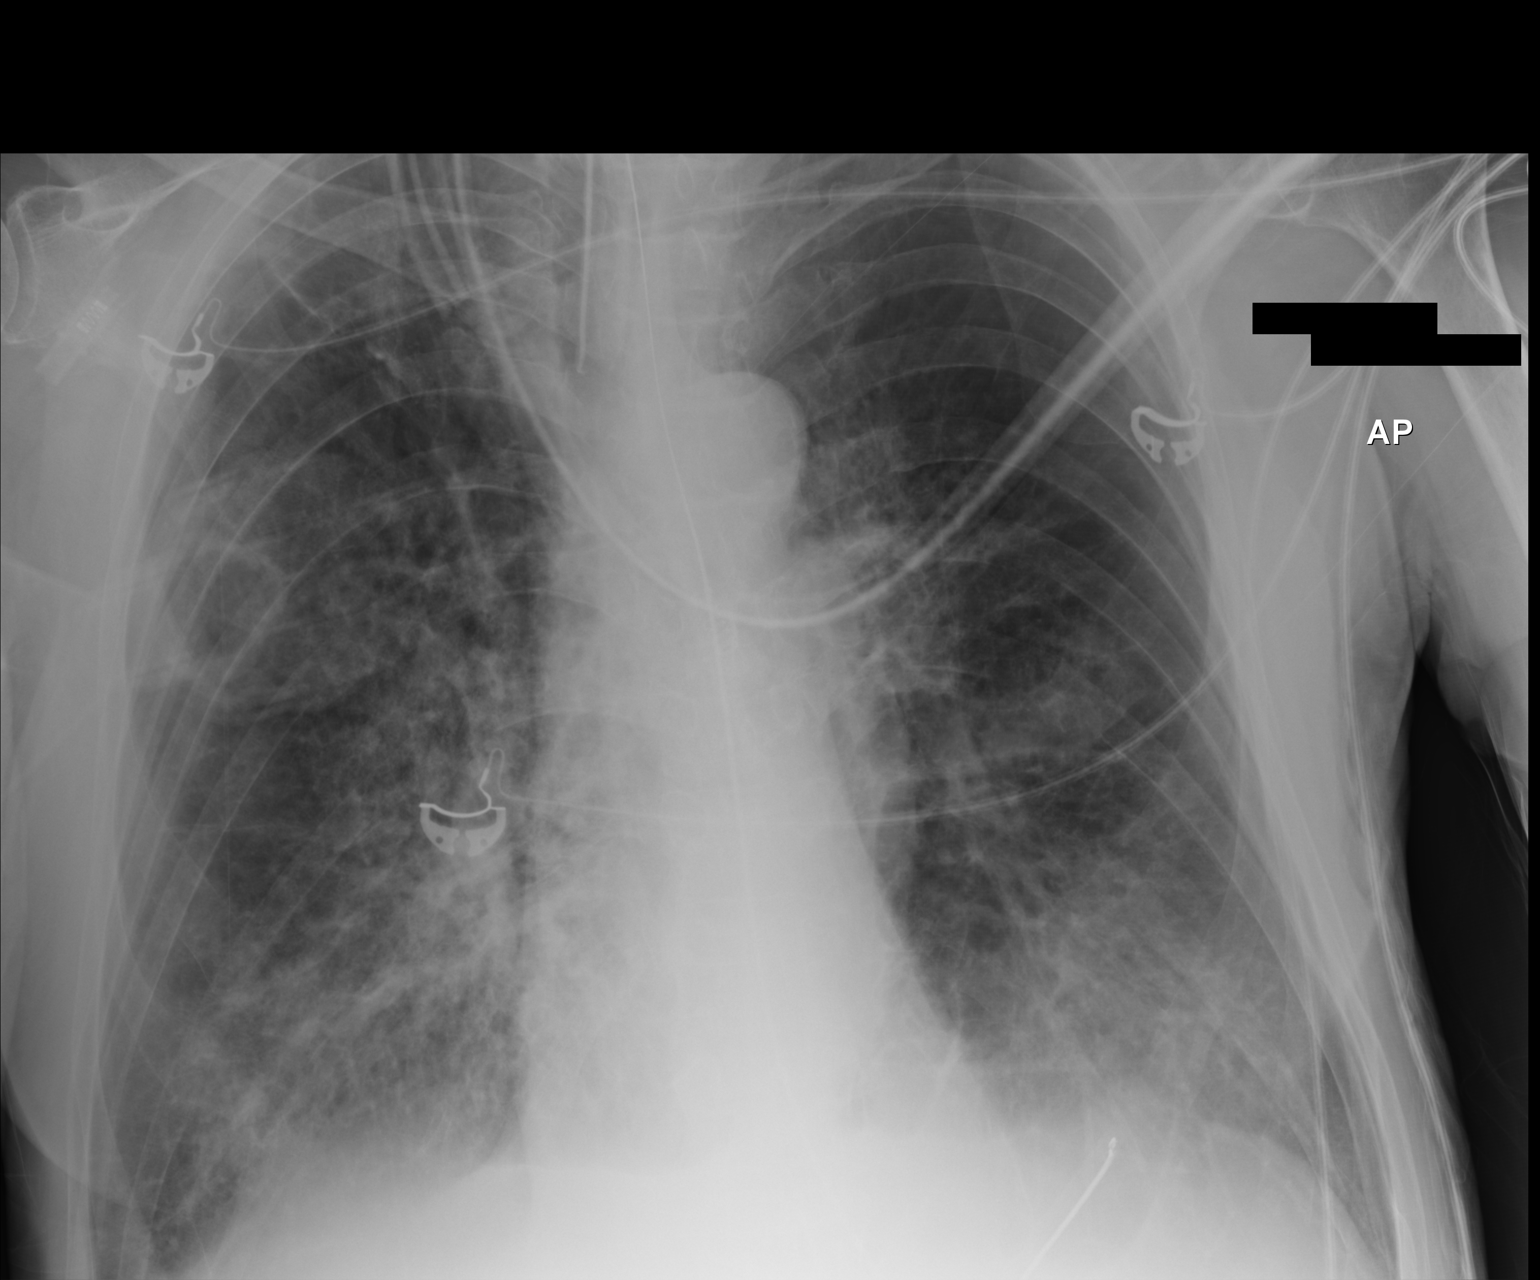

[1 of 1 positions shown; findings below may reference images not displayed]

FINDINGS: Endotracheal tube, NG tube in stable position. Heart size normal.
COPD . Diffuse bilateral pulmonary infiltrates and/or edema noted on
today's exam. Costophrenic angles are incompletely imaged. Small
bilateral pleural effusions cannot be excluded. No pneumothorax.
IMPRESSION: 1. Lines and tubes stable position.

2. Bilateral pulmonary infiltrates and or edema noted on today's
exam. Small bilateral pleural effusions cannot be excluded.

3.  COPD .
